# Patient Record
Sex: Male | Born: 1955
Health system: Southern US, Community
[De-identification: ages and names within clinical notes are randomized; demographics above are authoritative.]

## PROBLEM LIST (undated history)

## (undated) DIAGNOSIS — I10 Essential (primary) hypertension: Secondary | ICD-10-CM

## (undated) DIAGNOSIS — F32A Depression, unspecified: Secondary | ICD-10-CM

## (undated) DIAGNOSIS — F1020 Alcohol dependence, uncomplicated: Secondary | ICD-10-CM

## (undated) DIAGNOSIS — E785 Hyperlipidemia, unspecified: Secondary | ICD-10-CM

## (undated) DIAGNOSIS — F419 Anxiety disorder, unspecified: Secondary | ICD-10-CM

## (undated) DIAGNOSIS — M109 Gout, unspecified: Secondary | ICD-10-CM

## (undated) DIAGNOSIS — J449 Chronic obstructive pulmonary disease, unspecified: Secondary | ICD-10-CM

## (undated) HISTORY — DX: Depression, unspecified: F32.A

## (undated) HISTORY — PX: CERVICAL DISC SURGERY: SHX588

## (undated) HISTORY — PX: BACK SURGERY: SHX140

## (undated) HISTORY — DX: Alcohol dependence, uncomplicated: F10.20

## (undated) HISTORY — PX: CARDIAC SURGERY: SHX584

## (undated) HISTORY — DX: Hyperlipidemia, unspecified: E78.5

## (undated) HISTORY — DX: Anxiety disorder, unspecified: F41.9

---

## 2004-07-30 ENCOUNTER — Inpatient Hospital Stay: Payer: Self-pay | Admitting: Internal Medicine

## 2004-07-30 ENCOUNTER — Other Ambulatory Visit: Payer: Self-pay

## 2004-07-31 ENCOUNTER — Other Ambulatory Visit: Payer: Self-pay

## 2004-08-01 ENCOUNTER — Other Ambulatory Visit: Payer: Self-pay

## 2005-09-05 ENCOUNTER — Emergency Department: Payer: Self-pay | Admitting: Emergency Medicine

## 2005-11-30 ENCOUNTER — Other Ambulatory Visit: Payer: Self-pay

## 2005-11-30 ENCOUNTER — Inpatient Hospital Stay: Payer: Self-pay | Admitting: Internal Medicine

## 2005-12-01 ENCOUNTER — Other Ambulatory Visit: Payer: Self-pay

## 2006-07-04 ENCOUNTER — Emergency Department: Payer: Self-pay | Admitting: Emergency Medicine

## 2006-07-26 ENCOUNTER — Ambulatory Visit: Payer: Self-pay | Admitting: Family Medicine

## 2007-02-20 ENCOUNTER — Emergency Department: Payer: Self-pay | Admitting: Emergency Medicine

## 2008-02-23 ENCOUNTER — Emergency Department: Payer: Self-pay | Admitting: Emergency Medicine

## 2009-12-21 DIAGNOSIS — I10 Essential (primary) hypertension: Secondary | ICD-10-CM | POA: Diagnosis present

## 2012-12-07 ENCOUNTER — Emergency Department: Payer: Self-pay | Admitting: Emergency Medicine

## 2012-12-07 LAB — BASIC METABOLIC PANEL
Anion Gap: 8 (ref 7–16)
Calcium, Total: 8.1 mg/dL — ABNORMAL LOW (ref 8.5–10.1)
Chloride: 105 mmol/L (ref 98–107)
Creatinine: 0.99 mg/dL (ref 0.60–1.30)
EGFR (Non-African Amer.): >60
Osmolality: 274 (ref 275–301)
Sodium: 138 mmol/L (ref 136–145)

## 2012-12-07 LAB — CBC
HGB: 14 g/dL (ref 13.0–18.0)
MCH: 34.5 pg — ABNORMAL HIGH (ref 26.0–34.0)
MCV: 100 fL (ref 80–100)
Platelet: 251 10*3/uL (ref 150–440)
RBC: 4.06 10*6/uL — ABNORMAL LOW (ref 4.40–5.90)
WBC: 7.9 10*3/uL (ref 3.8–10.6)

## 2013-05-06 ENCOUNTER — Emergency Department: Payer: Self-pay | Admitting: Emergency Medicine

## 2013-07-09 ENCOUNTER — Emergency Department: Payer: Self-pay | Admitting: Emergency Medicine

## 2013-09-26 ENCOUNTER — Emergency Department: Payer: Self-pay | Admitting: Internal Medicine

## 2015-08-04 ENCOUNTER — Other Ambulatory Visit: Payer: Self-pay | Admitting: Family Medicine

## 2015-08-04 DIAGNOSIS — M549 Dorsalgia, unspecified: Principal | ICD-10-CM

## 2015-08-04 DIAGNOSIS — G8929 Other chronic pain: Secondary | ICD-10-CM

## 2015-08-18 ENCOUNTER — Ambulatory Visit
Admission: RE | Admit: 2015-08-18 | Discharge: 2015-08-18 | Disposition: A | Discharge status: Home / Self Care | Payer: Medicare Other | Source: Ambulatory Visit | Attending: Family Medicine | Admitting: Family Medicine

## 2015-08-18 DIAGNOSIS — M4806 Spinal stenosis, lumbar region: Secondary | ICD-10-CM | POA: Insufficient documentation

## 2015-08-18 DIAGNOSIS — M5126 Other intervertebral disc displacement, lumbar region: Secondary | ICD-10-CM | POA: Diagnosis not present

## 2015-08-18 DIAGNOSIS — M4186 Other forms of scoliosis, lumbar region: Secondary | ICD-10-CM | POA: Insufficient documentation

## 2015-08-18 DIAGNOSIS — G8929 Other chronic pain: Secondary | ICD-10-CM

## 2015-08-18 DIAGNOSIS — M549 Dorsalgia, unspecified: Secondary | ICD-10-CM

## 2015-08-18 DIAGNOSIS — M47816 Spondylosis without myelopathy or radiculopathy, lumbar region: Secondary | ICD-10-CM | POA: Insufficient documentation

## 2015-08-18 DIAGNOSIS — M4856XA Collapsed vertebra, not elsewhere classified, lumbar region, initial encounter for fracture: Secondary | ICD-10-CM | POA: Insufficient documentation

## 2015-08-18 DIAGNOSIS — M545 Low back pain: Secondary | ICD-10-CM | POA: Diagnosis present

## 2015-09-15 ENCOUNTER — Emergency Department
Admission: EM | Admit: 2015-09-15 | Discharge: 2015-09-15 | Disposition: A | Discharge status: Home / Self Care | Payer: Medicare Other | Attending: Emergency Medicine | Admitting: Emergency Medicine

## 2015-09-15 DIAGNOSIS — F17210 Nicotine dependence, cigarettes, uncomplicated: Secondary | ICD-10-CM | POA: Diagnosis not present

## 2015-09-15 DIAGNOSIS — I10 Essential (primary) hypertension: Secondary | ICD-10-CM | POA: Diagnosis not present

## 2015-09-15 DIAGNOSIS — J449 Chronic obstructive pulmonary disease, unspecified: Secondary | ICD-10-CM | POA: Insufficient documentation

## 2015-09-15 DIAGNOSIS — M5440 Lumbago with sciatica, unspecified side: Secondary | ICD-10-CM | POA: Insufficient documentation

## 2015-09-15 DIAGNOSIS — M545 Low back pain: Secondary | ICD-10-CM

## 2015-09-15 HISTORY — DX: Essential (primary) hypertension: I10

## 2015-09-15 HISTORY — DX: Chronic obstructive pulmonary disease, unspecified: J44.9

## 2015-09-15 LAB — BASIC METABOLIC PANEL
ANION GAP: 6 (ref 5–15)
BUN: 10 mg/dL (ref 6–20)
CHLORIDE: 104 mmol/L (ref 101–111)
CO2: 27 mmol/L (ref 22–32)
Calcium: 9.2 mg/dL (ref 8.9–10.3)
Creatinine, Ser: 0.76 mg/dL (ref 0.61–1.24)
GFR calc non Af Amer: >60 mL/min (ref >60)
Glucose, Bld: 133 mg/dL — ABNORMAL HIGH (ref 65–99)
POTASSIUM: 3.8 mmol/L (ref 3.5–5.1)
SODIUM: 137 mmol/L (ref 135–145)

## 2015-09-15 LAB — URINALYSIS COMPLETE WITH MICROSCOPIC (ARMC ONLY)
Bacteria, UA: NONE SEEN
Bilirubin Urine: NEGATIVE
GLUCOSE, UA: NEGATIVE mg/dL
HGB URINE DIPSTICK: NEGATIVE
Ketones, ur: NEGATIVE mg/dL
LEUKOCYTES UA: NEGATIVE
Nitrite: NEGATIVE
PH: 6 (ref 5.0–8.0)
Protein, ur: 30 mg/dL — AB
SPECIFIC GRAVITY, URINE: 1.021 (ref 1.005–1.030)

## 2015-09-15 LAB — CBC
HEMATOCRIT: 51.5 % (ref 40.0–52.0)
HEMOGLOBIN: 17.4 g/dL (ref 13.0–18.0)
MCH: 29.4 pg (ref 26.0–34.0)
MCHC: 33.8 g/dL (ref 32.0–36.0)
MCV: 87 fL (ref 80.0–100.0)
Platelets: 236 10*3/uL (ref 150–440)
RBC: 5.92 MIL/uL — AB (ref 4.40–5.90)
RDW: 16 % — ABNORMAL HIGH (ref 11.5–14.5)
WBC: 7.7 10*3/uL (ref 3.8–10.6)

## 2015-09-15 MED ORDER — KETOROLAC TROMETHAMINE 30 MG/ML IJ SOLN
30.0000 mg | Freq: Once | INTRAMUSCULAR | Status: AC
  Administered 2015-09-15: 30 mg via INTRAMUSCULAR
  Filled 2015-09-15: qty 1

## 2015-09-15 MED ORDER — TRAMADOL HCL 50 MG PO TABS: 50.0000 mg | ORAL_TABLET | Freq: Four times a day (QID) | ORAL | Status: DC | PRN

## 2015-09-15 MED ORDER — LIDOCAINE 5 % EX PTCH
1.0000 | MEDICATED_PATCH | CUTANEOUS | Status: DC
  Administered 2015-09-15: 1 via TRANSDERMAL
  Filled 2015-09-15: qty 1

## 2015-09-15 MED ORDER — LIDOCAINE 5 % EX PTCH: 1.0000 | MEDICATED_PATCH | CUTANEOUS | Status: DC

## 2015-09-15 NOTE — ED Provider Notes (Signed)
Time Seen: Approximately 1730  I have reviewed the triage notes  Chief Complaint: Weakness   History of Present Illness: Aaron Harvey is a 60 y.o. male who presents with acute exacerbation of chronic low back pain. The patient states he's had previous cervical and lumbar surgery. He states the surgery occurred at [facility]. He denies any fever or recent trauma. He states recently he is had an MRI ordered by his primary physician approximately 2 weeks ago and states he hasn't heard of the results at this time. Patient states that he's been out of his medications for the last 4 months though on further questioning states he did not review this with his primary physician a saw just 2 weeks ago. Patient states he is homeless and has "" no place to stay "". He does have family in the area of 2 daughters he states that "" or beverages and will have nothing to do with him "". She said that he's been staying with a friend and recently was in a motel. With his back pain he has been ambulatory and has had normal bowel movements and normal urination. Review of the results of his MRI that he had on 08/17/25 shows extensive lumbar disc disease and arthritic findings with no obvious impingement of significance on the central canal.   Past Medical History  Diagnosis Date  . Hypertension   . COPD (chronic obstructive pulmonary disease) (HCC)     There are no active problems to display for this patient.   Past Surgical History  Procedure Laterality Date  . Back surgery    . Cervical disc surgery    . Cardiac surgery      Past Surgical History  Procedure Laterality Date  . Back surgery    . Cervical disc surgery    . Cardiac surgery      Current Outpatient Rx  Name  Route  Sig  Dispense  Refill  . albuterol (PROAIR HFA) 108 (90 Base) MCG/ACT inhaler   Oral   Take 2 puffs by mouth every 4 (four) hours as needed. Reported on 09/15/2015         . escitalopram (LEXAPRO) 10 MG tablet   Oral   Take  10 mg by mouth daily. Reported on 09/15/2015         . Fluticasone-Salmeterol (ADVAIR DISKUS) 250-50 MCG/DOSE AEPB   Inhalation   Inhale 1 puff into the lungs 2 (two) times daily. Reported on 09/15/2015         . folic acid (FOLVITE) 1 MG tablet   Oral   Take 1 mg by mouth daily. Reported on 09/15/2015         . gabapentin (NEURONTIN) 300 MG capsule   Oral   Take 900 mg by mouth 3 (three) times daily. Reported on 09/15/2015         . lidocaine (LIDODERM) 5 %   Transdermal   Place 1 patch onto the skin daily.   15 patch   0   . lisinopril (PRINIVIL,ZESTRIL) 20 MG tablet   Oral   Take 20 mg by mouth daily. Reported on 09/15/2015         . metoprolol tartrate (LOPRESSOR) 25 MG tablet   Oral   Take 25 mg by mouth 2 (two) times daily. Reported on 09/15/2015         . polyethylene glycol (MIRALAX / GLYCOLAX) packet   Oral   Take 1 packet by mouth 2 (two) times daily. Reported  on 09/15/2015         . pravastatin (PRAVACHOL) 20 MG tablet   Oral   Take 80 mg by mouth daily. Reported on 09/15/2015         . tamsulosin (FLOMAX) 0.4 MG CAPS capsule   Oral   Take 0.4 mg by mouth at bedtime. Reported on 09/15/2015         . thiamine 100 MG tablet   Oral   Take 100 mg by mouth daily. Reported on 09/15/2015         . traMADol (ULTRAM) 50 MG tablet   Oral   Take 1 tablet (50 mg total) by mouth every 6 (six) hours as needed.   20 tablet   0     Allergies:  Review of patient's allergies indicates no known allergies.  Family History: No family history on file.  Social History: Social History  Substance Use Topics  . Smoking status: Current Every Day Smoker    Types: Cigarettes  . Smokeless tobacco: None  . Alcohol Use: No     Review of Systems:   10 point review of systems was performed and was otherwise negative:  Constitutional: No fever Eyes: No visual disturbances ENT: No sore throat, ear pain Cardiac: No chest pain Respiratory: No shortness of  breath, wheezing, or stridor Abdomen: No abdominal pain, no vomiting, No diarrhea Endocrine: No weight loss, No night sweats Extremities: No peripheral edema, cyanosis Skin: No rashes, easy bruising Neurologic: No focal weakness, trouble with speech or swollowing Urologic: No dysuria, Hematuria, or urinary frequency   Physical Exam:  ED Triage Vitals  Enc Vitals Group     BP 09/15/15 1438 156/88 mmHg     Pulse Rate 09/15/15 1438 86     Resp 09/15/15 1438 18     Temp 09/15/15 1438 98.2 F (36.8 C)     Temp Source 09/15/15 1438 Oral     SpO2 09/15/15 1438 97 %     Weight 09/15/15 1438 180 lb (81.647 kg)     Height 09/15/15 1438  (1.727 m)     Head Cir --      Peak Flow --      Pain Score 09/15/15 1439 9     Pain Loc --      Pain Edu? --      Excl. in GC? --     General: Awake , Alert , and Oriented times 3; GCS 15 Head: Normal cephalic , atraumatic Eyes: Pupils equal , round, reactive to light Nose/Throat: No nasal drainage, patent upper airway without erythema or exudate.  Neck: Supple, Full range of motion, No anterior adenopathy or palpable thyroid masses Lungs: Clear to ascultation without wheezes , rhonchi, or rales Heart: Regular rate, regular rhythm without murmurs , gallops , or rubs Abdomen: Soft, non tender without rebound, guarding , or rigidity; bowel sounds positive and symmetric in all 4 quadrants. No organomegaly .        Extremities: 2 plus symmetric pulses. No edema, clubbing or cyanosis Neurologic: normal ambulation, Motor symmetric without deficits, sensory intact Skin: warm, dry, no rashes   Labs:   All laboratory work was reviewed including any pertinent negatives or positives listed below:  Labs Reviewed  BASIC METABOLIC PANEL - Abnormal; Notable for the following:    Glucose, Bld 133 (*)    All other components within normal limits  CBC - Abnormal; Notable for the following:    RBC 5.92 (*)    RDW  16.0 (*)    All other components within  normal limits  URINALYSIS COMPLETEWITH MICROSCOPIC ([facility] ONLY) - Abnormal; Notable for the following:    Color, Urine YELLOW (*)    APPearance CLEAR (*)    Protein, ur 30 (*)    Squamous Epithelial / LPF 0-5 (*)    All other components within normal limits  Laboratory work was reviewed and showed no clinically significant abnormalities.       ED Course:  The patient was advised we cannot admit him to the hospital simply because he is homeless and requesting rehabilitation services. Patient seemed very upset that he did not meet the requirements to be admitted to the hospital. He does not appear to have any acute cauda equina syndrome. And the patient has not attempted any significant outpatient pain relief. The patient seems very frustrated and advised him that we would have social work see him and give him options in the area. We also supplied him with a prescription for lidocaine patches and Ultram to take on an outpatient basis. Have a primary physician and his surgery was a [facility] and he was advised that he can always seek those locations for further outpatient treatment.    Assessment:  Acute exacerbation of chronic low back pain Lumbar disc disease Poor social situation   Final Clinical Impression:   Final diagnoses:  Midline low back pain, with sciatica presence unspecified     Plan: * Outpatient Patient was advised to return immediately if condition worsens. Patient was advised to follow up with their primary care physician or other specialized physicians involved in their outpatient care. The patient and/or family member/power of attorney had laboratory results reviewed at the bedside. All questions and concerns were addressed and appropriate discharge instructions were distributed by the nursing staff.            Jennye MoccasinBrian S Quigley, MD 09/15/15 [phone]

## 2015-09-15 NOTE — Progress Notes (Signed)
CSW contacted pt's daughter [phone] and informed her that pt would likely be d/c and does not have anywhere to go. Pt's daughter requested that she speak with pt. CSW gave pt's daughter ED phone number. CSW signing off.  Jonathon JordanLynn B Jariah Jarmon, MSW, Theresia MajorsLCSWA [phone]

## 2015-09-15 NOTE — Discharge Instructions (Signed)

## 2015-09-15 NOTE — ED Notes (Signed)
Pt states he needs help, states he is having increased weakness, has to use a walker to ambulate, states he is homeless.. Pt daughter dropped him off.. Pt states he has not had he daily meds in 4 months..Marland Kitchen

## 2015-09-15 NOTE — Clinical Social Work Note (Signed)
Clinical Social Work Assessment  Patient Details  Name: Aaron Harvey MRN: [id number] Date of Birth: 02/24/1956  Date of referral:  09/15/15               Reason for consult:  Housing Concerns/Homelessness, Facility Placement                Permission sought to share information with:    Permission granted to share information::  Yes, Verbal Permission Granted  Name::        Agency::     Relationship::     Contact Information:     Housing/Transportation Living arrangements for the past 2 months:  Homeless Source of Information:  Patient Patient Interpreter Needed:  None Criminal Activity/Legal Involvement Pertinent to Current Situation/Hospitalization:  No - Comment as needed Significant Relationships:  Adult Children Lives with:    Do you feel safe going back to the place where you live?  No (Homeless) Need for family participation in patient care:  No (Coment)  Care giving concerns:  No care giving concerns present at this time.   Social Worker assessment / plan: CSW received consult for homelessness and possible inpt rehab placement. CSW engaged with pt at his bedside and introduced herself and her role as a Child psychotherapistsocial worker. Pt was previously living with family members but states that he became a "burden" so it did not work out. Pt has been homeless for about 2 months. Pt is interested in inpt rehab however, resources are limited as it is after-hours for most facilities and pt is not being admitted or staying overnight. Pt was very tearful and hopeless during the interaction and mentioned several times that he had nothing and nowhere to go. CSW provided pt with resources for local shelters, boarding houses, free meals, and provided pt with 2 bus passes. Pt states that he cannot read or write so CSW highlighted the numbers of the shelters. Pt states that he knows where the shelters are. MD mentioned that pt may be able to receive more help if he returns to the ED during the day. CSW  mentioned this to the pt and pt was agreeable. CSW provided pt with emotional support and supportive counseling. Pt stated that he hasn't ate in days so CSW provided pt with snacks. No further social work needs at this time. CSW signing off.  Employment status:  Disabled (Comment on whether or not currently receiving Disability) (Receives disability check) Insurance information:  Medicare, [place] PT Recommendations:  Inpatient Rehab Consult Information / Referral to community resources:  Shelter  Patient/Family's Response to care:  Pt is appreciative of the care that he has received at this time.  Patient/Family's Understanding of and Emotional Response to Diagnosis, Current Treatment, and Prognosis: Pt demonstrates a clear understanding of his current diagnosis.   Emotional Assessment Appearance:  Appears stated age Attitude/Demeanor/Rapport:  Other, Crying (Cooperative) Affect (typically observed):  Anxious, Sad, Overwhelmed, Tearful/Crying Orientation:  Oriented to Self, Oriented to Place, Oriented to  Time, Oriented to Situation Alcohol / Substance use:  Not Applicable Psych involvement (Current and /or in the community):  No (Comment)  Discharge Needs  Concerns to be addressed:  Homelessness Readmission within the last 30 days:  No Current discharge risk:  Homeless Barriers to Discharge:  Unsafe home situation   Aaron Harvey, LCSWA 09/15/2015, 6:27 PM

## 2015-11-26 ENCOUNTER — Other Ambulatory Visit: Payer: Self-pay | Admitting: Neurosurgery

## 2015-11-26 DIAGNOSIS — M542 Cervicalgia: Secondary | ICD-10-CM

## 2015-12-10 ENCOUNTER — Other Ambulatory Visit: Payer: Medicare Other

## 2015-12-10 ENCOUNTER — Inpatient Hospital Stay
Admission: RE | Admit: 2015-12-10 | Discharge: 2015-12-10 | Disposition: A | Discharge status: Home / Self Care | Payer: Medicare Other | Source: Ambulatory Visit | Attending: Neurosurgery | Admitting: Neurosurgery

## 2015-12-10 NOTE — Discharge Instructions (Signed)

## 2015-12-24 ENCOUNTER — Ambulatory Visit
Admission: RE | Admit: 2015-12-24 | Discharge: 2015-12-24 | Disposition: A | Discharge status: Home / Self Care | Payer: Medicare Other | Source: Ambulatory Visit | Attending: Neurosurgery | Admitting: Neurosurgery

## 2015-12-24 DIAGNOSIS — M542 Cervicalgia: Secondary | ICD-10-CM

## 2015-12-24 MED ORDER — DIAZEPAM 5 MG PO TABS
10.0000 mg | ORAL_TABLET | Freq: Once | ORAL | Status: AC
  Administered 2015-12-24: 10 mg via ORAL

## 2015-12-24 MED ORDER — IOPAMIDOL (ISOVUE-M 300) INJECTION 61%
10.0000 mL | Freq: Once | INTRAMUSCULAR | Status: AC | PRN
  Administered 2015-12-24: 10 mL via INTRATHECAL

## 2015-12-24 MED ORDER — ONDANSETRON HCL 4 MG/2ML IJ SOLN: 4.0000 mg | Freq: Four times a day (QID) | INTRAMUSCULAR | Status: DC | PRN

## 2015-12-24 MED ORDER — HYDROXYZINE HCL 50 MG/ML IM SOLN
25.0000 mg | Freq: Once | INTRAMUSCULAR | Status: AC
  Administered 2015-12-24: 25 mg via INTRAMUSCULAR

## 2015-12-24 MED ORDER — MEPERIDINE HCL 100 MG/ML IJ SOLN
100.0000 mg | Freq: Once | INTRAMUSCULAR | Status: AC
  Administered 2015-12-24: 100 mg via INTRAMUSCULAR

## 2015-12-24 NOTE — Discharge Instructions (Signed)

## 2015-12-27 ENCOUNTER — Telehealth: Payer: Self-pay

## 2015-12-27 NOTE — Telephone Encounter (Signed)
Spoke with Aaron Harvey at the household to see how Aaron Harvey is doing after his myelogram here 12/24/15.  He states Aaron Harvey is doing well; no headache.  jkl

## 2018-09-19 ENCOUNTER — Emergency Department: Payer: Medicare Other

## 2018-09-19 ENCOUNTER — Other Ambulatory Visit: Payer: Self-pay

## 2018-09-19 ENCOUNTER — Emergency Department
Admission: EM | Admit: 2018-09-19 | Discharge: 2018-09-19 | Disposition: A | Discharge status: Home / Self Care | Payer: Medicare Other | Attending: Emergency Medicine | Admitting: Emergency Medicine

## 2018-09-19 DIAGNOSIS — R569 Unspecified convulsions: Secondary | ICD-10-CM | POA: Insufficient documentation

## 2018-09-19 DIAGNOSIS — J449 Chronic obstructive pulmonary disease, unspecified: Secondary | ICD-10-CM | POA: Insufficient documentation

## 2018-09-19 DIAGNOSIS — F17210 Nicotine dependence, cigarettes, uncomplicated: Secondary | ICD-10-CM | POA: Insufficient documentation

## 2018-09-19 DIAGNOSIS — Z79899 Other long term (current) drug therapy: Secondary | ICD-10-CM | POA: Diagnosis not present

## 2018-09-19 DIAGNOSIS — I10 Essential (primary) hypertension: Secondary | ICD-10-CM | POA: Insufficient documentation

## 2018-09-19 DIAGNOSIS — Z7982 Long term (current) use of aspirin: Secondary | ICD-10-CM | POA: Diagnosis not present

## 2018-09-19 LAB — BASIC METABOLIC PANEL
Anion gap: 12 (ref 5–15)
BUN: 10 mg/dL (ref 8–23)
CO2: 24 mmol/L (ref 22–32)
Calcium: 8.8 mg/dL — ABNORMAL LOW (ref 8.9–10.3)
Chloride: 95 mmol/L — ABNORMAL LOW (ref 98–111)
Creatinine, Ser: 0.99 mg/dL (ref 0.61–1.24)
GFR calc Af Amer: >60 mL/min (ref >60)
GFR calc non Af Amer: >60 mL/min (ref >60)
Glucose, Bld: 146 mg/dL — ABNORMAL HIGH (ref 70–99)
Potassium: 3 mmol/L — ABNORMAL LOW (ref 3.5–5.1)
Sodium: 131 mmol/L — ABNORMAL LOW (ref 135–145)

## 2018-09-19 LAB — CBC WITH DIFFERENTIAL/PLATELET
Abs Immature Granulocytes: 0.03 10*3/uL (ref 0.00–0.07)
Basophils Absolute: 0 10*3/uL (ref 0.0–0.1)
Basophils Relative: 0 %
Eosinophils Absolute: 0 10*3/uL (ref 0.0–0.5)
Eosinophils Relative: 0 %
HCT: 44.7 % (ref 39.0–52.0)
Hemoglobin: 14.4 g/dL (ref 13.0–17.0)
Immature Granulocytes: 1 %
Lymphocytes Relative: 15 %
Lymphs Abs: 0.9 10*3/uL (ref 0.7–4.0)
MCH: 27.1 pg (ref 26.0–34.0)
MCHC: 32.2 g/dL (ref 30.0–36.0)
MCV: 84.2 fL (ref 80.0–100.0)
Monocytes Absolute: 0.4 10*3/uL (ref 0.1–1.0)
Monocytes Relative: 7 %
Neutro Abs: 4.9 10*3/uL (ref 1.7–7.7)
Neutrophils Relative %: 77 %
Platelets: 126 10*3/uL — ABNORMAL LOW (ref 150–400)
RBC: 5.31 MIL/uL (ref 4.22–5.81)
RDW: 18.6 % — ABNORMAL HIGH (ref 11.5–15.5)
WBC: 6.3 10*3/uL (ref 4.0–10.5)
nRBC: 0 % (ref 0.0–0.2)

## 2018-09-19 LAB — URINE DRUG SCREEN, QUALITATIVE (ARMC ONLY)
Amphetamines, Ur Screen: POSITIVE — AB
Barbiturates, Ur Screen: NOT DETECTED
Benzodiazepine, Ur Scrn: NOT DETECTED
Cannabinoid 50 Ng, Ur ~~LOC~~: POSITIVE — AB
Cocaine Metabolite,Ur ~~LOC~~: NOT DETECTED
MDMA (Ecstasy)Ur Screen: NOT DETECTED
Methadone Scn, Ur: NOT DETECTED
Opiate, Ur Screen: NOT DETECTED
Phencyclidine (PCP) Ur S: NOT DETECTED
Tricyclic, Ur Screen: NOT DETECTED

## 2018-09-19 LAB — ETHANOL: Alcohol, Ethyl (B): <10 mg/dL (ref <10)

## 2018-09-19 LAB — LACTIC ACID, PLASMA: Lactic Acid, Venous: 2.2 mmol/L (ref 0.5–1.9)

## 2018-09-19 MED ORDER — SODIUM CHLORIDE 0.9 % IV BOLUS
1000.0000 mL | Freq: Once | INTRAVENOUS | Status: AC
  Administered 2018-09-19: 1000 mL via INTRAVENOUS

## 2018-09-19 MED ORDER — POTASSIUM CHLORIDE CRYS ER 20 MEQ PO TBCR
40.0000 meq | EXTENDED_RELEASE_TABLET | Freq: Once | ORAL | Status: AC
  Administered 2018-09-19: 23:00:00 40 meq via ORAL
  Filled 2018-09-19: qty 2

## 2018-09-19 NOTE — ED Triage Notes (Addendum)
Pt to ED via EMS from home. Per ems pts brother reports possible seizure like activity. Pt was oriented to person only on ems arrival to home and pt was lethargic. No hx of seizures. Pt is now a&o x4. Pt non ambulatory at baseline. Pt denies alcohol and drug use. Pt states hes been having sinus issues.

## 2018-09-19 NOTE — ED Notes (Signed)
Family called to come pick up pt. Aaron Harvey stated family will be here soon.

## 2018-09-19 NOTE — Discharge Instructions (Addendum)
As we discussed please do not drive, get up on a ladder, go in a pool or do anything that could be dangerous for you or others if you were to have another seizure. Please seek medical attention for any high fevers, chest pain, shortness of breath, change in behavior, persistent vomiting, bloody stool or any other new or concerning symptoms.

## 2018-09-19 NOTE — ED Notes (Signed)
Date and time results received: 09/19/18 9:04 PM  Test: lactic acid Critical Value: 2.2  Name of Provider Notified: Dr. Archie Balboa  Orders Received? Or Actions Taken?: no new orders at this time

## 2018-09-19 NOTE — ED Provider Notes (Signed)
[facility] Emergency Department Provider Note   ____________________________________________   I have reviewed the triage vital signs and the nursing notes.   HISTORY  Chief Complaint Seizures   History limited by: Confusion   HPI Aaron Harvey is a 63 y.o. male who presents to the emergency department today after an apparent seizure.  The patient himself is unsure exactly what happened.  He states he started feeling as if he was going to go out but then is unsure what happened.  Patient states that he does have a headache.  He denies any extremity pain.  Denies any history of seizures.  States he last drank alcohol 2 weeks ago.  Denies any history of trauma.    Records reviewed. Per medical record review patient has a history of COPD, HTN.   Past Medical History:  Diagnosis Date  . COPD (chronic obstructive pulmonary disease) ([place])   . Hypertension     There are no active problems to display for this patient.   Past Surgical History:  Procedure Laterality Date  . BACK SURGERY    . CARDIAC SURGERY    . CERVICAL DISC SURGERY      Prior to Admission medications   Medication Sig Start Date End Date Taking? Authorizing Provider  albuterol (PROAIR HFA) 108 (90 Base) MCG/ACT inhaler Take 2 puffs by mouth every 4 (four) hours as needed. Reported on 09/15/2015 10/24/10   [provider]  aspirin 81 MG chewable tablet Chew by mouth daily.    [provider]  gabapentin (NEURONTIN) 300 MG capsule Take 900 mg by mouth 3 (three) times daily. Reported on 09/15/2015 12/10/14   [provider]  metoprolol tartrate (LOPRESSOR) 25 MG tablet Take 25 mg by mouth 2 (two) times daily. Reported on 09/15/2015 10/24/10   [provider]  pravastatin (PRAVACHOL) 20 MG tablet Take 80 mg by mouth daily. Reported on 09/15/2015 10/24/10   [provider]    Allergies Patient has no known allergies.  No family history on  file.  Social History Social History   Tobacco Use  . Smoking status: Current Every Day Smoker    Types: Cigarettes  . Smokeless tobacco: Never Used  Substance Use Topics  . Alcohol use: No  . Drug use: Yes    Types: Marijuana    Review of Systems Constitutional: No fever/chills Eyes: No visual changes. ENT: No sore throat. Cardiovascular: Denies chest pain. Respiratory: Denies shortness of breath. Gastrointestinal: No abdominal pain.  No nausea, no vomiting.  No diarrhea.   Genitourinary: Negative for dysuria. Musculoskeletal: Negative for back pain. Skin: Negative for rash. Neurological: Positive for headache.  ____________________________________________   PHYSICAL EXAM:  VITAL SIGNS: ED Triage Vitals  Enc Vitals Group     BP 09/19/18 2021 128/86     Pulse Rate 09/19/18 2021 (!) 120     Resp 09/19/18 2021 20     Temp 09/19/18 2021 99.6 F (37.6 C)     Temp Source 09/19/18 2021 Oral     SpO2 09/19/18 2021 91 %     Weight 09/19/18 2019 180 lb (81.6 kg)     Height 09/19/18 2019 5\' 8"  (1.727 m)     Head Circumference --      Peak Flow --      Pain Score 09/19/18 2019 0   Constitutional: Awake and alert. Not completely oriented to events.  Eyes: Conjunctivae are normal.  ENT      Head: Normocephalic and atraumatic.  Nose: No congestion/rhinnorhea.      Mouth/Throat: Mucous membranes are moist.      Neck: No stridor. Hematological/Lymphatic/Immunilogical: No cervical lymphadenopathy. Cardiovascular: Normal rate, regular rhythm.  No murmurs, rubs, or gallops.  Respiratory: Normal respiratory effort without tachypnea nor retractions. Breath sounds are clear and equal bilaterally. No wheezes/rales/rhonchi. Gastrointestinal: Soft and non tender. No rebound. No guarding.  Genitourinary: Deferred Musculoskeletal: Normal range of motion in all extremities. No lower extremity edema. Neurologic:  Not completely oriented. Moving all extremities. Sensation intact.   Skin:  Skin is warm, dry and intact. No rash noted. Psychiatric: Mood and affect are normal. Speech and behavior are normal. Patient exhibits appropriate insight and judgment.  ____________________________________________    LABS (pertinent positives/negatives)  Lactic acid 2.2 UDS positive amphetamines, cannabinoid BMP na 131, k 3.0, glu 146, cr 0.99 CBC wbc 6.3, hgb 14.4, plt 126  ____________________________________________   EKG  I, Phineas SemenGraydon Khloee Garza, attending physician, personally viewed and interpreted this EKG  EKG Time: 2021 Rate: 122 Rhythm: sinus tachycardia Axis: normal Intervals: qtc 469 QRS: narrow ST changes: no st elevation Impression: abnormal ekg  ____________________________________________    RADIOLOGY   CT head No acute abnormality  ____________________________________________   PROCEDURES  Procedures  ____________________________________________   INITIAL IMPRESSION / ASSESSMENT AND PLAN / ED COURSE  Pertinent labs & imaging results that were available during my care of the patient were reviewed by me and considered in my medical decision making (see chart for details).   Patient presented to the emergency department today because of concern for seizure type episode.  Patient does not recall what happened.  Initial exam patient appears somewhat postictal.  Did appear to improve during his stay in the emergency department.  Blood work was some slight hypokalemia and hyponatremia although I doubt these would be significant enough to cause a seizure type episode.  UDS positive for cannabinoid and amphetamines.  CT head negative.  I did discussion with patient at seizure precautions.  Discussed importance of following up with neurology. ____________________________________________   FINAL CLINICAL IMPRESSION(S) / ED DIAGNOSES  Final diagnoses:  Seizure-like activity (HCC)     Note: This dictation was prepared with Dragon dictation. Any  transcriptional errors that result from this process are unintentional     Phineas SemenGoodman, Beckett Maden, MD 09/19/18 [phone]

## 2018-09-19 NOTE — ED Notes (Signed)
Topaz frozen so pt signed printed d/c paperwork.

## 2018-09-20 LAB — BLOOD CULTURE ID PANEL (REFLEXED)

## 2018-09-22 LAB — CULTURE, BLOOD (ROUTINE X 2)

## 2018-09-23 NOTE — Progress Notes (Addendum)
1/4 bottles viridans streptococcus, likely contaminant, but discussed with Dr. Marjean Donna. MD would patient to come back to the ED for repeat blood draw.   Called the patient twice and was told that he was sleeping.   Called 09/24/18 @ 1210 and LVM    Update on 7/21 @ 1406: Called patient and discussed lab results. He denies any fever or chills and reports that he feels "fine and about the same as he previously did". He reports that he will come to the ED some time this week- pending access to transportation.   Thank you for allowing pharmacy to be a part of this patient's care.   Kristeen Miss, PharmD Clinical Pharmacist

## 2018-09-23 NOTE — Progress Notes (Signed)
PHARMACY - PHYSICIAN COMMUNICATION CRITICAL VALUE ALERT - BLOOD CULTURE IDENTIFICATION (BCID)  Aaron Harvey is an 63 y.o. male who presented to [facility] on 09/19/2018 with seizures  Assessment: 1/4 bottles viridans streptococcus, likely contaminant  Name of physician (or Provider) Contacted: Dr. Archie Balboa  Current antibiotics: none  Changes to prescribed antibiotics recommended: none  Results for orders placed or performed during the hospital encounter of 09/19/18  Blood Culture ID Panel (Reflexed) (Collected: 09/19/2018  8:36 PM)  Result Value Ref Range   Enterococcus species NOT DETECTED NOT DETECTED   Listeria monocytogenes NOT DETECTED NOT DETECTED   Staphylococcus species NOT DETECTED NOT DETECTED   Staphylococcus aureus (BCID) NOT DETECTED NOT DETECTED   Streptococcus species DETECTED (A) NOT DETECTED   Streptococcus agalactiae NOT DETECTED NOT DETECTED   Streptococcus pneumoniae NOT DETECTED NOT DETECTED   Streptococcus pyogenes NOT DETECTED NOT DETECTED   Acinetobacter baumannii NOT DETECTED NOT DETECTED   Enterobacteriaceae species NOT DETECTED NOT DETECTED   Enterobacter cloacae complex NOT DETECTED NOT DETECTED   Escherichia coli NOT DETECTED NOT DETECTED   Klebsiella oxytoca NOT DETECTED NOT DETECTED   Klebsiella pneumoniae NOT DETECTED NOT DETECTED   Proteus species NOT DETECTED NOT DETECTED   Serratia marcescens NOT DETECTED NOT DETECTED   Haemophilus influenzae NOT DETECTED NOT DETECTED   Neisseria meningitidis NOT DETECTED NOT DETECTED   Pseudomonas aeruginosa NOT DETECTED NOT DETECTED   Candida albicans NOT DETECTED NOT DETECTED   Candida glabrata NOT DETECTED NOT DETECTED   Candida krusei NOT DETECTED NOT DETECTED   Candida parapsilosis NOT DETECTED NOT DETECTED   Candida tropicalis NOT DETECTED NOT DETECTED    Tawnya Crook, PharmD 09/23/2018  3:21 PM

## 2018-09-24 LAB — CULTURE, BLOOD (ROUTINE X 2): Culture: NO GROWTH

## 2018-09-30 ENCOUNTER — Other Ambulatory Visit: Payer: Self-pay

## 2018-09-30 ENCOUNTER — Encounter: Payer: Self-pay | Admitting: Emergency Medicine

## 2018-09-30 ENCOUNTER — Emergency Department
Admission: EM | Admit: 2018-09-30 | Discharge: 2018-10-01 | Disposition: A | Discharge status: Home / Self Care | Payer: Medicare Other | Attending: Emergency Medicine | Admitting: Emergency Medicine

## 2018-09-30 DIAGNOSIS — F17210 Nicotine dependence, cigarettes, uncomplicated: Secondary | ICD-10-CM | POA: Diagnosis not present

## 2018-09-30 DIAGNOSIS — I10 Essential (primary) hypertension: Secondary | ICD-10-CM | POA: Diagnosis not present

## 2018-09-30 DIAGNOSIS — Z7982 Long term (current) use of aspirin: Secondary | ICD-10-CM | POA: Diagnosis not present

## 2018-09-30 DIAGNOSIS — R7881 Bacteremia: Secondary | ICD-10-CM | POA: Diagnosis not present

## 2018-09-30 DIAGNOSIS — J449 Chronic obstructive pulmonary disease, unspecified: Secondary | ICD-10-CM | POA: Diagnosis not present

## 2018-09-30 DIAGNOSIS — I2510 Atherosclerotic heart disease of native coronary artery without angina pectoris: Secondary | ICD-10-CM | POA: Insufficient documentation

## 2018-09-30 DIAGNOSIS — Z79899 Other long term (current) drug therapy: Secondary | ICD-10-CM | POA: Insufficient documentation

## 2018-09-30 NOTE — ED Triage Notes (Signed)
Pt reports he was called by hospital last on 7-17 and told to come in due to positive blood culture containing VIRIDANS STREPTOCOCCUS. Pt reports he came today to have that treatment. Archie Balboa, MD seen pt and was consulted on today's visit as well as recommended protocols.

## 2018-09-30 NOTE — ED Provider Notes (Signed)
[facility] Emergency Department Provider Note  ____________________________________________  Time seen: Approximately 11:52 PM  I have reviewed the triage vital signs and the nursing notes.   HISTORY  Chief Complaint Blood Infection   HPI Aaron Harvey is a 63 y.o. male with history of COPD, CAD, hypertension who presents for positive blood cultures.  Patient was seen here 11 days ago for a loss of consciousness and questionable seizure episode.  He had labs done including blood cultures and 1 out of 4 bottles grew viridans Streptococcus.  Our pharmacist has been trying for the last 11 days to get a hold of the patient asking for him to come back to the emergency room.  He was finally able to reach the patient reports feeling well with no fevers but agreed to present back to the emergency room.  Patient reports that he has been feeling very fatigued since his last visit 11 days ago but has had no fevers, no chills, no unintentional weight loss, no neck stiffness, no changes in his chronic cough, no shortness of breath, no chest pain, no abdominal pain, no vomiting, no diarrhea, no dysuria or frequency.   Past Medical History:  Diagnosis Date  . COPD (chronic obstructive pulmonary disease) ([place])   . Hypertension     Past Surgical History:  Procedure Laterality Date  . BACK SURGERY    . CARDIAC SURGERY    . CERVICAL DISC SURGERY      Prior to Admission medications   Medication Sig Start Date End Date Taking? Authorizing Provider  albuterol (PROAIR HFA) 108 (90 Base) MCG/ACT inhaler Take 2 puffs by mouth every 4 (four) hours as needed. Reported on 09/15/2015 10/24/10   [provider]  aspirin 81 MG chewable tablet Chew by mouth daily.    [provider]  COLCRYS 0.6 MG tablet Take 0.6 mg by mouth daily.  08/29/18   [provider]  gabapentin (NEURONTIN) 300 MG capsule Take 900 mg by mouth 3 (three) times daily. Reported on  09/15/2015 12/10/14   [provider]  metoprolol tartrate (LOPRESSOR) 25 MG tablet Take 25 mg by mouth 2 (two) times daily. Reported on 09/15/2015 10/24/10   [provider]  omeprazole (PRILOSEC) 40 MG capsule Take 40 mg by mouth daily. 07/30/18   [provider]  PARoxetine (PAXIL) 10 MG tablet Take 10 mg by mouth daily. 08/29/18   [provider]  pravastatin (PRAVACHOL) 20 MG tablet Take 80 mg by mouth daily. Reported on 09/15/2015 10/24/10   [provider]  TRELEGY ELLIPTA 100-62.5-25 MCG/INH AEPB Inhale 2 puffs into the lungs daily.  08/29/18   [provider]    Allergies Patient has no known allergies.  History reviewed. No pertinent family history.  Social History Social History   Tobacco Use  . Smoking status: Current Every Day Smoker    Types: Cigarettes  . Smokeless tobacco: Never Used  Substance Use Topics  . Alcohol use: No  . Drug use: Yes    Types: Marijuana    Review of Systems  Constitutional: Negative for fever. + fatigue Eyes: Negative for visual changes. ENT: Negative for sore throat. Neck: No neck pain  Cardiovascular: Negative for chest pain. Respiratory: Negative for shortness of breath. Gastrointestinal: Negative for abdominal pain, vomiting or diarrhea. Genitourinary: Negative for dysuria. Musculoskeletal: Negative for back pain. Skin: Negative for rash. Neurological: Negative for headaches, weakness or numbness. Psych: No SI or HI  ____________________________________________   PHYSICAL EXAM:  VITAL SIGNS: ED Triage Vitals [09/30/18 2104]  Enc Vitals Group     BP 124/77     Pulse Rate 74     Resp 17     Temp 98.3 F (36.8 C)     Temp Source Oral     SpO2 99 %     Weight      Height      Head Circumference      Peak Flow      Pain Score      Pain Loc      Pain Edu?      Excl. in GC?     Constitutional: Alert and oriented. Well appearing and in no apparent distress. HEENT:       Head: Normocephalic and atraumatic.         Eyes: Conjunctivae are normal. Sclera is non-icteric.       Mouth/Throat: Mucous membranes are moist.       Neck: Supple with no signs of meningismus. Cardiovascular: Regular rate and rhythm. No murmurs, gallops, or rubs. 2+ symmetrical distal pulses are present in all extremities. No JVD. Respiratory: Normal respiratory effort. Lungs are clear to auscultation bilaterally. No wheezes, crackles, or rhonchi.  Gastrointestinal: Soft, non tender, and non distended with positive bowel sounds. No rebound or guarding. Musculoskeletal: Nontender with normal range of motion in all extremities. No edema, cyanosis, or erythema of extremities. Neurologic: Normal speech and language. Face is symmetric. Moving all extremities. No gross focal neurologic deficits are appreciated. Skin: Skin is warm, dry and intact. No rash noted. Psychiatric: Mood and affect are normal. Speech and behavior are normal.  ____________________________________________   LABS (all labs ordered are listed, but only abnormal results are displayed)  Labs Reviewed  CBC WITH DIFFERENTIAL/PLATELET - Abnormal; Notable for the following components:      Result Value   RDW 18.4 (*)    All other components within normal limits  COMPREHENSIVE METABOLIC PANEL - Abnormal; Notable for the following components:   Chloride 96 (*)    Glucose, Bld 109 (*)    Total Protein 8.3 (*)    All other components within normal limits  URINALYSIS, COMPLETE (UACMP) WITH MICROSCOPIC - Abnormal; Notable for the following components:   Color, Urine AMBER (*)    APPearance CLEAR (*)    All other components within normal limits  CULTURE, BLOOD (ROUTINE X 2)  CULTURE, BLOOD (ROUTINE X 2)  URINE CULTURE  LACTIC ACID, PLASMA   ____________________________________________  EKG  none  ____________________________________________  RADIOLOGY  none  ____________________________________________    PROCEDURES  Procedure(s) performed: None Procedures Critical Care performed:  None ____________________________________________   INITIAL IMPRESSION / ASSESSMENT AND PLAN / ED COURSE  63 y.o. male with history of COPD, CAD, hypertension who presents for positive blood culture 1/4 tubes 11 days ago. Patient with no infectious symptoms per ROS. Normal exam. No meningeal signs. Normal vitals with no fever or tachycardia. Most likely a contaminant. Will recheck labs and resend blood culture.   Clinical Course as of Sep 30 201  Tue Oct 01, 2018  0202 Patient remains afebrile, no tachycardia, UA negative for UTI, normal lactic acid, normal white count.  Repeat blood cultures were sent.  At this time low suspicion for bacteremia given the positive blood culture was more than 10 days ago and patient is completely asymptomatic with normal vitals and labs.  Patient be discharged home on supportive care follow-up with primary care doctor.  Discussed my standard return precautions   [  CV]    Clinical Course User Index [CV] Don PerkingVeronese, [place], MD      As part of my medical decision making, I reviewed the following data within the electronic MEDICAL RECORD NUMBER Nursing notes reviewed and incorporated, Labs reviewed , Old chart reviewed, Notes from prior ED visits and [place] Controlled Substance Database   Patient was evaluated in Emergency Department today for the symptoms described in the history of present illness. Patient was evaluated in the context of the global COVID-19 pandemic, which necessitated consideration that the patient might be at risk for infection with the SARS-CoV-2 virus that causes COVID-19. Institutional protocols and algorithms that pertain to the evaluation of patients at risk for COVID-19 are in a state of rapid change based on information released by regulatory bodies including the CDC and federal and state organizations. These policies and algorithms were followed during the  patient's care in the ED.   ____________________________________________   FINAL CLINICAL IMPRESSION(S) / ED DIAGNOSES   Final diagnoses:  Positive blood culture      NEW MEDICATIONS STARTED DURING THIS VISIT:  ED Discharge Orders    None       Note:  This document was prepared using Dragon voice recognition software and may include unintentional dictation errors.    Don PerkingVeronese, [place], MD 10/01/18 [phone]

## 2018-10-01 DIAGNOSIS — R7881 Bacteremia: Secondary | ICD-10-CM | POA: Diagnosis not present

## 2018-10-01 LAB — URINALYSIS, COMPLETE (UACMP) WITH MICROSCOPIC
Bacteria, UA: NONE SEEN
Bilirubin Urine: NEGATIVE
Glucose, UA: NEGATIVE mg/dL
Hgb urine dipstick: NEGATIVE
Ketones, ur: NEGATIVE mg/dL
Leukocytes,Ua: NEGATIVE
Nitrite: NEGATIVE
Protein, ur: NEGATIVE mg/dL
Specific Gravity, Urine: 1.014 (ref 1.005–1.030)
Squamous Epithelial / HPF: NONE SEEN (ref 0–5)
pH: 6 (ref 5.0–8.0)

## 2018-10-01 LAB — CBC WITH DIFFERENTIAL/PLATELET
Abs Immature Granulocytes: 0.02 10*3/uL (ref 0.00–0.07)
Basophils Absolute: 0 10*3/uL (ref 0.0–0.1)
Basophils Relative: 1 %
Eosinophils Absolute: 0.1 10*3/uL (ref 0.0–0.5)
Eosinophils Relative: 2 %
HCT: 46.7 % (ref 39.0–52.0)
Hemoglobin: 14.3 g/dL (ref 13.0–17.0)
Immature Granulocytes: 0 %
Lymphocytes Relative: 31 %
Lymphs Abs: 1.9 10*3/uL (ref 0.7–4.0)
MCH: 27.3 pg (ref 26.0–34.0)
MCHC: 30.6 g/dL (ref 30.0–36.0)
MCV: 89.1 fL (ref 80.0–100.0)
Monocytes Absolute: 0.6 10*3/uL (ref 0.1–1.0)
Monocytes Relative: 10 %
Neutro Abs: 3.4 10*3/uL (ref 1.7–7.7)
Neutrophils Relative %: 56 %
Platelets: 272 10*3/uL (ref 150–400)
RBC: 5.24 MIL/uL (ref 4.22–5.81)
RDW: 18.4 % — ABNORMAL HIGH (ref 11.5–15.5)
WBC: 6.1 10*3/uL (ref 4.0–10.5)
nRBC: 0 % (ref 0.0–0.2)

## 2018-10-01 LAB — COMPREHENSIVE METABOLIC PANEL
ALT: 28 U/L (ref 0–44)
AST: 34 U/L (ref 15–41)
Albumin: 4 g/dL (ref 3.5–5.0)
Alkaline Phosphatase: 58 U/L (ref 38–126)
Anion gap: 12 (ref 5–15)
BUN: 11 mg/dL (ref 8–23)
CO2: 27 mmol/L (ref 22–32)
Calcium: 9.4 mg/dL (ref 8.9–10.3)
Chloride: 96 mmol/L — ABNORMAL LOW (ref 98–111)
Creatinine, Ser: 0.86 mg/dL (ref 0.61–1.24)
GFR calc Af Amer: >60 mL/min (ref >60)
GFR calc non Af Amer: >60 mL/min (ref >60)
Glucose, Bld: 109 mg/dL — ABNORMAL HIGH (ref 70–99)
Potassium: 3.7 mmol/L (ref 3.5–5.1)
Sodium: 135 mmol/L (ref 135–145)
Total Bilirubin: 0.7 mg/dL (ref 0.3–1.2)
Total Protein: 8.3 g/dL — ABNORMAL HIGH (ref 6.5–8.1)

## 2018-10-01 LAB — LACTIC ACID, PLASMA: Lactic Acid, Venous: 1.8 mmol/L (ref 0.5–1.9)

## 2018-10-01 NOTE — Discharge Instructions (Signed)
We will call you back if your blood culture is again positive.  Otherwise follow-up with your doctor in 2 days.  Return to the emergency room for fever, vomiting, diarrhea, pain or burning with urination, abdominal pain, chest pain or shortness of breath.

## 2018-10-02 LAB — URINE CULTURE: Culture: <10000 — AB

## 2018-10-06 LAB — CULTURE, BLOOD (ROUTINE X 2)
Culture: NO GROWTH
Culture: NO GROWTH

## 2019-05-02 DIAGNOSIS — J449 Chronic obstructive pulmonary disease, unspecified: Secondary | ICD-10-CM | POA: Diagnosis not present

## 2019-05-27 DIAGNOSIS — I517 Cardiomegaly: Secondary | ICD-10-CM | POA: Diagnosis not present

## 2019-05-27 DIAGNOSIS — E118 Type 2 diabetes mellitus with unspecified complications: Secondary | ICD-10-CM | POA: Diagnosis not present

## 2019-05-27 DIAGNOSIS — Z1211 Encounter for screening for malignant neoplasm of colon: Secondary | ICD-10-CM | POA: Diagnosis not present

## 2019-05-27 DIAGNOSIS — R011 Cardiac murmur, unspecified: Secondary | ICD-10-CM | POA: Diagnosis not present

## 2019-05-27 DIAGNOSIS — I2510 Atherosclerotic heart disease of native coronary artery without angina pectoris: Secondary | ICD-10-CM | POA: Diagnosis not present

## 2019-05-27 DIAGNOSIS — E7800 Pure hypercholesterolemia, unspecified: Secondary | ICD-10-CM | POA: Diagnosis not present

## 2019-05-27 DIAGNOSIS — M109 Gout, unspecified: Secondary | ICD-10-CM | POA: Diagnosis not present

## 2019-05-27 DIAGNOSIS — I519 Heart disease, unspecified: Secondary | ICD-10-CM | POA: Diagnosis not present

## 2019-05-27 DIAGNOSIS — J449 Chronic obstructive pulmonary disease, unspecified: Secondary | ICD-10-CM | POA: Diagnosis not present

## 2019-05-27 DIAGNOSIS — I10 Essential (primary) hypertension: Secondary | ICD-10-CM | POA: Diagnosis not present

## 2019-05-27 DIAGNOSIS — E559 Vitamin D deficiency, unspecified: Secondary | ICD-10-CM | POA: Diagnosis not present

## 2019-05-27 DIAGNOSIS — E119 Type 2 diabetes mellitus without complications: Secondary | ICD-10-CM | POA: Diagnosis not present

## 2019-05-30 DIAGNOSIS — J449 Chronic obstructive pulmonary disease, unspecified: Secondary | ICD-10-CM | POA: Diagnosis not present

## 2019-06-30 DIAGNOSIS — J449 Chronic obstructive pulmonary disease, unspecified: Secondary | ICD-10-CM | POA: Diagnosis not present

## 2019-07-30 DIAGNOSIS — J449 Chronic obstructive pulmonary disease, unspecified: Secondary | ICD-10-CM | POA: Diagnosis not present

## 2019-08-30 DIAGNOSIS — J449 Chronic obstructive pulmonary disease, unspecified: Secondary | ICD-10-CM | POA: Diagnosis not present

## 2019-09-29 DIAGNOSIS — J449 Chronic obstructive pulmonary disease, unspecified: Secondary | ICD-10-CM | POA: Diagnosis not present

## 2019-10-30 DIAGNOSIS — J449 Chronic obstructive pulmonary disease, unspecified: Secondary | ICD-10-CM | POA: Diagnosis not present

## 2019-11-04 DIAGNOSIS — Z03818 Encounter for observation for suspected exposure to other biological agents ruled out: Secondary | ICD-10-CM | POA: Diagnosis not present

## 2019-11-30 DIAGNOSIS — J449 Chronic obstructive pulmonary disease, unspecified: Secondary | ICD-10-CM | POA: Diagnosis not present

## 2019-12-30 DIAGNOSIS — J449 Chronic obstructive pulmonary disease, unspecified: Secondary | ICD-10-CM | POA: Diagnosis not present

## 2020-01-30 DIAGNOSIS — J449 Chronic obstructive pulmonary disease, unspecified: Secondary | ICD-10-CM | POA: Diagnosis not present

## 2020-02-29 DIAGNOSIS — J449 Chronic obstructive pulmonary disease, unspecified: Secondary | ICD-10-CM | POA: Diagnosis not present

## 2020-03-01 DIAGNOSIS — M109 Gout, unspecified: Secondary | ICD-10-CM | POA: Diagnosis not present

## 2020-03-01 DIAGNOSIS — E119 Type 2 diabetes mellitus without complications: Secondary | ICD-10-CM | POA: Diagnosis not present

## 2020-03-01 DIAGNOSIS — J449 Chronic obstructive pulmonary disease, unspecified: Secondary | ICD-10-CM | POA: Diagnosis not present

## 2020-03-01 DIAGNOSIS — Z23 Encounter for immunization: Secondary | ICD-10-CM | POA: Diagnosis not present

## 2020-03-01 DIAGNOSIS — E7800 Pure hypercholesterolemia, unspecified: Secondary | ICD-10-CM | POA: Diagnosis not present

## 2020-03-01 DIAGNOSIS — K760 Fatty (change of) liver, not elsewhere classified: Secondary | ICD-10-CM | POA: Diagnosis not present

## 2020-03-01 DIAGNOSIS — E559 Vitamin D deficiency, unspecified: Secondary | ICD-10-CM | POA: Diagnosis not present

## 2020-03-01 DIAGNOSIS — I10 Essential (primary) hypertension: Secondary | ICD-10-CM | POA: Diagnosis not present

## 2020-03-02 DIAGNOSIS — E7800 Pure hypercholesterolemia, unspecified: Secondary | ICD-10-CM | POA: Diagnosis not present

## 2020-03-02 DIAGNOSIS — R5383 Other fatigue: Secondary | ICD-10-CM | POA: Diagnosis not present

## 2020-03-02 DIAGNOSIS — I10 Essential (primary) hypertension: Secondary | ICD-10-CM | POA: Diagnosis not present

## 2020-03-02 DIAGNOSIS — R7989 Other specified abnormal findings of blood chemistry: Secondary | ICD-10-CM | POA: Diagnosis not present

## 2020-03-02 DIAGNOSIS — I517 Cardiomegaly: Secondary | ICD-10-CM | POA: Diagnosis not present

## 2020-03-02 DIAGNOSIS — I519 Heart disease, unspecified: Secondary | ICD-10-CM | POA: Diagnosis not present

## 2020-03-02 DIAGNOSIS — R5381 Other malaise: Secondary | ICD-10-CM | POA: Diagnosis not present

## 2020-03-31 DIAGNOSIS — J449 Chronic obstructive pulmonary disease, unspecified: Secondary | ICD-10-CM | POA: Diagnosis not present

## 2020-05-01 DIAGNOSIS — J449 Chronic obstructive pulmonary disease, unspecified: Secondary | ICD-10-CM | POA: Diagnosis not present

## 2020-05-29 DIAGNOSIS — J449 Chronic obstructive pulmonary disease, unspecified: Secondary | ICD-10-CM | POA: Diagnosis not present

## 2020-06-05 ENCOUNTER — Emergency Department: Payer: Medicare Other

## 2020-06-05 ENCOUNTER — Inpatient Hospital Stay
Admission: EM | Admit: 2020-06-05 | Discharge: 2020-06-11 | DRG: 492 | Disposition: A | Discharge status: Skilled Nursing Facility | Payer: Medicare Other | Attending: Internal Medicine | Admitting: Internal Medicine

## 2020-06-05 ENCOUNTER — Other Ambulatory Visit: Payer: Self-pay

## 2020-06-05 ENCOUNTER — Encounter: Payer: Self-pay | Admitting: Emergency Medicine

## 2020-06-05 DIAGNOSIS — R059 Cough, unspecified: Secondary | ICD-10-CM | POA: Diagnosis not present

## 2020-06-05 DIAGNOSIS — Z79899 Other long term (current) drug therapy: Secondary | ICD-10-CM

## 2020-06-05 DIAGNOSIS — J690 Pneumonitis due to inhalation of food and vomit: Secondary | ICD-10-CM | POA: Diagnosis present

## 2020-06-05 DIAGNOSIS — R000 Tachycardia, unspecified: Secondary | ICD-10-CM | POA: Diagnosis not present

## 2020-06-05 DIAGNOSIS — S82201A Unspecified fracture of shaft of right tibia, initial encounter for closed fracture: Secondary | ICD-10-CM | POA: Diagnosis not present

## 2020-06-05 DIAGNOSIS — R0902 Hypoxemia: Secondary | ICD-10-CM

## 2020-06-05 DIAGNOSIS — R2681 Unsteadiness on feet: Secondary | ICD-10-CM | POA: Diagnosis not present

## 2020-06-05 DIAGNOSIS — F1010 Alcohol abuse, uncomplicated: Secondary | ICD-10-CM | POA: Diagnosis present

## 2020-06-05 DIAGNOSIS — J309 Allergic rhinitis, unspecified: Secondary | ICD-10-CM | POA: Diagnosis not present

## 2020-06-05 DIAGNOSIS — M2550 Pain in unspecified joint: Secondary | ICD-10-CM | POA: Diagnosis not present

## 2020-06-05 DIAGNOSIS — I10 Essential (primary) hypertension: Secondary | ICD-10-CM | POA: Diagnosis not present

## 2020-06-05 DIAGNOSIS — E871 Hypo-osmolality and hyponatremia: Secondary | ICD-10-CM | POA: Diagnosis not present

## 2020-06-05 DIAGNOSIS — M25562 Pain in left knee: Secondary | ICD-10-CM | POA: Diagnosis not present

## 2020-06-05 DIAGNOSIS — Z955 Presence of coronary angioplasty implant and graft: Secondary | ICD-10-CM

## 2020-06-05 DIAGNOSIS — R338 Other retention of urine: Secondary | ICD-10-CM

## 2020-06-05 DIAGNOSIS — Z7401 Bed confinement status: Secondary | ICD-10-CM | POA: Diagnosis not present

## 2020-06-05 DIAGNOSIS — M109 Gout, unspecified: Secondary | ICD-10-CM

## 2020-06-05 DIAGNOSIS — I509 Heart failure, unspecified: Secondary | ICD-10-CM | POA: Diagnosis not present

## 2020-06-05 DIAGNOSIS — R5381 Other malaise: Secondary | ICD-10-CM | POA: Diagnosis not present

## 2020-06-05 DIAGNOSIS — Z8781 Personal history of (healed) traumatic fracture: Secondary | ICD-10-CM

## 2020-06-05 DIAGNOSIS — I110 Hypertensive heart disease with heart failure: Secondary | ICD-10-CM | POA: Diagnosis present

## 2020-06-05 DIAGNOSIS — Z9981 Dependence on supplemental oxygen: Secondary | ICD-10-CM | POA: Diagnosis not present

## 2020-06-05 DIAGNOSIS — Z743 Need for continuous supervision: Secondary | ICD-10-CM | POA: Diagnosis not present

## 2020-06-05 DIAGNOSIS — Y92002 Bathroom of unspecified non-institutional (private) residence single-family (private) house as the place of occurrence of the external cause: Secondary | ICD-10-CM | POA: Diagnosis not present

## 2020-06-05 DIAGNOSIS — W19XXXA Unspecified fall, initial encounter: Secondary | ICD-10-CM | POA: Diagnosis not present

## 2020-06-05 DIAGNOSIS — G8929 Other chronic pain: Secondary | ICD-10-CM | POA: Diagnosis not present

## 2020-06-05 DIAGNOSIS — J9601 Acute respiratory failure with hypoxia: Secondary | ICD-10-CM

## 2020-06-05 DIAGNOSIS — J449 Chronic obstructive pulmonary disease, unspecified: Secondary | ICD-10-CM

## 2020-06-05 DIAGNOSIS — K219 Gastro-esophageal reflux disease without esophagitis: Secondary | ICD-10-CM | POA: Diagnosis not present

## 2020-06-05 DIAGNOSIS — F17210 Nicotine dependence, cigarettes, uncomplicated: Secondary | ICD-10-CM | POA: Diagnosis not present

## 2020-06-05 DIAGNOSIS — Z981 Arthrodesis status: Secondary | ICD-10-CM | POA: Diagnosis not present

## 2020-06-05 DIAGNOSIS — Z419 Encounter for procedure for purposes other than remedying health state, unspecified: Secondary | ICD-10-CM

## 2020-06-05 DIAGNOSIS — R339 Retention of urine, unspecified: Secondary | ICD-10-CM | POA: Diagnosis not present

## 2020-06-05 DIAGNOSIS — J189 Pneumonia, unspecified organism: Secondary | ICD-10-CM | POA: Diagnosis not present

## 2020-06-05 DIAGNOSIS — Z7982 Long term (current) use of aspirin: Secondary | ICD-10-CM

## 2020-06-05 DIAGNOSIS — S82201D Unspecified fracture of shaft of right tibia, subsequent encounter for closed fracture with routine healing: Secondary | ICD-10-CM | POA: Diagnosis not present

## 2020-06-05 DIAGNOSIS — W010XXA Fall on same level from slipping, tripping and stumbling without subsequent striking against object, initial encounter: Secondary | ICD-10-CM | POA: Diagnosis present

## 2020-06-05 DIAGNOSIS — S8992XA Unspecified injury of left lower leg, initial encounter: Secondary | ICD-10-CM | POA: Diagnosis not present

## 2020-06-05 DIAGNOSIS — S0990XA Unspecified injury of head, initial encounter: Secondary | ICD-10-CM | POA: Diagnosis not present

## 2020-06-05 DIAGNOSIS — M25561 Pain in right knee: Secondary | ICD-10-CM | POA: Diagnosis present

## 2020-06-05 DIAGNOSIS — M6281 Muscle weakness (generalized): Secondary | ICD-10-CM | POA: Diagnosis not present

## 2020-06-05 DIAGNOSIS — Z20822 Contact with and (suspected) exposure to covid-19: Secondary | ICD-10-CM | POA: Diagnosis not present

## 2020-06-05 DIAGNOSIS — R0689 Other abnormalities of breathing: Secondary | ICD-10-CM | POA: Diagnosis not present

## 2020-06-05 DIAGNOSIS — I2510 Atherosclerotic heart disease of native coronary artery without angina pectoris: Secondary | ICD-10-CM | POA: Diagnosis not present

## 2020-06-05 DIAGNOSIS — I4510 Unspecified right bundle-branch block: Secondary | ICD-10-CM | POA: Diagnosis present

## 2020-06-05 DIAGNOSIS — M79652 Pain in left thigh: Secondary | ICD-10-CM | POA: Diagnosis not present

## 2020-06-05 DIAGNOSIS — S82241K Displaced spiral fracture of shaft of right tibia, subsequent encounter for closed fracture with nonunion: Secondary | ICD-10-CM | POA: Diagnosis not present

## 2020-06-05 DIAGNOSIS — S82251A Displaced comminuted fracture of shaft of right tibia, initial encounter for closed fracture: Secondary | ICD-10-CM

## 2020-06-05 DIAGNOSIS — J441 Chronic obstructive pulmonary disease with (acute) exacerbation: Secondary | ICD-10-CM | POA: Diagnosis present

## 2020-06-05 DIAGNOSIS — S82241A Displaced spiral fracture of shaft of right tibia, initial encounter for closed fracture: Secondary | ICD-10-CM | POA: Diagnosis not present

## 2020-06-05 DIAGNOSIS — Z043 Encounter for examination and observation following other accident: Secondary | ICD-10-CM | POA: Diagnosis not present

## 2020-06-05 DIAGNOSIS — R52 Pain, unspecified: Secondary | ICD-10-CM

## 2020-06-05 DIAGNOSIS — W19XXXD Unspecified fall, subsequent encounter: Secondary | ICD-10-CM | POA: Diagnosis not present

## 2020-06-05 DIAGNOSIS — Z4789 Encounter for other orthopedic aftercare: Secondary | ICD-10-CM | POA: Diagnosis not present

## 2020-06-05 DIAGNOSIS — E785 Hyperlipidemia, unspecified: Secondary | ICD-10-CM | POA: Diagnosis not present

## 2020-06-05 HISTORY — DX: Gout, unspecified: M10.9

## 2020-06-05 LAB — CBC WITH DIFFERENTIAL/PLATELET
Abs Immature Granulocytes: 0.13 10*3/uL — ABNORMAL HIGH (ref 0.00–0.07)
Basophils Absolute: 0 10*3/uL (ref 0.0–0.1)
Basophils Relative: 0 %
Eosinophils Absolute: 0 10*3/uL (ref 0.0–0.5)
Eosinophils Relative: 0 %
HCT: 40 % (ref 39.0–52.0)
Hemoglobin: 13.4 g/dL (ref 13.0–17.0)
Immature Granulocytes: 1 %
Lymphocytes Relative: 11 %
Lymphs Abs: 1.1 10*3/uL (ref 0.7–4.0)
MCH: 29.3 pg (ref 26.0–34.0)
MCHC: 33.5 g/dL (ref 30.0–36.0)
MCV: 87.5 fL (ref 80.0–100.0)
Monocytes Absolute: 0.8 10*3/uL (ref 0.1–1.0)
Monocytes Relative: 7 %
Neutro Abs: 8.5 10*3/uL — ABNORMAL HIGH (ref 1.7–7.7)
Neutrophils Relative %: 81 %
Platelets: 417 10*3/uL — ABNORMAL HIGH (ref 150–400)
RBC: 4.57 MIL/uL (ref 4.22–5.81)
RDW: 16.9 % — ABNORMAL HIGH (ref 11.5–15.5)
WBC: 10.5 10*3/uL (ref 4.0–10.5)
nRBC: 0 % (ref 0.0–0.2)

## 2020-06-05 LAB — CBC
HCT: 38.8 % — ABNORMAL LOW (ref 39.0–52.0)
Hemoglobin: 12.7 g/dL — ABNORMAL LOW (ref 13.0–17.0)
MCH: 29 pg (ref 26.0–34.0)
MCHC: 32.7 g/dL (ref 30.0–36.0)
MCV: 88.6 fL (ref 80.0–100.0)
Platelets: 389 10*3/uL (ref 150–400)
RBC: 4.38 MIL/uL (ref 4.22–5.81)
RDW: 16.9 % — ABNORMAL HIGH (ref 11.5–15.5)
WBC: 11.5 10*3/uL — ABNORMAL HIGH (ref 4.0–10.5)
nRBC: 0 % (ref 0.0–0.2)

## 2020-06-05 LAB — COMPREHENSIVE METABOLIC PANEL
ALT: 15 U/L (ref 0–44)
AST: 26 U/L (ref 15–41)
Albumin: 3 g/dL — ABNORMAL LOW (ref 3.5–5.0)
Alkaline Phosphatase: 78 U/L (ref 38–126)
Anion gap: 12 (ref 5–15)
BUN: 12 mg/dL (ref 8–23)
CO2: 25 mmol/L (ref 22–32)
Calcium: 8.4 mg/dL — ABNORMAL LOW (ref 8.9–10.3)
Chloride: 94 mmol/L — ABNORMAL LOW (ref 98–111)
Creatinine, Ser: 0.87 mg/dL (ref 0.61–1.24)
GFR, Estimated: >60 mL/min (ref >60)
Glucose, Bld: 120 mg/dL — ABNORMAL HIGH (ref 70–99)
Potassium: 4.7 mmol/L (ref 3.5–5.1)
Sodium: 131 mmol/L — ABNORMAL LOW (ref 135–145)
Total Bilirubin: 1.2 mg/dL (ref 0.3–1.2)
Total Protein: 7.1 g/dL (ref 6.5–8.1)

## 2020-06-05 LAB — BLOOD GAS, VENOUS
Acid-Base Excess: 6.4 mmol/L — ABNORMAL HIGH (ref 0.0–2.0)
Bicarbonate: 31.9 mmol/L — ABNORMAL HIGH (ref 20.0–28.0)
O2 Saturation: 93.5 %
Patient temperature: 37
pCO2, Ven: 48 mmHg (ref 44.0–60.0)
pH, Ven: 7.43 (ref 7.250–7.430)
pO2, Ven: 67 mmHg — ABNORMAL HIGH (ref 32.0–45.0)

## 2020-06-05 LAB — CREATININE, SERUM
Creatinine, Ser: 0.89 mg/dL (ref 0.61–1.24)
GFR, Estimated: >60 mL/min (ref >60)

## 2020-06-05 LAB — ETHANOL: Alcohol, Ethyl (B): <10 mg/dL (ref <10)

## 2020-06-05 LAB — LACTIC ACID, PLASMA
Lactic Acid, Venous: 1.3 mmol/L (ref 0.5–1.9)
Lactic Acid, Venous: 1.7 mmol/L (ref 0.5–1.9)

## 2020-06-05 LAB — RESP PANEL BY RT-PCR (FLU A&B, COVID) ARPGX2
Influenza A by PCR: NEGATIVE
Influenza B by PCR: NEGATIVE
SARS Coronavirus 2 by RT PCR: NEGATIVE

## 2020-06-05 MED ORDER — SODIUM CHLORIDE 0.9 % IV BOLUS
500.0000 mL | Freq: Once | INTRAVENOUS | Status: AC
  Administered 2020-06-05: 500 mL via INTRAVENOUS

## 2020-06-05 MED ORDER — PRAVASTATIN SODIUM 20 MG PO TABS
80.0000 mg | ORAL_TABLET | Freq: Every day | ORAL | Status: DC
  Administered 2020-06-06 – 2020-06-10 (×4): 80 mg via ORAL
  Filled 2020-06-05 (×4): qty 4

## 2020-06-05 MED ORDER — METHYLPREDNISOLONE SODIUM SUCC 125 MG IJ SOLR
125.0000 mg | Freq: Once | INTRAMUSCULAR | Status: AC
  Administered 2020-06-05: 125 mg via INTRAVENOUS
  Filled 2020-06-05: qty 2

## 2020-06-05 MED ORDER — PAROXETINE HCL 10 MG PO TABS
10.0000 mg | ORAL_TABLET | Freq: Every day | ORAL | Status: DC
  Administered 2020-06-05 – 2020-06-11 (×7): 10 mg via ORAL
  Filled 2020-06-05 (×7): qty 1

## 2020-06-05 MED ORDER — ONDANSETRON HCL 4 MG/2ML IJ SOLN
4.0000 mg | Freq: Once | INTRAMUSCULAR | Status: AC
  Administered 2020-06-05: 4 mg via INTRAVENOUS
  Filled 2020-06-05: qty 2

## 2020-06-05 MED ORDER — SODIUM CHLORIDE 0.9 % IV SOLN
2.0000 g | INTRAVENOUS | Status: DC
  Administered 2020-06-06: 2 g via INTRAVENOUS
  Filled 2020-06-05: qty 2
  Filled 2020-06-05: qty 20

## 2020-06-05 MED ORDER — MORPHINE SULFATE (PF) 4 MG/ML IV SOLN
4.0000 mg | Freq: Once | INTRAVENOUS | Status: AC
  Administered 2020-06-05: 4 mg via INTRAVENOUS
  Filled 2020-06-05: qty 1

## 2020-06-05 MED ORDER — HYDROCODONE-ACETAMINOPHEN 5-325 MG PO TABS
1.0000 | ORAL_TABLET | ORAL | Status: DC | PRN
  Administered 2020-06-05 – 2020-06-11 (×20): 1 via ORAL
  Filled 2020-06-05 (×21): qty 1

## 2020-06-05 MED ORDER — FUROSEMIDE 40 MG PO TABS
40.0000 mg | ORAL_TABLET | Freq: Every day | ORAL | Status: DC
  Administered 2020-06-06 – 2020-06-08 (×3): 40 mg via ORAL
  Filled 2020-06-05 (×3): qty 1

## 2020-06-05 MED ORDER — SODIUM CHLORIDE 0.9 % IV SOLN
500.0000 mg | Freq: Once | INTRAVENOUS | Status: AC
  Administered 2020-06-05: 500 mg via INTRAVENOUS
  Filled 2020-06-05: qty 500

## 2020-06-05 MED ORDER — PANTOPRAZOLE SODIUM 40 MG PO TBEC
40.0000 mg | DELAYED_RELEASE_TABLET | Freq: Every day | ORAL | Status: DC
  Administered 2020-06-06 – 2020-06-11 (×6): 40 mg via ORAL
  Filled 2020-06-05 (×6): qty 1

## 2020-06-05 MED ORDER — IPRATROPIUM-ALBUTEROL 0.5-2.5 (3) MG/3ML IN SOLN: 3.0000 mL | RESPIRATORY_TRACT | Status: DC | PRN

## 2020-06-05 MED ORDER — ENOXAPARIN SODIUM 60 MG/0.6ML ~~LOC~~ SOLN
50.0000 mg | SUBCUTANEOUS | Status: DC
  Administered 2020-06-05 – 2020-06-10 (×5): 50 mg via SUBCUTANEOUS
  Filled 2020-06-05 (×5): qty 0.6

## 2020-06-05 MED ORDER — METOPROLOL TARTRATE 25 MG PO TABS
25.0000 mg | ORAL_TABLET | Freq: Two times a day (BID) | ORAL | Status: DC
  Administered 2020-06-05 – 2020-06-11 (×11): 25 mg via ORAL
  Filled 2020-06-05 (×11): qty 1

## 2020-06-05 MED ORDER — IPRATROPIUM-ALBUTEROL 0.5-2.5 (3) MG/3ML IN SOLN
3.0000 mL | Freq: Once | RESPIRATORY_TRACT | Status: AC
  Administered 2020-06-05: 3 mL via RESPIRATORY_TRACT
  Filled 2020-06-05: qty 3

## 2020-06-05 MED ORDER — AZITHROMYCIN 500 MG IV SOLR
500.0000 mg | INTRAVENOUS | Status: DC
  Administered 2020-06-06: 500 mg via INTRAVENOUS
  Filled 2020-06-05 (×2): qty 500

## 2020-06-05 MED ORDER — POTASSIUM CHLORIDE CRYS ER 20 MEQ PO TBCR
20.0000 meq | EXTENDED_RELEASE_TABLET | Freq: Every day | ORAL | Status: DC
  Administered 2020-06-06 – 2020-06-11 (×6): 20 meq via ORAL
  Filled 2020-06-05 (×6): qty 1

## 2020-06-05 MED ORDER — SODIUM CHLORIDE 0.9 % IV SOLN
2.0000 g | Freq: Once | INTRAVENOUS | Status: AC
  Administered 2020-06-05: 2 g via INTRAVENOUS
  Filled 2020-06-05: qty 20

## 2020-06-05 MED ORDER — TAMSULOSIN HCL 0.4 MG PO CAPS
0.4000 mg | ORAL_CAPSULE | Freq: Every day | ORAL | Status: DC
  Administered 2020-06-05 – 2020-06-10 (×5): 0.4 mg via ORAL
  Filled 2020-06-05 (×6): qty 1

## 2020-06-05 NOTE — ED Notes (Signed)
Called lab to obtain the rest of the pt's bloodwork at this time. Pt is a difficult stick

## 2020-06-05 NOTE — ED Notes (Signed)
Pt transported to X-Ray at this time.  

## 2020-06-05 NOTE — ED Notes (Signed)
Called lab to attempt and get bloodwork

## 2020-06-05 NOTE — Consult Note (Signed)
Reason for Consult: Right tibial shaft fracture Referring Physician: Dr. Vivianne Harvey is an 65 y.o. male.  HPI: Patient is a 65 year old minimal ambulator secondary to severe gout who suffered a fall when going to the bathroom today he was brought to the emergency room where he was found to have a spiral mid to proximal shaft fracture of the right tibia minimally displaced with proximal fibula fracture.  Additionally he is found to be quite hypoxic and has pneumonia is being admitted for that.  Past Medical History:  Diagnosis Date  . COPD (chronic obstructive pulmonary disease) ([facility])   . Gout   . Hypertension     Past Surgical History:  Procedure Laterality Date  . BACK SURGERY    . CARDIAC SURGERY    . CERVICAL DISC SURGERY      No family history on file.  Social History:  reports that he has been smoking cigarettes. He has never used smokeless tobacco. He reports current drug use. Drug: Marijuana. He reports that he does not drink alcohol.  Allergies: No Known Allergies  Medications: I have reviewed the patient's current medications.  Results for orders placed or performed during the hospital encounter of 06/05/20 (from the past 48 hour(s))  CBC with Differential     Status: Abnormal   Collection Time: 06/05/20  6:05 PM  Result Value Ref Range   WBC 10.5 4.0 - 10.5 K/uL   RBC 4.57 4.22 - 5.81 MIL/uL   Hemoglobin 13.4 13.0 - 17.0 g/dL   HCT 06.3 01.6 - 01.0 %   MCV 87.5 80.0 - 100.0 fL   MCH 29.3 26.0 - 34.0 pg   MCHC 33.5 30.0 - 36.0 g/dL   RDW 93.2 (H) 35.5 - 73.2 %   Platelets 417 (H) 150 - 400 K/uL   nRBC 0.0 0.0 - 0.2 %   Neutrophils Relative % 81 %   Neutro Abs 8.5 (H) 1.7 - 7.7 K/uL   Lymphocytes Relative 11 %   Lymphs Abs 1.1 0.7 - 4.0 K/uL   Monocytes Relative 7 %   Monocytes Absolute 0.8 0.1 - 1.0 K/uL   Eosinophils Relative 0 %   Eosinophils Absolute 0.0 0.0 - 0.5 K/uL   Basophils Relative 0 %   Basophils Absolute 0.0 0.0 - 0.1 K/uL   Immature  Granulocytes 1 %   Abs Immature Granulocytes 0.13 (H) 0.00 - 0.07 K/uL    Comment: Performed at [facility], [address] [place], [place] [address]  Comprehensive metabolic panel     Status: Abnormal   Collection Time: 06/05/20  6:05 PM  Result Value Ref Range   Sodium 131 (L) 135 - 145 mmol/L   Potassium 4.7 3.5 - 5.1 mmol/L    Comment: HEMOLYSIS AT THIS LEVEL MAY AFFECT RESULT   Chloride 94 (L) 98 - 111 mmol/L   CO2 25 22 - 32 mmol/L   Glucose, Bld 120 (H) 70 - 99 mg/dL    Comment: Glucose reference range applies only to samples taken after fasting for at least 8 hours.   BUN 12 8 - 23 mg/dL   Creatinine, Ser 2.70 0.61 - 1.24 mg/dL   Calcium 8.4 (L) 8.9 - 10.3 mg/dL   Total Protein 7.1 6.5 - 8.1 g/dL   Albumin 3.0 (L) 3.5 - 5.0 g/dL   AST 26 15 - 41 U/L    Comment: HEMOLYSIS AT THIS LEVEL MAY AFFECT RESULT   ALT 15 0 - 44 U/L  Alkaline Phosphatase 78 38 - 126 U/L   Total Bilirubin 1.2 0.3 - 1.2 mg/dL    Comment: HEMOLYSIS AT THIS LEVEL MAY AFFECT RESULT   GFR, Estimated >60 >60 mL/min    Comment: (NOTE) Calculated using the CKD-EPI Creatinine Equation (2021)    Anion gap 12 5 - 15    Comment: Performed at [facility], [address], [place], [place] [address]  Resp Panel by RT-PCR (Flu A&B, Covid) Nasopharyngeal Swab     Status: None   Collection Time: 06/05/20  6:37 PM   Specimen: Nasopharyngeal Swab; Nasopharyngeal(NP) swabs in vial transport medium  Result Value Ref Range   SARS Coronavirus 2 by RT PCR NEGATIVE NEGATIVE    Comment: (NOTE) SARS-CoV-2 target nucleic acids are NOT DETECTED.  The SARS-CoV-2 RNA is generally detectable in upper respiratory specimens during the acute phase of infection. The lowest concentration of SARS-CoV-2 viral copies this assay can detect is 138 copies/mL. A negative result does not preclude SARS-Cov-2 infection and should not be used as the sole basis for treatment or other patient management decisions. A  negative result may occur with  improper specimen collection/handling, submission of specimen other than nasopharyngeal swab, presence of viral mutation(s) within the areas targeted by this assay, and inadequate number of viral copies(<138 copies/mL). A negative result must be combined with clinical observations, patient history, and epidemiological information. The expected result is Negative.  Fact Sheet for Patients:  BloggerCourse.com  Fact Sheet for Healthcare Providers:  SeriousBroker.it  This test is no t yet approved or cleared by the [place] FDA and  has been authorized for detection and/or diagnosis of SARS-CoV-2 by FDA under an Emergency Use Authorization (EUA). This EUA will remain  in effect (meaning this test can be used) for the duration of the COVID-19 declaration under Section 564(b)(1) of the Act, 21 U.S.C.section 360bbb-3(b)(1), unless the authorization is terminated  or revoked sooner.       Influenza A by PCR NEGATIVE NEGATIVE   Influenza B by PCR NEGATIVE NEGATIVE    Comment: (NOTE) The Xpert Xpress SARS-CoV-2/FLU/RSV plus assay is intended as an aid in the diagnosis of influenza from Nasopharyngeal swab specimens and should not be used as a sole basis for treatment. Nasal washings and aspirates are unacceptable for Xpert Xpress SARS-CoV-2/FLU/RSV testing.  Fact Sheet for Patients: BloggerCourse.com  Fact Sheet for Healthcare Providers: SeriousBroker.it  This test is not yet approved or cleared by the [place] FDA and has been authorized for detection and/or diagnosis of SARS-CoV-2 by FDA under an Emergency Use Authorization (EUA). This EUA will remain in effect (meaning this test can be used) for the duration of the COVID-19 declaration under Section 564(b)(1) of the Act, 21 U.S.C. section 360bbb-3(b)(1), unless the authorization is terminated  or revoked.  Performed at [facility], [address] [place], [place] [address]   Blood gas, venous     Status: Abnormal   Collection Time: 06/05/20  7:08 PM  Result Value Ref Range   pH, Ven 7.43 7.250 - 7.430   pCO2, Ven 48 44.0 - 60.0 mmHg   pO2, Ven 67.0 (H) 32.0 - 45.0 mmHg   Bicarbonate 31.9 (H) 20.0 - 28.0 mmol/L   Acid-Base Excess 6.4 (H) 0.0 - 2.0 mmol/L   O2 Saturation 93.5 %   Patient temperature 37.0    Collection site LINE    Sample type VENOUS     Comment: Performed at [facility], [address] [place],  [place] [id number]    DG Chest 1 View  Result Date: 06/05/2020 CLINICAL DATA:  Leg pain after fall. EXAM: CHEST  1 VIEW COMPARISON:  Jul 09, 2013 FINDINGS: There is new infiltrate in the right lung base. Rounded opacity projected over the right upper lung in the perihilar region. The heart, hila, and mediastinum are otherwise unremarkable. Skin fold seen over the lateral left lung base. No pneumothorax. No other abnormalities. IMPRESSION: 1. New infiltrate in the right base worrisome for pneumonia or aspiration. Recommend attention on short-term follow-up imaging after treatment. 2. Rounded opacity over the right upper lung is favored to represent confluence of shadows. Recommend attention on follow-up. Electronically Signed   By: Gerome Samavid  Williams III M.D   On: 06/05/2020 18:12   DG Knee 1-2 Views Left  Result Date: 06/05/2020 CLINICAL DATA:  Pain after fall EXAM: LEFT KNEE - 1-2 VIEW COMPARISON:  None. FINDINGS: The study is limited due to positioning. No fractures, dislocation, or joint effusion identified. Vascular calcifications. IMPRESSION: Limited study due to positioning.  No abnormalities noted. Electronically Signed   By: Gerome Samavid  Williams III M.D   On: 06/05/2020 18:10   DG Tibia/Fibula Right  Result Date: 06/05/2020 CLINICAL DATA:  Pain after fall EXAM: RIGHT TIBIA AND FIBULA - 2 VIEW COMPARISON:  None. FINDINGS: There is a lucency associated  with the medial aspect of the proximal fibula, not definitely extending through the entire shaft. No other fibular abnormalities. There is a comminuted mildly displaced fracture through the mid tibial diaphysis. IMPRESSION: 1. Comminuted mildly displaced fracture through the mid tibial diaphysis. 2. There is a lucency associated with the medial aspect of the proximal fibula, not definitely extending through the entire shaft. A subtle fracture is not excluded. Electronically Signed   By: Gerome Samavid  Williams III M.D   On: 06/05/2020 18:09   CT Head Wo Contrast  Result Date: 06/05/2020 CLINICAL DATA:  Fall EXAM: CT HEAD WITHOUT CONTRAST TECHNIQUE: Contiguous axial images were obtained from the base of the skull through the vertex without intravenous contrast. COMPARISON:  09/19/2018 FINDINGS: Brain: No acute intracranial abnormality. Specifically, no hemorrhage, hydrocephalus, mass lesion, acute infarction, or significant intracranial injury. Vascular: No hyperdense vessel or unexpected calcification. Skull: No acute calvarial abnormality. Sinuses/Orbits: No acute findings Other: None IMPRESSION: No acute intracranial abnormality. Electronically Signed   By: Charlett NoseKevin  Dover M.D.   On: 06/05/2020 20:06   DG Femur Min 2 Views Left  Result Date: 06/05/2020 CLINICAL DATA:  Pain after fall. EXAM: LEFT FEMUR 2 VIEWS COMPARISON:  None. FINDINGS: There is no evidence of fracture or other focal bone lesions. Soft tissues are unremarkable. IMPRESSION: Negative. Electronically Signed   By: Gerome Samavid  Williams III M.D   On: 06/05/2020 18:07    Review of Systems Blood pressure 100/70, pulse (!) 103, temperature (!) 97.1 F (36.2 C), temperature source Oral, resp. rate 15, height 5\' 8"  (1.727 m), weight 98.9 kg, SpO2 97 %. Physical Exam His right leg is in the splint alignment is normal he has a great deal of edema to both lower extremities with poor circulation poor capillary refill and intact sensation. Assessment/Plan: Spiral  tibia fracture and patient with multiple medical comorbidities.  Recommendation is for IM rod when medically stable from a pulmonary standpoint to allow for mobilization.  With his skin condition and poor circulation I think prolonged immobilization in a cast is likely to lead to significant skin ulceration.  We will follow and operate when medically advisable with pulmonary status.  Aaron NeedleMichael  Aaron Harvey 06/05/2020, 8:08 PM

## 2020-06-05 NOTE — ED Notes (Signed)
Pt still in XRay 

## 2020-06-05 NOTE — ED Notes (Signed)
Called Lab to collect blood work.

## 2020-06-05 NOTE — ED Provider Notes (Addendum)
[facility] Emergency Department Provider Note  ____________________________________________   Event Date/Time   First MD Initiated Contact with Patient 06/05/20 1547     (approximate)  I have reviewed the triage vital signs and the nursing notes.   HISTORY  Chief Complaint Fall    HPI Aaron Harvey is a 65 y.o. male with history of hypertension, COPD, here with fall and leg pain.  The patient states that he was in his usual state of health.  He went to go to the bathroom when he tripped on close that were on the floor.  He fell, landing primarily onto his right leg and twisting it, causing severe, 10 of 10, aching, throbbing, pain at the shin.  He has had associated pain in his left knee where he came down and he has been unable to straighten his leg.  Has been unable to ambulate.  He does not believe he hit his head.  No headache, neck pain.  No chest pain.  He does state he feels mildly short of breath but believes is due to being on the ground and due to pain.  This occurred just prior to arrival.  He denies blood thinners.  No fevers or chills.  No other medical complaints.  No specific aggravating factors other than movement.  No relieving factors        Past Medical History:  Diagnosis Date  . COPD (chronic obstructive pulmonary disease) (HCC)   . Gout   . Hypertension     There are no problems to display for this patient.   Past Surgical History:  Procedure Laterality Date  . BACK SURGERY    . CARDIAC SURGERY    . CERVICAL DISC SURGERY      Prior to Admission medications   Medication Sig Start Date End Date Taking? Authorizing Provider  albuterol (PROAIR HFA) 108 (90 Base) MCG/ACT inhaler Take 2 puffs by mouth every 4 (four) hours as needed. Reported on 09/15/2015 10/24/10   [provider]  aspirin 81 MG chewable tablet Chew by mouth daily.    [provider]  COLCRYS 0.6 MG tablet Take 0.6 mg by mouth daily.  08/29/18    [provider]  gabapentin (NEURONTIN) 300 MG capsule Take 900 mg by mouth 3 (three) times daily. Reported on 09/15/2015 12/10/14   [provider]  metoprolol tartrate (LOPRESSOR) 25 MG tablet Take 25 mg by mouth 2 (two) times daily. Reported on 09/15/2015 10/24/10   [provider]  omeprazole (PRILOSEC) 40 MG capsule Take 40 mg by mouth daily. 07/30/18   [provider]  PARoxetine (PAXIL) 10 MG tablet Take 10 mg by mouth daily. 08/29/18   [provider]  pravastatin (PRAVACHOL) 20 MG tablet Take 80 mg by mouth daily. Reported on 09/15/2015 10/24/10   [provider]  TRELEGY ELLIPTA 100-62.5-25 MCG/INH AEPB Inhale 2 puffs into the lungs daily.  08/29/18   [provider]    Allergies Patient has no known allergies.  No family history on file.  Social History Social History   Tobacco Use  . Smoking status: Current Every Day Smoker    Types: Cigarettes  . Smokeless tobacco: Never Used  Vaping Use  . Vaping Use: Never used  Substance Use Topics  . Alcohol use: No  . Drug use: Yes    Types: Marijuana    Review of Systems  Review of Systems  Constitutional: Positive for fatigue. Negative for chills and fever.  HENT: Negative for sore throat.   Respiratory: Positive for cough. Negative for shortness of breath.   Cardiovascular: Negative for chest pain.  Gastrointestinal: Negative for abdominal pain.  Genitourinary: Negative for flank pain.  Musculoskeletal: Positive for arthralgias, gait problem and myalgias. Negative for neck pain.  Skin: Negative for rash and wound.  Allergic/Immunologic: Negative for immunocompromised state.  Neurological: Negative for weakness and numbness.  Hematological: Does not bruise/bleed easily.  All other systems reviewed and are negative.    ____________________________________________  PHYSICAL EXAM:      VITAL SIGNS: ED Triage Vitals  Enc Vitals Group     BP      Pulse      Resp       Temp      Temp src      SpO2      Weight      Height      Head Circumference      Peak Flow      Pain Score      Pain Loc      Pain Edu?      Excl. in GC?      Physical Exam Vitals and nursing note reviewed.  Constitutional:      General: He is not in acute distress.    Appearance: He is well-developed.  HENT:     Head: Normocephalic and atraumatic.  Eyes:     Conjunctiva/sclera: Conjunctivae normal.  Cardiovascular:     Rate and Rhythm: Normal rate and regular rhythm.     Heart sounds: Normal heart sounds.  Pulmonary:     Effort: Pulmonary effort is normal. No respiratory distress.     Breath sounds: Wheezing present.  Abdominal:     General: Abdomen is flat. There is no distension.  Musculoskeletal:     Cervical back: Neck supple.     Comments: Tenderness to the right anterior mid and lower shin, with edema, bruising. TTP over left anterior knee with marked TTP over proximal knee. Moderate knee effusion. Unable to straighten L leg at knee.  Skin:    General: Skin is warm.     Capillary Refill: Capillary refill takes less than 2 seconds.     Findings: No rash.  Neurological:     Mental Status: He is alert and oriented to person, place, and time.     Motor: No abnormal muscle tone.       ____________________________________________   LABS (all labs ordered are listed, but only abnormal results are displayed)  Labs Reviewed  CBC WITH DIFFERENTIAL/PLATELET - Abnormal; Notable for the following components:      Result Value   RDW 16.9 (*)    Platelets 417 (*)    Neutro Abs 8.5 (*)    Abs Immature Granulocytes 0.13 (*)    All other components within normal limits  COMPREHENSIVE METABOLIC PANEL - Abnormal; Notable for the following components:   Sodium 131 (*)    Chloride 94 (*)    Glucose, Bld 120 (*)    Calcium 8.4 (*)    Albumin 3.0 (*)    All other components within normal limits  CULTURE, BLOOD (ROUTINE X 2)  CULTURE, BLOOD (ROUTINE X 2)  RESP  PANEL BY RT-PCR (FLU A&B, COVID) ARPGX2  LACTIC ACID, PLASMA  LACTIC ACID, PLASMA  ETHANOL    ____________________________________________  EKG: Normal sinus rhythm, ventricular rate 95.  PR 164, QRS 118, QTc 469.  Incomplete right bundle.  No acute ST elevations. ________________________________________  RADIOLOGY  All imaging, including plain films, CT scans, and ultrasounds, independently reviewed by me, and interpretations confirmed via formal radiology reads.  ED MD interpretation:   Chest x-ray: No infiltrate in the right base worrisome for pneumonia X-ray knee: Negative but limited Femur left: Negative Tib-fib right: Comminuted tibial diaphysis fracture, suspected proximal fibula fracture  Official radiology report(s): DG Chest 1 View  Result Date: 06/05/2020 CLINICAL DATA:  Leg pain after fall. EXAM: CHEST  1 VIEW COMPARISON:  Jul 09, 2013 FINDINGS: There is new infiltrate in the right lung base. Rounded opacity projected over the right upper lung in the perihilar region. The heart, hila, and mediastinum are otherwise unremarkable. Skin fold seen over the lateral left lung base. No pneumothorax. No other abnormalities. IMPRESSION: 1. New infiltrate in the right base worrisome for pneumonia or aspiration. Recommend attention on short-term follow-up imaging after treatment. 2. Rounded opacity over the right upper lung is favored to represent confluence of shadows. Recommend attention on follow-up. Electronically Signed   By: Gerome Sam III M.D   On: 06/05/2020 18:12   DG Knee 1-2 Views Left  Result Date: 06/05/2020 CLINICAL DATA:  Pain after fall EXAM: LEFT KNEE - 1-2 VIEW COMPARISON:  None. FINDINGS: The study is limited due to positioning. No fractures, dislocation, or joint effusion identified. Vascular calcifications. IMPRESSION: Limited study due to positioning.  No abnormalities noted. Electronically Signed   By: Gerome Sam III M.D   On: 06/05/2020 18:10   DG  Tibia/Fibula Right  Result Date: 06/05/2020 CLINICAL DATA:  Pain after fall EXAM: RIGHT TIBIA AND FIBULA - 2 VIEW COMPARISON:  None. FINDINGS: There is a lucency associated with the medial aspect of the proximal fibula, not definitely extending through the entire shaft. No other fibular abnormalities. There is a comminuted mildly displaced fracture through the mid tibial diaphysis. IMPRESSION: 1. Comminuted mildly displaced fracture through the mid tibial diaphysis. 2. There is a lucency associated with the medial aspect of the proximal fibula, not definitely extending through the entire shaft. A subtle fracture is not excluded. Electronically Signed   By: Gerome Sam III M.D   On: 06/05/2020 18:09   DG Femur Min 2 Views Left  Result Date: 06/05/2020 CLINICAL DATA:  Pain after fall. EXAM: LEFT FEMUR 2 VIEWS COMPARISON:  None. FINDINGS: There is no evidence of fracture or other focal bone lesions. Soft tissues are unremarkable. IMPRESSION: Negative. Electronically Signed   By: Gerome Sam III M.D   On: 06/05/2020 18:07    ____________________________________________  PROCEDURES   Procedure(s) performed (including Critical Care):  .1-3 Lead EKG Interpretation Performed by: Shaune Pollack, MD Authorized by: Shaune Pollack, MD     Interpretation: non-specific     ECG rate:  90-100   ECG rate assessment: normal     Rhythm: sinus rhythm     Ectopy: none     Conduction: normal   Comments:     Indication: hypoxia    ____________________________________________  INITIAL IMPRESSION / MDM / ASSESSMENT AND PLAN / ED COURSE  As part of my medical decision making, I reviewed the following data within the electronic MEDICAL RECORD NUMBER Nursing notes reviewed and incorporated, Old chart reviewed, Notes from prior ED visits, and [place] Controlled Substance Database       *ARRIN PINTOR was evaluated in Emergency Department on 06/05/2020 for the symptoms described in the history of present  illness. He was evaluated in the context of the global COVID-19 pandemic, which necessitated consideration that the patient  might be at risk for infection with the SARS-CoV-2 virus that causes COVID-19. Institutional protocols and algorithms that pertain to the evaluation of patients at risk for COVID-19 are in a state of rapid change based on information released by regulatory bodies including the CDC and federal and state organizations. These policies and algorithms were followed during the patient's care in the ED.  Some ED evaluations and interventions may be delayed as a result of limited staffing during the pandemic.*     Medical Decision Making: 65 year old male here with fall and generalized weakness.  Regarding his fall, imaging shows right tibia fracture.  Placed in posterior splint and Dr. Rosita Kea with orthopedics has been consulted.  Will give analgesia, elevate.  Nonweightbearing.  Regarding his weakness, he is hypoxic here with diffuse wheezing.  He has a history of COPD.  Chest x-ray shows right basilar pneumonia, I suspect this could have contributed to his weakness and subsequent fall.  Will plan to start steroids, antibiotics, and admit.  ____________________________________________  FINAL CLINICAL IMPRESSION(S) / ED DIAGNOSES  Final diagnoses:  Community acquired pneumonia of right lower lobe of lung  COPD exacerbation (HCC)  Fall, initial encounter  Closed displaced comminuted fracture of shaft of right tibia, initial encounter  Hypoxia     MEDICATIONS GIVEN DURING THIS VISIT:  Medications  cefTRIAXone (ROCEPHIN) 2 g in sodium chloride 0.9 % 100 mL IVPB (has no administration in time range)  azithromycin (ZITHROMAX) 500 mg in sodium chloride 0.9 % 250 mL IVPB (has no administration in time range)  morphine 4 MG/ML injection 4 mg (4 mg Intravenous Given 06/05/20 1613)  ondansetron (ZOFRAN) injection 4 mg (4 mg Intravenous Given 06/05/20 1614)  ipratropium-albuterol (DUONEB)  0.5-2.5 (3) MG/3ML nebulizer solution 3 mL (3 mLs Nebulization Given 06/05/20 1614)  ipratropium-albuterol (DUONEB) 0.5-2.5 (3) MG/3ML nebulizer solution 3 mL (3 mLs Nebulization Given 06/05/20 1614)  methylPREDNISolone sodium succinate (SOLU-MEDROL) 125 mg/2 mL injection 125 mg (125 mg Intravenous Given 06/05/20 1838)     ED Discharge Orders    None       Note:  This document was prepared using Dragon voice recognition software and may include unintentional dictation errors.   Shaune Pollack, MD 06/05/20 Aaron Harvey    Shaune Pollack, MD 06/05/20 [phone]

## 2020-06-05 NOTE — ED Notes (Signed)
Family updated as to patient's status.

## 2020-06-05 NOTE — ED Notes (Signed)
Labs draws and sent off.

## 2020-06-05 NOTE — H&P (Signed)
History and Physical    Aaron Harvey:[id number] DOB: Aug 03, 1955 DOA: 06/05/2020  PCP: Armando Gang, FNP   Patient coming from: Home  I have personally briefly reviewed patient's old medical records in [facility]  Chief Complaint: Fall  HPI: Aaron Harvey is a 65 y.o. male with medical history significant for COPD, gout, HTN, presenting to the ED via EMS following what appears to be an accidental fall onto his right side, in which he tripped and fell as he was trying to go to the bathroom.  He did not hit his head and did not lose consciousness.  Patient was previously in his usual state of health.  Denied preceding lightheadedness, chest pain shortness of breath or palpitations.  Denied recent fever or chills.  Has a cough.  Denied nausea, vomiting abdominal pain, dysuria or change in bowel habits.  Has chronic pain for which she is on chronic narcotics.  On arrival of EMS he was placed on 2 L O2 due to low O2 sat on room air. ED course: On arrival, afebrile, BP 142/89 pulse 90-102, respirations 20 with O2 sat 97% on room air.  While in the ER he desaturated to 86% on room air improving to 95% on 2 L.  Blood work mostly unremarkable with normal WBC, hemoglobin.  VBG with normal pH and PCO2.  Chemistries significant only for sodium of 131.  Lactic acid 1.3 EtOH level less than 10. EKG, personally reviewed and interpreted: Sinus rhythm with incomplete RBBB, no acute ST-T wave changes Imaging: Trauma imaging significant for right mid tibial fracture and proximal fibula fracture Diagnostic x-rays of left femur left knee unremarkable CT head and C-spine with no acute findings Chest x-ray: Left basilar pneumonia worrisome for pneumonia or aspiration  Patient was evaluated by orthopedist, Dr. Rosita Kea in the emergency room who recommended stabilization of medical pulmonary issues prior to surgical correction.  Patient received Rocephin, azithromycin, duo nebs and Solu-Medrol in the emergency  room.  Hospitalist consulted for admission.  Review of Systems: As per HPI otherwise all other systems on review of systems negative.    Past Medical History:  Diagnosis Date  . COPD (chronic obstructive pulmonary disease) (HCC)   . Gout   . Hypertension     Past Surgical History:  Procedure Laterality Date  . BACK SURGERY    . CARDIAC SURGERY    . CERVICAL DISC SURGERY       reports that he has been smoking cigarettes. He has never used smokeless tobacco. He reports current alcohol use of about 24.0 standard drinks of alcohol per week. He reports current drug use. Drug: Marijuana.  No Known Allergies  History reviewed. No pertinent family history.    Prior to Admission medications   Medication Sig Start Date End Date Taking? Authorizing Provider  aspirin 81 MG chewable tablet Chew by mouth daily.   Yes [provider]  furosemide (LASIX) 40 MG tablet Take 40 mg by mouth daily. 05/13/20  Yes [provider]  gabapentin (NEURONTIN) 300 MG capsule Take 900 mg by mouth 3 (three) times daily. Reported on 09/15/2015 12/10/14  Yes [provider]  HYDROcodone-acetaminophen (NORCO/VICODIN) 5-325 MG tablet Take 1 tablet by mouth every 4 (four) hours as needed for pain or severe pain. 12/23/19  Yes [provider]  metoprolol tartrate (LOPRESSOR) 25 MG tablet Take 25 mg by mouth 2 (two) times daily. Reported on 09/15/2015 10/24/10  Yes [provider]  omeprazole (PRILOSEC) 40 MG  capsule Take 40 mg by mouth daily. 07/30/18  Yes [provider]  PARoxetine (PAXIL) 10 MG tablet Take 10 mg by mouth daily. 08/29/18  Yes [provider]  potassium chloride SA (KLOR-CON) 20 MEQ tablet Take 20 mEq by mouth daily. 05/13/20  Yes [provider]  pravastatin (PRAVACHOL) 20 MG tablet Take 80 mg by mouth daily. Reported on 09/15/2015 10/24/10  Yes [provider]  TRELEGY ELLIPTA 100-62.5-25 MCG/INH AEPB Inhale 2 puffs into the  lungs daily.  08/29/18  Yes [provider]  albuterol (VENTOLIN HFA) 108 (90 Base) MCG/ACT inhaler Take 2 puffs by mouth every 4 (four) hours as needed. Reported on 09/15/2015 10/24/10   [provider]  COLCRYS 0.6 MG tablet Take 0.6 mg by mouth daily.  08/29/18   [provider]    Physical Exam: Vitals:   06/05/20 1738 06/05/20 1830 06/05/20 1900 06/05/20 2030  BP: 139/79 100/70 140/82 (!) 142/83  Pulse: (!) 101 (!) 103 (!) 105 93  Resp: 18 15 18 17   Temp:      TempSrc:      SpO2: 93% 97% 92% 94%  Weight:      Height:         Vitals:   06/05/20 1738 06/05/20 1830 06/05/20 1900 06/05/20 2030  BP: 139/79 100/70 140/82 (!) 142/83  Pulse: (!) 101 (!) 103 (!) 105 93  Resp: 18 15 18 17   Temp:      TempSrc:      SpO2: 93% 97% 92% 94%  Weight:      Height:          Constitutional: Alert and oriented x 3 . Not in any apparent distress HEENT:      Head: Normocephalic and atraumatic.         Eyes: PERLA, EOMI, Conjunctivae are normal. Sclera is non-icteric.       Mouth/Throat: Mucous membranes are moist.       Neck: Supple with no signs of meningismus. Cardiovascular: Regular rate and rhythm. No murmurs, gallops, or rubs. 2+ symmetrical distal pulses are present . No JVD. No LE edema Respiratory: Respiratory effort normal .Lungs sounds clear bilaterally. No wheezes, crackles, or rhonchi.  Gastrointestinal: Soft, non tender, and non distended with positive bowel sounds.  Genitourinary: No CVA tenderness. Musculoskeletal:  Pain right knee and lower leg. No cyanosis, or erythema of extremities. Neurologic:  Face is symmetric. Moving all extremities. No gross focal neurologic deficits . Skin: Skin is warm, dry.  No rash or ulcers Psychiatric: Mood and affect are normal    Labs on Admission: I have personally reviewed following labs and imaging studies  CBC: Recent Labs  Lab 06/05/20 1805  WBC 10.5  NEUTROABS 8.5*  HGB 13.4  HCT 40.0  MCV 87.5   PLT 417*   Basic Metabolic Panel: Recent Labs  Lab 06/05/20 1805  NA 131*  K 4.7  CL 94*  CO2 25  GLUCOSE 120*  BUN 12  CREATININE 0.87  CALCIUM 8.4*   GFR: Estimated Creatinine Clearance: 96.5 mL/min (by C-G formula based on SCr of 0.87 mg/dL). Liver Function Tests: Recent Labs  Lab 06/05/20 1805  AST 26  ALT 15  ALKPHOS 78  BILITOT 1.2  PROT 7.1  ALBUMIN 3.0*   No results for input(s): LIPASE, AMYLASE in the last 168 hours. No results for input(s): AMMONIA in the last 168 hours. Coagulation Profile: No results for input(s): INR, PROTIME in the last 168 hours. Cardiac Enzymes: No results  for input(s): CKTOTAL, CKMB, CKMBINDEX, TROPONINI in the last 168 hours. BNP (last 3 results) No results for input(s): PROBNP in the last 8760 hours. HbA1C: No results for input(s): HGBA1C in the last 72 hours. CBG: No results for input(s): GLUCAP in the last 168 hours. Lipid Profile: No results for input(s): CHOL, HDL, LDLCALC, TRIG, CHOLHDL, LDLDIRECT in the last 72 hours. Thyroid Function Tests: No results for input(s): TSH, T4TOTAL, FREET4, T3FREE, THYROIDAB in the last 72 hours. Anemia Panel: No results for input(s): VITAMINB12, FOLATE, FERRITIN, TIBC, IRON, RETICCTPCT in the last 72 hours. Urine analysis:    Component Value Date/Time   COLORURINE AMBER (A) 10/01/2018 0123   APPEARANCEUR CLEAR (A) 10/01/2018 0123   LABSPEC 1.014 10/01/2018 0123   PHURINE 6.0 10/01/2018 0123   GLUCOSEU NEGATIVE 10/01/2018 0123   HGBUR NEGATIVE 10/01/2018 0123   BILIRUBINUR NEGATIVE 10/01/2018 0123   KETONESUR NEGATIVE 10/01/2018 0123   PROTEINUR NEGATIVE 10/01/2018 0123   NITRITE NEGATIVE 10/01/2018 0123   LEUKOCYTESUR NEGATIVE 10/01/2018 0123    Radiological Exams on Admission: DG Chest 1 View  Result Date: 06/05/2020 CLINICAL DATA:  Leg pain after fall. EXAM: CHEST  1 VIEW COMPARISON:  Jul 09, 2013 FINDINGS: There is new infiltrate in the right lung base. Rounded opacity  projected over the right upper lung in the perihilar region. The heart, hila, and mediastinum are otherwise unremarkable. Skin fold seen over the lateral left lung base. No pneumothorax. No other abnormalities. IMPRESSION: 1. New infiltrate in the right base worrisome for pneumonia or aspiration. Recommend attention on short-term follow-up imaging after treatment. 2. Rounded opacity over the right upper lung is favored to represent confluence of shadows. Recommend attention on follow-up. Electronically Signed   By: Gerome Sam III M.D   On: 06/05/2020 18:12   DG Knee 1-2 Views Left  Result Date: 06/05/2020 CLINICAL DATA:  Pain after fall EXAM: LEFT KNEE - 1-2 VIEW COMPARISON:  None. FINDINGS: The study is limited due to positioning. No fractures, dislocation, or joint effusion identified. Vascular calcifications. IMPRESSION: Limited study due to positioning.  No abnormalities noted. Electronically Signed   By: Gerome Sam III M.D   On: 06/05/2020 18:10   DG Tibia/Fibula Right  Result Date: 06/05/2020 CLINICAL DATA:  Pain after fall EXAM: RIGHT TIBIA AND FIBULA - 2 VIEW COMPARISON:  None. FINDINGS: There is a lucency associated with the medial aspect of the proximal fibula, not definitely extending through the entire shaft. No other fibular abnormalities. There is a comminuted mildly displaced fracture through the mid tibial diaphysis. IMPRESSION: 1. Comminuted mildly displaced fracture through the mid tibial diaphysis. 2. There is a lucency associated with the medial aspect of the proximal fibula, not definitely extending through the entire shaft. A subtle fracture is not excluded. Electronically Signed   By: Gerome Sam III M.D   On: 06/05/2020 18:09   CT Head Wo Contrast  Result Date: 06/05/2020 CLINICAL DATA:  Fall EXAM: CT HEAD WITHOUT CONTRAST TECHNIQUE: Contiguous axial images were obtained from the base of the skull through the vertex without intravenous contrast. COMPARISON:  09/19/2018  FINDINGS: Brain: No acute intracranial abnormality. Specifically, no hemorrhage, hydrocephalus, mass lesion, acute infarction, or significant intracranial injury. Vascular: No hyperdense vessel or unexpected calcification. Skull: No acute calvarial abnormality. Sinuses/Orbits: No acute findings Other: None IMPRESSION: No acute intracranial abnormality. Electronically Signed   By: Charlett Nose M.D.   On: 06/05/2020 20:06   CT Cervical Spine Wo Contrast  Result Date: 06/05/2020 CLINICAL DATA:  Fall EXAM: CT CERVICAL SPINE WITHOUT CONTRAST TECHNIQUE: Multidetector CT imaging of the cervical spine was performed without intravenous contrast. Multiplanar CT image reconstructions were also generated. COMPARISON:  12/24/2015 FINDINGS: Alignment: Slight anterolisthesis of C5 on C6 related to facet disease. Skull base and vertebrae: No acute fracture. No primary bone lesion or focal pathologic process. Soft tissues and spinal canal: No prevertebral fluid or swelling. No visible canal hematoma. Disc levels: Posterior fusion from C2-C5. Degenerative disc disease and facet disease diffusely. Upper chest: No acute findings Other: None IMPRESSION: Postoperative and degenerative changes in the cervical spine. No acute bony abnormality. Electronically Signed   By: Charlett NoseKevin  Dover M.D.   On: 06/05/2020 20:11   CT Knee Left Wo Contrast  Result Date: 06/05/2020 CLINICAL DATA:  Trauma EXAM: CT OF THE LEFT KNEE WITHOUT CONTRAST TECHNIQUE: Multidetector CT imaging of the left knee was performed according to the standard protocol. Multiplanar CT image reconstructions were also generated. COMPARISON:  Plain films earlier today FINDINGS: Bones/Joint/Cartilage Diffuse osteopenia. No fracture at the left knee. Old healed proximal fibular fracture. Previously seen mid tibial fracture by plain films not visualized on this study. Ligaments Suboptimally assessed by CT. Muscles and Tendons Grossly intact Soft tissues No significant joint  effusion.  Diffuse vascular calcifications. IMPRESSION: No visible fracture at the left knee. Mid tibial fracture not imaged on this CT study. Trace joint effusion. Ligaments suboptimally assessed by CT. If there is high concern for ligamentous injury, MRI would be recommended for further evaluation. Electronically Signed   By: Charlett NoseKevin  Dover M.D.   On: 06/05/2020 20:28   DG Femur Min 2 Views Left  Result Date: 06/05/2020 CLINICAL DATA:  Pain after fall. EXAM: LEFT FEMUR 2 VIEWS COMPARISON:  None. FINDINGS: There is no evidence of fracture or other focal bone lesions. Soft tissues are unremarkable. IMPRESSION: Negative. Electronically Signed   By: Gerome Samavid  Williams III M.D   On: 06/05/2020 18:07     Assessment/Plan 65 year old male with history of COPD, gout, HTN, spinal surgery on chronic narcotics, presenting following an accidental fall onto his right side, sustaining right tibial fracture with work-up also revealing hypoxia with RLL pneumonia on chest x-ray.     RLL pneumonia, possible aspiration   Acute respiratory failure with hypoxia (HCC)   COPD with possible mild exacerbation -Patient was hypoxic to the 80s with EMS and placed on O2.  Again hypoxic in the emergency room with O2 sats 86 on room air improving to 95 on 2 L and also had increased work of breathing treated with DuoNeb -VBG with normal pH and PCO2.  Patient was afebrile and without leukocytosis and normal lactic acid -Chest x-ray with left basilar infiltrate worrisome for pneumonia or aspiration -Rocephin and azithromycin -DuoNebs as needed -Supplemental oxygen to keep sats over 92%    Closed displaced spiral fracture of shaft of right tibia   Accidental fall -Trauma imaging reveals bilateral fracture of right tibia and proximal fibula but otherwise no acute injury -Pain control -Seen by Dr. Sherrell PullerMendez in the ER.  Note appreciated -Orthopedic consult to follow while being cleared for operative repair  Acute urinary  retention -Following admission, patient complained of not urinating and bladder scan showed greater than 999 mils -Foley for urinary retention ordered -Flomax -Urology consult in the a.m.  Alcohol binge drinking -Patient admits to drinking on weekends, Friday and Saturday will drink a 12 pack of beer each day -EtOH level less than 10 -CIWA not ordered at this time  Hypertension -Continue metoprolol and furosemide per med list    Gout -Continue Colcrys    DVT prophylaxis: Lovenox  Code Status: full code  Family Communication:  none  Disposition Plan: Back to previous home environment Consults called: Orthopedics Status:At the time of admission, it appears that the appropriate admission status for this patient is INPATIENT. This is judged to be reasonable and necessary in order to provide the required intensity of service to ensure the patient's safety given the presenting symptoms, physical exam findings, and initial radiographic and laboratory data in the context of their  Comorbid conditions.   Patient requires inpatient status due to high intensity of service, high risk for further deterioration and high frequency of surveillance required.   I certify that at the point of admission it is my clinical judgment that the patient will require inpatient hospital care spanning beyond 2 midnights     Andris Baumann MD [facility]     06/05/2020, 9:01 PM

## 2020-06-05 NOTE — ED Triage Notes (Addendum)
Pt via EMS from home. Pt here r/t mechanical around 3:00pm this afternoon. Pt states that he was trying to pivot to go to the restroom. Pt landed on his R leg. Pt states that he is having in the R shin and L knee. Per EMS, pt's L leg was "bowed." Denies head injury. Denies blood thinner. EMS also reported that pt's O2 decreased. Pt placed on 2L [place], pt's RA on arrival is 97%. Pt has a hx of gout. Pt is A&Ox4 and NAD.   EMS gave of Fentanyl and 4mg  of Zofran en route.

## 2020-06-05 NOTE — ED Notes (Signed)
Pt oxygen saturation dropped to 86% on RA. Placed pt on 2L O2 nasal cannula. Pt's oxygen saturation is at 95% on 2L nasal cannula.

## 2020-06-06 ENCOUNTER — Encounter: Payer: Self-pay | Admitting: Internal Medicine

## 2020-06-06 DIAGNOSIS — F1010 Alcohol abuse, uncomplicated: Secondary | ICD-10-CM

## 2020-06-06 LAB — BASIC METABOLIC PANEL
Anion gap: 9 (ref 5–15)
BUN: 13 mg/dL (ref 8–23)
CO2: 26 mmol/L (ref 22–32)
Calcium: 8.3 mg/dL — ABNORMAL LOW (ref 8.9–10.3)
Chloride: 96 mmol/L — ABNORMAL LOW (ref 98–111)
Creatinine, Ser: 0.72 mg/dL (ref 0.61–1.24)
GFR, Estimated: >60 mL/min (ref >60)
Glucose, Bld: 152 mg/dL — ABNORMAL HIGH (ref 70–99)
Potassium: 4.4 mmol/L (ref 3.5–5.1)
Sodium: 131 mmol/L — ABNORMAL LOW (ref 135–145)

## 2020-06-06 LAB — BRAIN NATRIURETIC PEPTIDE: B Natriuretic Peptide: 525 pg/mL — ABNORMAL HIGH (ref 0.0–100.0)

## 2020-06-06 LAB — HIV ANTIBODY (ROUTINE TESTING W REFLEX): HIV Screen 4th Generation wRfx: NONREACTIVE

## 2020-06-06 LAB — OSMOLALITY: Osmolality: 282 mOsm/kg (ref 275–295)

## 2020-06-06 LAB — PHOSPHORUS: Phosphorus: 3.5 mg/dL (ref 2.5–4.6)

## 2020-06-06 LAB — MAGNESIUM: Magnesium: 1.9 mg/dL (ref 1.7–2.4)

## 2020-06-06 MED ORDER — LORATADINE 10 MG PO TABS
10.0000 mg | ORAL_TABLET | Freq: Every day | ORAL | Status: DC
  Administered 2020-06-06 – 2020-06-11 (×6): 10 mg via ORAL
  Filled 2020-06-06 (×6): qty 1

## 2020-06-06 MED ORDER — SODIUM CHLORIDE 1 G PO TABS
2.0000 g | ORAL_TABLET | Freq: Three times a day (TID) | ORAL | Status: AC
  Administered 2020-06-06 (×2): 2 g via ORAL
  Filled 2020-06-06 (×2): qty 2

## 2020-06-06 MED ORDER — BENZONATATE 100 MG PO CAPS: 100.0000 mg | ORAL_CAPSULE | Freq: Three times a day (TID) | ORAL | Status: DC | PRN

## 2020-06-06 MED ORDER — GUAIFENESIN ER 600 MG PO TB12
600.0000 mg | ORAL_TABLET | Freq: Two times a day (BID) | ORAL | Status: DC
  Administered 2020-06-06 – 2020-06-11 (×10): 600 mg via ORAL
  Filled 2020-06-06 (×8): qty 1

## 2020-06-06 MED ORDER — METHOCARBAMOL 500 MG PO TABS
500.0000 mg | ORAL_TABLET | Freq: Four times a day (QID) | ORAL | Status: DC | PRN
  Administered 2020-06-06 – 2020-06-11 (×7): 500 mg via ORAL
  Filled 2020-06-06 (×7): qty 1

## 2020-06-06 MED ORDER — DM-GUAIFENESIN ER 30-600 MG PO TB12
1.0000 | ORAL_TABLET | Freq: Two times a day (BID) | ORAL | Status: DC
  Administered 2020-06-06 – 2020-06-11 (×10): 1 via ORAL
  Filled 2020-06-06 (×12): qty 1

## 2020-06-06 NOTE — Progress Notes (Signed)
[facility] Progress Note  Patient: Aaron Harvey    FIE:[id number]  DOA: 06/05/2020     Date of Service: the patient was seen and examined on 06/06/2020  Chief Complaint  Patient presents with  . Fall   Brief hospital course: from HPI Aaron Harvey is a 65 y.o. male with medical history significant for COPD, gout, HTN, presenting to the ED via EMS following what appears to be an accidental fall onto his right side, in which he tripped and fell as he was trying to go to the bathroom.  He did not hit his head and did not lose consciousness.  Patient was previously in his usual state of health.  Denied preceding lightheadedness, chest pain shortness of breath or palpitations.  Denied recent fever or chills.  Has a cough.  Denied nausea, vomiting abdominal pain, dysuria or change in bowel habits.  Has chronic pain for which she is on chronic narcotics.  On arrival of EMS he was placed on 2 L O2 due to low O2 sat on room air. ED course: On arrival, afebrile, BP 142/89 pulse 90-102, respirations 20 with O2 sat 97% on room air.  While in the ER he desaturated to 86% on room air improving to 95% on 2 L.  Blood work mostly unremarkable with normal WBC, hemoglobin.  VBG with normal pH and PCO2.  Chemistries significant only for sodium of 131.  Lactic acid 1.3 EtOH level less than 10. EKG, personally reviewed and interpreted: Sinus rhythm with incomplete RBBB, no acute ST-T wave changes Imaging: Trauma imaging significant for right mid tibial fracture and proximal fibula fracture Diagnostic x-rays of left femur left knee unremarkable CT head and C-spine with no acute findings Chest x-ray: Left basilar pneumonia worrisome for pneumonia or aspiration  Patient was evaluated by orthopedist, Dr. Rosita Kea in the emergency room who recommended stabilization of medical pulmonary issues prior to surgical correction.  Patient received Rocephin, azithromycin, duo nebs and Solu-Medrol in the emergency room.  Hospitalist  consulted for admission.   Assessment and Plan:  RLL pneumonia, possible aspiration   Acute respiratory failure with hypoxia (HCC)   COPD with possible mild exacerbation -Patient was hypoxic to the 80s with EMS and placed on O2.  Again hypoxic in the emergency room with O2 sats 86 on room air improving to 95 on 2 L and also had increased work of breathing treated with DuoNeb -VBG with normal pH and PCO2.  Patient was afebrile and without leukocytosis and normal lactic acid -Chest x-ray with left basilar infiltrate worrisome for pneumonia or aspiration -Rocephin and azithromycin -DuoNebs as needed -Supplemental oxygen to keep sats over 92% -Mucinex twice daily, Robitussin-DM as needed and Tessalon Perles as needed Started loratadine 10 mg p.o. daily     Closed displaced spiral fracture of shaft of right tibia   Accidental fall -Trauma imaging reveals bilateral fracture of right tibia and proximal fibula but otherwise no acute injury -Pain control -Seen by Dr. Sherrell Puller in the ER.  Note appreciated -Orthopedic consult to follow while being cleared for operative repair BNP elevated possible CHF and valvular issues Follow 2D echocardiogram  Acute urinary retention -Following admission, patient complained of not urinating and bladder scan showed greater than 999 mils -Foley for urinary retention ordered -Flomax -Urology consult in the a.m.  Alcohol binge drinking -Patient admits to drinking on weekends, Friday and Saturday, drink a 12 pack of beer each day -EtOH level less than 10 -CIWA not ordered at this time --  Denies any withdrawal symptoms Patient also uses pot and smoke cigarettes.  Drug abuse abstinence counseling done UDS pending  Hyponatremia, multifactorial, CHF, nutritional deficiency vs Lasix  serum osmolality 282 within normal range Salt tablets 2 g p.o. x2 doses given Check BMP daily     Hypertension -Continue metoprolol and furosemide per med list     Gout -Continue Colcrys   Body mass index is 29.7 kg/m.   Interventions:        Diet: Heart healthy DVT Prophylaxis: Subcutaneous Lovenox   Advance goals of care discussion: Full code  Family Communication: family was NOT present at bedside, at the time of interview.  The pt provided permission to discuss medical plan with the family. Opportunity was given to ask question and all questions were answered satisfactorily.   Disposition:  Pt is from Home, admitted with fall, fracture right tibia and fibula, pneumonia, respiratory failure, still has respiratory failure, fracture, surgical intervention pending, which precludes a safe discharge. Discharge to SNF, when surgical intervention will be done and cleared by orthopedics.  Subjective: No overnight issues, patient was admitted due to acute respiratory failure, found to have pneumonia and right tibial fracture.  Patient is still having severe pain 8/10 and also has mild cough and shortness of breath.  Patient was AO x2, not aware of year and month. Patient denies any chest pain or palpitations, no abdominal pain  Physical Exam: General:  alert oriented to place and person.  Appear in mild distress, affect appropriate Eyes: PERRLA ENT: Oral Mucosa Clear, moist  Neck: no JVD,  Cardiovascular: S1 and S2 Present, no Murmur,  Respiratory: good respiratory effort, Bilateral Air entry equal and Decreased, bilateral crackles, no wheezes Abdomen: Bowel Sound present, Soft and no tenderness,  Skin: no rashes  Extremities: non Pedal edema, no calf tenderness Neurologic: without any new focal findings Gait not checked due to patient safety concerns  Vitals:   06/06/20 0437 06/06/20 0759 06/06/20 1203 06/06/20 1209  BP: (!) 121/91 116/83 127/80 (!) 141/110  Pulse: 74 71 73 67  Resp: 16 18 18 15   Temp: 98 F (36.7 C) 98.1 F (36.7 C) 97.8 F (36.6 C) 98.1 F (36.7 C)  TempSrc:      SpO2: 97% 96% 94% 99%  Weight:      Height:         Intake/Output Summary (Last 24 hours) at 06/06/2020 1256 Last data filed at 06/06/2020 [id number] Gross per 24 hour  Intake --  Output 1075 ml  Net -1075 ml   Filed Weights   06/05/20 1557 06/05/20 2245  Weight: 98.9 kg 88.6 kg    Data Reviewed: I have personally reviewed and interpreted daily labs, tele strips, imagings as discussed above. I reviewed all nursing notes, pharmacy notes, vitals, pertinent old records I have discussed plan of care as described above with RN and patient/family.  CBC: Recent Labs  Lab 06/05/20 1805 06/05/20 2259  WBC 10.5 11.5*  NEUTROABS 8.5*  --   HGB 13.4 12.7*  HCT 40.0 38.8*  MCV 87.5 88.6  PLT 417* 389   Basic Metabolic Panel: Recent Labs  Lab 06/05/20 1805 06/05/20 2259 06/06/20 0859  NA 131*  --  131*  K 4.7  --  4.4  CL 94*  --  96*  CO2 25  --  26  GLUCOSE 120*  --  152*  BUN 12  --  13  CREATININE 0.87 0.89 0.72  CALCIUM 8.4*  --  8.3*  MG  --   --  1.9  PHOS  --   --  3.5    Studies: DG Chest 1 View  Result Date: 06/05/2020 CLINICAL DATA:  Leg pain after fall. EXAM: CHEST  1 VIEW COMPARISON:  Jul 09, 2013 FINDINGS: There is new infiltrate in the right lung base. Rounded opacity projected over the right upper lung in the perihilar region. The heart, hila, and mediastinum are otherwise unremarkable. Skin fold seen over the lateral left lung base. No pneumothorax. No other abnormalities. IMPRESSION: 1. New infiltrate in the right base worrisome for pneumonia or aspiration. Recommend attention on short-term follow-up imaging after treatment. 2. Rounded opacity over the right upper lung is favored to represent confluence of shadows. Recommend attention on follow-up. Electronically Signed   By: Gerome Sam III M.D   On: 06/05/2020 18:12   DG Knee 1-2 Views Left  Result Date: 06/05/2020 CLINICAL DATA:  Pain after fall EXAM: LEFT KNEE - 1-2 VIEW COMPARISON:  None. FINDINGS: The study is limited due to positioning. No fractures,  dislocation, or joint effusion identified. Vascular calcifications. IMPRESSION: Limited study due to positioning.  No abnormalities noted. Electronically Signed   By: Gerome Sam III M.D   On: 06/05/2020 18:10   DG Tibia/Fibula Right  Result Date: 06/05/2020 CLINICAL DATA:  Pain after fall EXAM: RIGHT TIBIA AND FIBULA - 2 VIEW COMPARISON:  None. FINDINGS: There is a lucency associated with the medial aspect of the proximal fibula, not definitely extending through the entire shaft. No other fibular abnormalities. There is a comminuted mildly displaced fracture through the mid tibial diaphysis. IMPRESSION: 1. Comminuted mildly displaced fracture through the mid tibial diaphysis. 2. There is a lucency associated with the medial aspect of the proximal fibula, not definitely extending through the entire shaft. A subtle fracture is not excluded. Electronically Signed   By: Gerome Sam III M.D   On: 06/05/2020 18:09   CT Head Wo Contrast  Result Date: 06/05/2020 CLINICAL DATA:  Fall EXAM: CT HEAD WITHOUT CONTRAST TECHNIQUE: Contiguous axial images were obtained from the base of the skull through the vertex without intravenous contrast. COMPARISON:  09/19/2018 FINDINGS: Brain: No acute intracranial abnormality. Specifically, no hemorrhage, hydrocephalus, mass lesion, acute infarction, or significant intracranial injury. Vascular: No hyperdense vessel or unexpected calcification. Skull: No acute calvarial abnormality. Sinuses/Orbits: No acute findings Other: None IMPRESSION: No acute intracranial abnormality. Electronically Signed   By: Charlett Nose M.D.   On: 06/05/2020 20:06   CT Cervical Spine Wo Contrast  Result Date: 06/05/2020 CLINICAL DATA:  Fall EXAM: CT CERVICAL SPINE WITHOUT CONTRAST TECHNIQUE: Multidetector CT imaging of the cervical spine was performed without intravenous contrast. Multiplanar CT image reconstructions were also generated. COMPARISON:  12/24/2015 FINDINGS: Alignment: Slight  anterolisthesis of C5 on C6 related to facet disease. Skull base and vertebrae: No acute fracture. No primary bone lesion or focal pathologic process. Soft tissues and spinal canal: No prevertebral fluid or swelling. No visible canal hematoma. Disc levels: Posterior fusion from C2-C5. Degenerative disc disease and facet disease diffusely. Upper chest: No acute findings Other: None IMPRESSION: Postoperative and degenerative changes in the cervical spine. No acute bony abnormality. Electronically Signed   By: Charlett Nose M.D.   On: 06/05/2020 20:11   CT Knee Left Wo Contrast  Result Date: 06/05/2020 CLINICAL DATA:  Trauma EXAM: CT OF THE LEFT KNEE WITHOUT CONTRAST TECHNIQUE: Multidetector CT imaging of the left knee was performed according to the standard protocol. Multiplanar CT image reconstructions were also generated. COMPARISON:  Plain films  earlier today FINDINGS: Bones/Joint/Cartilage Diffuse osteopenia. No fracture at the left knee. Old healed proximal fibular fracture. Previously seen mid tibial fracture by plain films not visualized on this study. Ligaments Suboptimally assessed by CT. Muscles and Tendons Grossly intact Soft tissues No significant joint effusion.  Diffuse vascular calcifications. IMPRESSION: No visible fracture at the left knee. Mid tibial fracture not imaged on this CT study. Trace joint effusion. Ligaments suboptimally assessed by CT. If there is high concern for ligamentous injury, MRI would be recommended for further evaluation. Electronically Signed   By: Charlett Nose M.D.   On: 06/05/2020 20:28   DG Femur Min 2 Views Left  Result Date: 06/05/2020 CLINICAL DATA:  Pain after fall. EXAM: LEFT FEMUR 2 VIEWS COMPARISON:  None. FINDINGS: There is no evidence of fracture or other focal bone lesions. Soft tissues are unremarkable. IMPRESSION: Negative. Electronically Signed   By: Gerome Sam III M.D   On: 06/05/2020 18:07    Scheduled Meds: . enoxaparin (LOVENOX) injection  50 mg  Subcutaneous Q24H  . furosemide  40 mg Oral Daily  . metoprolol tartrate  25 mg Oral BID  . pantoprazole  40 mg Oral Daily  . PARoxetine  10 mg Oral Daily  . potassium chloride SA  20 mEq Oral Daily  . pravastatin  80 mg Oral QHS  . tamsulosin  0.4 mg Oral QPC supper   Continuous Infusions: . azithromycin    . cefTRIAXone (ROCEPHIN)  IV     PRN Meds: HYDROcodone-acetaminophen, ipratropium-albuterol  Time spent: 35 minutes  Author: Gillis Santa. MD [facility] 06/06/2020 12:56 PM  To reach On-call, see care teams to locate the attending and reach out to them via [email or web address]. If 7PM-7AM, please contact night-coverage If you still have difficulty reaching the attending provider, please page the [facility] (Director on Call) for [facility] on amion for assistance.

## 2020-06-06 NOTE — Progress Notes (Signed)
Pt arrived from the ED c/o pain in his pelvic area. Bladder scan performed- results >999. Pt states that he informed the nurse in ED that he could not be and having pain. Nothing was done. MD notified, straight cath performed and 975 ml removed.

## 2020-06-07 ENCOUNTER — Inpatient Hospital Stay: Payer: Medicare Other

## 2020-06-07 ENCOUNTER — Inpatient Hospital Stay
Admit: 2020-06-07 | Discharge: 2020-06-07 | Disposition: A | Discharge status: Home / Self Care | Payer: Medicare Other | Attending: Student | Admitting: Student

## 2020-06-07 ENCOUNTER — Encounter: Payer: Self-pay | Admitting: Internal Medicine

## 2020-06-07 LAB — BASIC METABOLIC PANEL
Anion gap: 8 (ref 5–15)
BUN: 17 mg/dL (ref 8–23)
CO2: 28 mmol/L (ref 22–32)
Calcium: 8.7 mg/dL — ABNORMAL LOW (ref 8.9–10.3)
Chloride: 101 mmol/L (ref 98–111)
Creatinine, Ser: 1.12 mg/dL (ref 0.61–1.24)
GFR, Estimated: >60 mL/min (ref >60)
Glucose, Bld: 105 mg/dL — ABNORMAL HIGH (ref 70–99)
Potassium: 4.3 mmol/L (ref 3.5–5.1)
Sodium: 137 mmol/L (ref 135–145)

## 2020-06-07 LAB — ECHOCARDIOGRAM COMPLETE
AR max vel: 2.26 cm2
AV Area VTI: 2.46 cm2
AV Area mean vel: 2.35 cm2
AV Mean grad: 3 mmHg
AV Peak grad: 6.7 mmHg
Ao pk vel: 1.29 m/s
Area-P 1/2: 2.6 cm2
Height: 68 in
MV VTI: 1.91 cm2
S' Lateral: 2.7 cm
Weight: 3125.24 oz

## 2020-06-07 LAB — CBC
HCT: 33.3 % — ABNORMAL LOW (ref 39.0–52.0)
Hemoglobin: 10.6 g/dL — ABNORMAL LOW (ref 13.0–17.0)
MCH: 29.1 pg (ref 26.0–34.0)
MCHC: 31.8 g/dL (ref 30.0–36.0)
MCV: 91.5 fL (ref 80.0–100.0)
Platelets: 384 10*3/uL (ref 150–400)
RBC: 3.64 MIL/uL — ABNORMAL LOW (ref 4.22–5.81)
RDW: 17.2 % — ABNORMAL HIGH (ref 11.5–15.5)
WBC: 12.4 10*3/uL — ABNORMAL HIGH (ref 4.0–10.5)
nRBC: 0 % (ref 0.0–0.2)

## 2020-06-07 LAB — D-DIMER, QUANTITATIVE: D-Dimer, Quant: 1.43 ug/mL-FEU — ABNORMAL HIGH (ref 0.00–0.50)

## 2020-06-07 LAB — MAGNESIUM: Magnesium: 2 mg/dL (ref 1.7–2.4)

## 2020-06-07 LAB — PHOSPHORUS: Phosphorus: 3.5 mg/dL (ref 2.5–4.6)

## 2020-06-07 MED ORDER — SODIUM CHLORIDE 0.9 % IV SOLN
3.0000 g | Freq: Four times a day (QID) | INTRAVENOUS | Status: DC
  Administered 2020-06-07 – 2020-06-11 (×16): 3 g via INTRAVENOUS
  Filled 2020-06-07: qty 8
  Filled 2020-06-07: qty 3
  Filled 2020-06-07 (×2): qty 8
  Filled 2020-06-07 (×2): qty 3
  Filled 2020-06-07: qty 8
  Filled 2020-06-07: qty 3
  Filled 2020-06-07 (×4): qty 8
  Filled 2020-06-07: qty 3
  Filled 2020-06-07: qty 8
  Filled 2020-06-07 (×2): qty 3
  Filled 2020-06-07 (×4): qty 8
  Filled 2020-06-07 (×2): qty 3

## 2020-06-07 MED ORDER — POLYETHYLENE GLYCOL 3350 17 G PO PACK
17.0000 g | PACK | Freq: Every day | ORAL | Status: DC
  Administered 2020-06-07 – 2020-06-08 (×2): 17 g via ORAL
  Filled 2020-06-07 (×2): qty 1

## 2020-06-07 MED ORDER — IOHEXOL 350 MG/ML SOLN
75.0000 mL | Freq: Once | INTRAVENOUS | Status: AC | PRN
  Administered 2020-06-07: 75 mL via INTRAVENOUS

## 2020-06-07 NOTE — Progress Notes (Addendum)
[facility] Progress Note  Patient: Aaron Harvey    YBO:[id number]  DOA: 06/05/2020     Date of Service: the patient was seen and examined on 06/07/2020  Chief Complaint  Patient presents with  . Fall   Brief hospital course: from HPI Aaron Harvey is a 65 y.o. male with medical history significant for COPD, gout, HTN, presenting to the ED via EMS following what appears to be an accidental fall onto his right side, in which he tripped and fell as he was trying to go to the bathroom.  He did not hit his head and did not lose consciousness.  Patient was previously in his usual state of health.  Denied preceding lightheadedness, chest pain shortness of breath or palpitations.  Denied recent fever or chills.  Has a cough.  Denied nausea, vomiting abdominal pain, dysuria or change in bowel habits.  Has chronic pain for which she is on chronic narcotics.  On arrival of EMS he was placed on 2 L O2 due to low O2 sat on room air. ED course: On arrival, afebrile, BP 142/89 pulse 90-102, respirations 20 with O2 sat 97% on room air.  While in the ER he desaturated to 86% on room air improving to 95% on 2 L.  Blood work mostly unremarkable with normal WBC, hemoglobin.  VBG with normal pH and PCO2.  Chemistries significant only for sodium of 131.  Lactic acid 1.3 EtOH level less than 10. EKG, personally reviewed and interpreted: Sinus rhythm with incomplete RBBB, no acute ST-T wave changes Imaging: Trauma imaging significant for right mid tibial fracture and proximal fibula fracture Diagnostic x-rays of left femur left knee unremarkable CT head and C-spine with no acute findings Chest x-ray: Left basilar pneumonia worrisome for pneumonia or aspiration  Patient was evaluated by orthopedist, Dr. Rosita Kea in the emergency room who recommended stabilization of medical pulmonary issues prior to surgical correction.  Patient received Rocephin, azithromycin, duo nebs and Solu-Medrol in the emergency room.  Hospitalist  consulted for admission.   Assessment and Plan:  RLL pneumonia, possible aspiration Acute respiratory failure with hypoxia (HCC) COPD with possible mild exacerbation Patient was hypoxic to the 80s with EMS and placed on O2.  Again hypoxic in the emergency room with O2 sats 86 on room air improving to 95 on 2 L and also had increased work of breathing treated with DuoNeb. VBG with normal pH and PCO2. Patient was afebrile and without leukocytosis and normal lactic acid. He reports he has been on O2 in the past, not currently. CXR with RLL basilar infiltrate concerning for possible pna, possible aspiration. Pt does have a hx of etoh abuse. This could be patient's baseline -stop ceftriaxone (4/2-4/4), start unasyn given concern for possible aspiration. Clinically is stable. - f/u TTE (ordered, result pending). Continue home lasix for now - d dimer elevated will check CTA of chest -DuoNebs as needed -Supplemental oxygen to keep sats over 92% -Mucinex twice daily, Robitussin-DM as needed and Tessalon Perles as needed Started loratadine 10 mg p.o. daily    Closed displaced spiral fracture of shaft of right tibia   Accidental fall -Trauma imaging reveals bilateral fracture of right tibia and proximal fibula but otherwise no acute injury -Pain control with norco, start miralax -Seen by Dr. Sherrell Puller in the ER.  Note appreciated -Orthopedic consult to follow while being cleared for operative repair  Acute urinary retention -Following admission, patient complained of not urinating and bladder scan showed greater than 999 mils -  Foley for urinary retention ordered, now discontinued, is urinating - bladder scan prn and will monitor - flomax started  Alcohol binge drinking -Patient admits to drinking on weekends, Friday and Saturday, drink a 12 pack of beer each day -EtOH level less than 10 -CIWA not ordered at this time, Denies any withdrawal symptoms  Hyponatremia, multifactorial, CHF,  nutritional deficiency vs Lasix  serum osmolality 282 within normal range. Salt tablets 2 g p.o. x2 doses given. Na wnl today - Check BMP daily - consider d/c of lasix pending TTE results  Hypertension -Continue metoprolol and furosemide per med list  Gout -Continue Colcrys   Body mass index is 29.7 kg/m.   Interventions:        Diet: Heart healthy DVT Prophylaxis: Subcutaneous Lovenox   Advance goals of care discussion: Full code  Family Communication: family was NOT present at bedside, at the time of interview.    Disposition:  Pending surgery and pt/ot eval -  Subjective:  Complains of ongoing leg pain. Has appetite, no n/v/d. No cough or fever or dyspnea.   Physical Exam: General:  alert oriented to place and person.  Appear in mild distress, affect appropriate Eyes: PERRLA ENT: Oral Mucosa Clear, moist  Neck: no JVD,  Cardiovascular: S1 and S2 Present, no Murmur,  Respiratory: good respiratory effort, Bilateral Air entry equal and Decreased, bilateral crackles at bases, no wheezes Abdomen: Bowel Sound present, Soft and no tenderness,  Skin: no rashes  Extremities: no Pedal edema, no calf tenderness Neurologic: without any new focal findings   Vitals:   06/06/20 1556 06/06/20 1938 06/07/20 0556 06/07/20 0801  BP: 105/75 135/78 116/80 112/66  Pulse: 68 77 76 78  Resp: 18 20 18 18   Temp: 97.8 F (36.6 C) 98.2 F (36.8 C) 98.4 F (36.9 C) 98 F (36.7 C)  TempSrc:  Oral    SpO2: 95% 98% 99% 98%  Weight:      Height:        Intake/Output Summary (Last 24 hours) at 06/07/2020 1232 Last data filed at 06/07/2020 1012 Gross per 24 hour  Intake 574.2 ml  Output 1025 ml  Net -450.8 ml   Filed Weights   06/05/20 1557 06/05/20 2245  Weight: 98.9 kg 88.6 kg    Data Reviewed: I have personally reviewed and interpreted daily labs, tele strips, imagings as discussed above. I reviewed all nursing notes, pharmacy notes, vitals, pertinent old records I have  discussed plan of care as described above with RN and patient/family.  CBC: Recent Labs  Lab 06/05/20 1805 06/05/20 2259 06/07/20 0514  WBC 10.5 11.5* 12.4*  NEUTROABS 8.5*  --   --   HGB 13.4 12.7* 10.6*  HCT 40.0 38.8* 33.3*  MCV 87.5 88.6 91.5  PLT 417* 389 384   Basic Metabolic Panel: Recent Labs  Lab 06/05/20 1805 06/05/20 2259 06/06/20 0859 06/07/20 0514  NA 131*  --  131* 137  K 4.7  --  4.4 4.3  CL 94*  --  96* 101  CO2 25  --  26 28  GLUCOSE 120*  --  152* 105*  BUN 12  --  13 17  CREATININE 0.87 0.89 0.72 1.12  CALCIUM 8.4*  --  8.3* 8.7*  MG  --   --  1.9 2.0  PHOS  --   --  3.5 3.5    Studies: No results found.  Scheduled Meds: . dextromethorphan-guaiFENesin  1 tablet Oral BID  . enoxaparin (LOVENOX) injection  50  mg Subcutaneous Q24H  . furosemide  40 mg Oral Daily  . guaiFENesin  600 mg Oral BID  . loratadine  10 mg Oral Daily  . metoprolol tartrate  25 mg Oral BID  . pantoprazole  40 mg Oral Daily  . PARoxetine  10 mg Oral Daily  . potassium chloride SA  20 mEq Oral Daily  . pravastatin  80 mg Oral QHS  . tamsulosin  0.4 mg Oral QPC supper   Continuous Infusions: . azithromycin Stopped (06/06/20 2332)  . cefTRIAXone (ROCEPHIN)  IV Stopped (06/06/20 2207)   PRN Meds: benzonatate, HYDROcodone-acetaminophen, ipratropium-albuterol, methocarbamol  Time spent: 35 minutes  Author: Shonna Chock. MD [facility] 06/07/2020 12:32 PM  To reach On-call, see care teams to locate the attending and reach out to them via [email or web address]. If 7PM-7AM, please contact night-coverage If you still have difficulty reaching the attending provider, please page the [facility] (Director on Call) for [facility] on amion for assistance.

## 2020-06-07 NOTE — Progress Notes (Signed)
Notified MD Para March of Hgb trending down by 2 points from 12-10.

## 2020-06-07 NOTE — Consult Note (Signed)
Pharmacy Antibiotic Note  Aaron Harvey is a 65 y.o. male admitted on 06/05/2020 with aspiration pneumonia.   Pt was hypoxic in the ED and placed on 2L O2. Chest x-ray showed new infiltrate in the right base worrisome for pneumonia or aspiration. Pharmacy has been consulted for Unasyn dosing.  Plan: Start Unasyn 3 g IV q6h  Monitor clinical picture and renal function F/U C&S, abx deescalation / LOT   Height: 5\' 8"  (172.7 cm) Weight: 88.6 kg (195 lb 5.2 oz) IBW/kg (Calculated) : 68.4  Temp (24hrs), Avg:98.1 F (36.7 C), Min:97.8 F (36.6 C), Max:98.4 F (36.9 C)  Recent Labs  Lab 06/05/20 1804 06/05/20 1805 06/05/20 2259 06/06/20 0859 06/07/20 0514  WBC  --  10.5 11.5*  --  12.4*  CREATININE  --  0.87 0.89 0.72 1.12  LATICACIDVEN 1.3  --  1.7  --   --     Estimated Creatinine Clearance: 71.1 mL/min (by C-G formula based on SCr of 1.12 mg/dL).    No Known Allergies  Antimicrobials this admission: Ceftriaxone 4/2 >> 4/3 Azithromycin 4/2 >> 4/3 Unasyn 4/3 >>   Dose adjustments this admission:  Microbiology results: 4/2 BCx: NG x 2 days  Thank you for allowing pharmacy to be a part of this patient's care.  6/2, PharmD 06/07/2020 12:56 PM

## 2020-06-07 NOTE — Progress Notes (Signed)
*  PRELIMINARY RESULTS* Echocardiogram 2D Echocardiogram has been performed.  Aaron Harvey 06/07/2020, 1:41 PM

## 2020-06-08 ENCOUNTER — Inpatient Hospital Stay: Payer: Medicare Other

## 2020-06-08 ENCOUNTER — Inpatient Hospital Stay: Payer: Medicare Other | Admitting: Anesthesiology

## 2020-06-08 ENCOUNTER — Encounter: Payer: Self-pay | Admitting: Internal Medicine

## 2020-06-08 ENCOUNTER — Encounter
Admission: EM | Disposition: A | Discharge status: Skilled Nursing Facility | Payer: Self-pay | Source: Home / Self Care | Attending: Obstetrics and Gynecology

## 2020-06-08 HISTORY — PX: TIBIA IM NAIL INSERTION: SHX2516

## 2020-06-08 LAB — CBC
HCT: 34 % — ABNORMAL LOW (ref 39.0–52.0)
Hemoglobin: 11.1 g/dL — ABNORMAL LOW (ref 13.0–17.0)
MCH: 29.8 pg (ref 26.0–34.0)
MCHC: 32.6 g/dL (ref 30.0–36.0)
MCV: 91.4 fL (ref 80.0–100.0)
Platelets: 389 10*3/uL (ref 150–400)
RBC: 3.72 MIL/uL — ABNORMAL LOW (ref 4.22–5.81)
RDW: 17.2 % — ABNORMAL HIGH (ref 11.5–15.5)
WBC: 12.1 10*3/uL — ABNORMAL HIGH (ref 4.0–10.5)
nRBC: 0 % (ref 0.0–0.2)

## 2020-06-08 LAB — GLUCOSE, CAPILLARY
Glucose-Capillary: 67 mg/dL — ABNORMAL LOW (ref 70–99)
Glucose-Capillary: 104 mg/dL — ABNORMAL HIGH (ref 70–99)

## 2020-06-08 LAB — BASIC METABOLIC PANEL
Anion gap: 11 (ref 5–15)
BUN: 17 mg/dL (ref 8–23)
CO2: 29 mmol/L (ref 22–32)
Calcium: 8.9 mg/dL (ref 8.9–10.3)
Chloride: 100 mmol/L (ref 98–111)
Creatinine, Ser: 1.04 mg/dL (ref 0.61–1.24)
GFR, Estimated: >60 mL/min (ref >60)
Glucose, Bld: 98 mg/dL (ref 70–99)
Potassium: 3.7 mmol/L (ref 3.5–5.1)
Sodium: 140 mmol/L (ref 135–145)

## 2020-06-08 SURGERY — INSERTION, INTRAMEDULLARY ROD, TIBIA
Anesthesia: Spinal | Laterality: Right

## 2020-06-08 MED ORDER — CEFAZOLIN SODIUM-DEXTROSE 2-4 GM/100ML-% IV SOLN
2.0000 g | Freq: Once | INTRAVENOUS | Status: AC
  Administered 2020-06-08: 2 g via INTRAVENOUS
  Filled 2020-06-08: qty 100

## 2020-06-08 MED ORDER — POLYETHYLENE GLYCOL 3350 17 G PO PACK
17.0000 g | PACK | Freq: Two times a day (BID) | ORAL | Status: DC
  Administered 2020-06-09 – 2020-06-11 (×3): 17 g via ORAL
  Filled 2020-06-08 (×4): qty 1

## 2020-06-08 MED ORDER — DEXTROSE 50 % IV SOLN
25.0000 mL | Freq: Once | INTRAVENOUS | Status: AC
  Administered 2020-06-08: 25 mL via INTRAVENOUS

## 2020-06-08 MED ORDER — EPHEDRINE SULFATE 50 MG/ML IJ SOLN
INTRAMUSCULAR | Status: DC | PRN
  Administered 2020-06-08: 10 mg via INTRAVENOUS

## 2020-06-08 MED ORDER — LACTATED RINGERS IV SOLN: INTRAVENOUS | Status: DC | PRN

## 2020-06-08 MED ORDER — HYDROMORPHONE HCL 1 MG/ML IJ SOLN
1.0000 mg | INTRAMUSCULAR | Status: DC | PRN
  Administered 2020-06-08 – 2020-06-11 (×11): 1 mg via INTRAVENOUS
  Filled 2020-06-08 (×11): qty 1

## 2020-06-08 MED ORDER — FENTANYL CITRATE (PF) 100 MCG/2ML IJ SOLN
INTRAMUSCULAR | Status: DC | PRN
  Administered 2020-06-08 (×2): 50 ug via INTRAVENOUS

## 2020-06-08 MED ORDER — PROPOFOL 10 MG/ML IV BOLUS
INTRAVENOUS | Status: AC
  Filled 2020-06-08: qty 40

## 2020-06-08 MED ORDER — ASPIRIN 81 MG PO CHEW
81.0000 mg | CHEWABLE_TABLET | Freq: Every day | ORAL | Status: DC
  Administered 2020-06-09 – 2020-06-11 (×3): 81 mg via ORAL
  Filled 2020-06-08 (×3): qty 1

## 2020-06-08 MED ORDER — MIDAZOLAM HCL 2 MG/2ML IJ SOLN
INTRAMUSCULAR | Status: AC
  Filled 2020-06-08: qty 2

## 2020-06-08 MED ORDER — GABAPENTIN 300 MG PO CAPS
300.0000 mg | ORAL_CAPSULE | Freq: Three times a day (TID) | ORAL | Status: DC
  Administered 2020-06-09 – 2020-06-11 (×8): 300 mg via ORAL
  Filled 2020-06-08 (×8): qty 1

## 2020-06-08 MED ORDER — SODIUM CHLORIDE 0.9 % IV SOLN
INTRAVENOUS | Status: DC | PRN
  Administered 2020-06-08: 30 ug/min via INTRAVENOUS

## 2020-06-08 MED ORDER — NEOMYCIN-POLYMYXIN B GU 40-200000 IR SOLN
Status: AC
  Filled 2020-06-08: qty 4

## 2020-06-08 MED ORDER — FENTANYL CITRATE (PF) 100 MCG/2ML IJ SOLN
INTRAMUSCULAR | Status: AC
  Filled 2020-06-08: qty 2

## 2020-06-08 MED ORDER — SODIUM CHLORIDE 0.9 % IV SOLN: INTRAVENOUS | Status: DC | PRN

## 2020-06-08 MED ORDER — COLCHICINE 0.6 MG PO TABS
0.6000 mg | ORAL_TABLET | Freq: Every day | ORAL | Status: DC
  Administered 2020-06-09 – 2020-06-11 (×3): 0.6 mg via ORAL
  Filled 2020-06-08 (×3): qty 1

## 2020-06-08 MED ORDER — PROPOFOL 500 MG/50ML IV EMUL
INTRAVENOUS | Status: DC | PRN
  Administered 2020-06-08: 75 ug/kg/min via INTRAVENOUS

## 2020-06-08 MED ORDER — MIDAZOLAM HCL 2 MG/2ML IJ SOLN
INTRAMUSCULAR | Status: DC | PRN
  Administered 2020-06-08 (×2): 1 mg via INTRAVENOUS

## 2020-06-08 MED ORDER — PROPOFOL 10 MG/ML IV BOLUS
INTRAVENOUS | Status: AC
  Filled 2020-06-08: qty 20

## 2020-06-08 MED ORDER — DEXTROSE 50 % IV SOLN
INTRAVENOUS | Status: AC
  Filled 2020-06-08: qty 50

## 2020-06-08 MED ORDER — FENTANYL CITRATE (PF) 100 MCG/2ML IJ SOLN: 25.0000 ug | INTRAMUSCULAR | Status: DC | PRN

## 2020-06-08 MED ORDER — KETAMINE HCL 50 MG/5ML IJ SOSY
PREFILLED_SYRINGE | INTRAMUSCULAR | Status: AC
  Filled 2020-06-08: qty 5

## 2020-06-08 MED ORDER — FENTANYL CITRATE (PF) 100 MCG/2ML IJ SOLN
25.0000 ug | INTRAMUSCULAR | Status: DC | PRN
  Administered 2020-06-08: 25 ug via INTRAVENOUS

## 2020-06-08 MED ORDER — BUPIVACAINE HCL (PF) 0.5 % IJ SOLN
INTRAMUSCULAR | Status: DC | PRN
  Administered 2020-06-08: 2 mL via INTRATHECAL

## 2020-06-08 MED ORDER — NEOMYCIN-POLYMYXIN B GU 40-200000 IR SOLN
Status: DC | PRN
  Administered 2020-06-08: 4 mL

## 2020-06-08 MED ORDER — FENTANYL CITRATE (PF) 100 MCG/2ML IJ SOLN
INTRAMUSCULAR | Status: AC
  Administered 2020-06-08: 25 ug via INTRAVENOUS
  Filled 2020-06-08: qty 2

## 2020-06-08 SURGICAL SUPPLY — 42 items
BIT DRILL CALIBRATED 4.2 (BIT) ×1 IMPLANT
BIT DRILL SHORT 4.2 (BIT) ×1 IMPLANT
BLADE CLIPPER SURG (BLADE) IMPLANT
CHLORAPREP W/TINT 26 (MISCELLANEOUS) ×2 IMPLANT
COVER WAND RF STERILE (DRAPES) ×2 IMPLANT
CUFF TOURN SGL QUICK 24 (TOURNIQUET CUFF) ×1
CUFF TOURN SGL QUICK 30 (TOURNIQUET CUFF)
CUFF TRNQT CYL 24X4X16.5-23 (TOURNIQUET CUFF) ×1 IMPLANT
CUFF TRNQT CYL 30X4X21-28X (TOURNIQUET CUFF) IMPLANT
DRAPE C-ARM XRAY 36X54 (DRAPES) ×2 IMPLANT
DRAPE C-ARMOR (DRAPES) ×2 IMPLANT
DRILL BIT CALIBRATED 4.2 (BIT) ×2
DRILL BIT SHORT 4.2 (BIT) ×1
ELECT CAUTERY BLADE 6.4 (BLADE) ×2 IMPLANT
ELECT REM PT RETURN 9FT ADLT (ELECTROSURGICAL) ×2
ELECTRODE REM PT RTRN 9FT ADLT (ELECTROSURGICAL) ×1 IMPLANT
GAUZE SPONGE 4X4 12PLY STRL (GAUZE/BANDAGES/DRESSINGS) ×2 IMPLANT
GAUZE XEROFORM 1X8 LF (GAUZE/BANDAGES/DRESSINGS) ×2 IMPLANT
GLOVE SURG SYN 9.0  PF PI (GLOVE) ×3
GLOVE SURG SYN 9.0 PF PI (GLOVE) ×3 IMPLANT
GLOVE SURG UNDER POLY LF SZ9 (GLOVE) ×2 IMPLANT
GOWN SRG 2XL LVL 4 RGLN SLV (GOWNS) ×1 IMPLANT
GOWN STRL NON-REIN 2XL LVL4 (GOWNS) ×1
GOWN STRL REUS W/ TWL LRG LVL3 (GOWN DISPOSABLE) ×1 IMPLANT
GOWN STRL REUS W/TWL LRG LVL3 (GOWN DISPOSABLE) ×1
GUIDEWIRE 3.2X400 (WIRE) ×2 IMPLANT
KIT TURNOVER KIT A (KITS) ×2 IMPLANT
MANIFOLD NEPTUNE II (INSTRUMENTS) ×2 IMPLANT
NAIL TIBIAL W/PROX BEND 10X345 (Nail) ×2 IMPLANT
NS IRRIG 1000ML POUR BTL (IV SOLUTION) ×2 IMPLANT
PACK TOTAL KNEE (MISCELLANEOUS) ×2 IMPLANT
PAD ABD DERMACEA PRESS 5X9 (GAUZE/BANDAGES/DRESSINGS) ×2 IMPLANT
PAD CAST CTTN 4X4 STRL (SOFTGOODS) ×1 IMPLANT
PADDING CAST COTTON 4X4 STRL (SOFTGOODS) ×1
SCALPEL PROTECTED #15 DISP (BLADE) ×4 IMPLANT
SCREW LOCK STAR 5X30 (Screw) ×2 IMPLANT
SCREW LOCK STAR 5X38 (Screw) ×2 IMPLANT
SCREW LOCK STAR 5X44 (Screw) ×2 IMPLANT
STAPLER SKIN PROX 35W (STAPLE) ×2 IMPLANT
STOCKINETTE BIAS CUT 6 980064 (GAUZE/BANDAGES/DRESSINGS) ×2 IMPLANT
SUT VIC AB 0 CT1 36 (SUTURE) ×2 IMPLANT
SUT VIC AB 2-0 CT1 (SUTURE) ×2 IMPLANT

## 2020-06-08 NOTE — Anesthesia Procedure Notes (Signed)
Spinal  Patient location during procedure: OR Start time: 06/08/2020 8:26 PM End time: 06/08/2020 8:32 PM Reason for block: surgical anesthesia Staffing Performed: anesthesiologist  Anesthesiologist: Martha Clan, MD Preanesthetic Checklist Completed: patient identified, IV checked, site marked, risks and benefits discussed, surgical consent, monitors and equipment checked, pre-op evaluation and timeout performed Spinal Block Patient position: sitting Prep: ChloraPrep Patient monitoring: heart rate, cardiac monitor, continuous pulse ox and blood pressure Approach: midline Location: L3-4 Injection technique: single-shot Needle Needle type: Sprotte  Needle gauge: 24 G Needle length: 9 cm Assessment Sensory level: T8 Events: CSF return Additional Notes IV functioning, monitors applied to pt. Expiration date of kit checked and confirmed to be in date. Sterile prep and drape, hand hygiene and sterile gloved used. Pt was positioned and spine was prepped in sterile fashion. Skin was anesthetized with lidocaine. Free flow of clear CSF obtained prior to injecting local anesthetic into CSF x 1 attempt. Spinal needle aspirated freely following injection. Needle was carefully withdrawn, and pt tolerated procedure well. Loss of motor and sensory on exam post injection.

## 2020-06-08 NOTE — Op Note (Signed)
06/08/2020  9:57 PM  PATIENT:  Aaron Harvey  65 y.o. male  PRE-OPERATIVE DIAGNOSIS:  Right Tibial Fracture shaft  POST-OPERATIVE DIAGNOSIS:  same   PROCEDURE:  Procedure(s): INTRAMEDULLARY (IM) NAIL TIBIAL (Right)  SURGEON: Leitha Schuller, MD  ASSISTANTS: None  ANESTHESIA:   spinal  EBL:  Total I/O In: 1000 [I.V.:1000] Out: -   BLOOD ADMINISTERED:none  DRAINS: none   LOCAL MEDICATIONS USED:  NONE  SPECIMEN:  No Specimen  DISPOSITION OF SPECIMEN:  N/A  COUNTS:  YES  TOURNIQUET:  * Missing tourniquet times found for documented tourniquets in log: [id number] *no tourniquet utilized  IMPLANTS: Synthes tibial nail EX 10 x 345 with 3 interlocking screws  DICTATION: .Dragon Dictation patient was brought to the operating room and after adequate spinal anesthesia was obtained because of his pulmonary condition the right leg was prepped and draped in the usual sterile fashion.  After patient identification and timeout procedures C arm was brought in and visualization of the tibial spines carried out and plan for incision made with patellar tendon splitting incision made.  Awl was utilized followed by placing the ball-tipped guidewire down the canal and reaming to 11-1/2 mm rod length determined from the guide wire by subtraction technique.  The 345 x 10 mm rod was then inserted without difficulty with a single proximal interlocking screw placed through the dynamic hole and 2 distal interlocking screws placed medial lateral using freehand technique again without difficulty.  The wounds were thoroughly irrigated and then closed with #1 Vicryl for the patellar tendon 2-0 Vicryl subcutaneously and skin staples with a well-padded dressing with Xeroform 4 x 4's ABD web roll and ABDs around the heel to prevent ulceration.  Followed by bias wrap.  PLAN OF CARE: Admit to inpatient   PATIENT DISPOSITION:  PACU - hemodynamically stable.

## 2020-06-08 NOTE — Transfer of Care (Signed)
Immediate Anesthesia Transfer of Care Note  Patient: Aaron Harvey  Procedure(s) Performed: INTRAMEDULLARY (IM) NAIL TIBIAL (Right )  Patient Location: PACU  Anesthesia Type:spinal  Level of Consciousness: awake and patient cooperative  Airway & Oxygen Therapy: Patient Spontanous Breathing and Patient connected to nasal cannula oxygen  Post-op Assessment: Report given to RN and Post -op Vital signs reviewed and stable  Post vital signs: Reviewed and stable  Last Vitals:  Vitals Value Taken Time  BP 125/75 06/08/20 2208  Temp 36.1 C 06/08/20 2201  Pulse 69 06/08/20 2211  Resp 10 06/08/20 2211  SpO2 99 % 06/08/20 2211  Vitals shown include unvalidated device data.  Last Pain:  Vitals:   06/08/20 2201  TempSrc:   PainSc: Asleep      Patients Stated Pain Goal: 0 (06/07/20 1932)  Complications: No complications documented.

## 2020-06-08 NOTE — Anesthesia Preprocedure Evaluation (Signed)
Anesthesia Evaluation  Patient identified by MRN, date of birth, ID band Patient awake    Reviewed: Allergy & Precautions, H&P , NPO status , Patient's Chart, lab work & pertinent test results, reviewed documented beta blocker date and time   History of Anesthesia Complications Negative for: history of anesthetic complications  Airway Mallampati: III  TM Distance: >3 FB Neck ROM: full    Dental no notable dental hx. (+) Dental Advidsory Given, Edentulous Upper, Edentulous Lower   Pulmonary shortness of breath, with exertion and Long-Term Oxygen Therapy, COPD,  COPD inhaler and oxygen dependent, neg recent URI, Current Smoker,    Pulmonary exam normal breath sounds clear to auscultation       Cardiovascular Exercise Tolerance: Good hypertension, (-) angina+ CAD, + Cardiac Stents and + Peripheral Vascular Disease  (-) Past MI Normal cardiovascular exam(-) dysrhythmias (-) Valvular Problems/Murmurs Rhythm:regular Rate:Normal     Neuro/Psych neg Seizures  Neuromuscular disease (Bell's palsy 10 years ago, resolved) negative psych ROS   GI/Hepatic Neg liver ROS, GERD  ,  Endo/Other  negative endocrine ROS  Renal/GU negative Renal ROS  negative genitourinary   Musculoskeletal   Abdominal   Peds  Hematology negative hematology ROS (+)   Anesthesia Other Findings Past Medical History: No date: COPD (chronic obstructive pulmonary disease) (HCC) No date: Gout No date: Hypertension   Reproductive/Obstetrics negative OB ROS                             Anesthesia Physical Anesthesia Plan  ASA: III  Anesthesia Plan: Spinal   Post-op Pain Management:    Induction:   PONV Risk Score and Plan: 0 and TIVA and Propofol infusion  Airway Management Planned: Natural Airway and Simple Face Mask  Additional Equipment:   Intra-op Plan:   Post-operative Plan:   Informed Consent: I have reviewed the  patients History and Physical, chart, labs and discussed the procedure including the risks, benefits and alternatives for the proposed anesthesia with the patient or authorized representative who has indicated his/her understanding and acceptance.     Dental Advisory Given  Plan Discussed with: Anesthesiologist, CRNA and Surgeon  Anesthesia Plan Comments:         Anesthesia Quick Evaluation

## 2020-06-08 NOTE — Progress Notes (Signed)
Surgery details discussed with patient

## 2020-06-08 NOTE — Progress Notes (Signed)
[facility] Progress Note  Patient: Aaron Harvey    EAV:[id number]  DOA: 06/05/2020     Date of Service: the patient was seen and examined on 06/08/2020  Chief Complaint  Patient presents with  . Fall   Brief hospital course: from HPI Aaron Harvey is a 65 y.o. male with medical history significant for COPD, gout, HTN, presenting to the ED via EMS following what appears to be an accidental fall onto his right side, in which he tripped and fell as he was trying to go to the bathroom.  He did not hit his head and did not lose consciousness.  Patient was previously in his usual state of health.  Denied preceding lightheadedness, chest pain shortness of breath or palpitations.  Denied recent fever or chills.  Has a cough.  Denied nausea, vomiting abdominal pain, dysuria or change in bowel habits.  Has chronic pain for which she is on chronic narcotics.  On arrival of EMS he was placed on 2 L O2 due to low O2 sat on room air. ED course: On arrival, afebrile, BP 142/89 pulse 90-102, respirations 20 with O2 sat 97% on room air.  While in the ER he desaturated to 86% on room air improving to 95% on 2 L.  Blood work mostly unremarkable with normal WBC, hemoglobin.  VBG with normal pH and PCO2.  Chemistries significant only for sodium of 131.  Lactic acid 1.3 EtOH level less than 10. EKG, personally reviewed and interpreted: Sinus rhythm with incomplete RBBB, no acute ST-T wave changes Imaging: Trauma imaging significant for right mid tibial fracture and proximal fibula fracture Diagnostic x-rays of left femur left knee unremarkable CT head and C-spine with no acute findings Chest x-ray: Left basilar pneumonia worrisome for pneumonia or aspiration  Patient was evaluated by orthopedist, Dr. Rosita KeaMenz in the emergency room who recommended stabilization of medical pulmonary issues prior to surgical correction.  Patient received Rocephin, azithromycin, duo nebs and Solu-Medrol in the emergency room.  Hospitalist  consulted for admission.   Assessment and Plan:  RLL pneumonia, possible aspiration Acute respiratory failure with hypoxia (HCC) COPD with possible mild exacerbation Patient was hypoxic to the 80s with EMS and placed on O2.  Again hypoxic in the emergency room with O2 sats 86 on room air improving to 95 on 2 L and also had increased work of breathing treated with DuoNeb. VBG with normal pH and PCO2. Patient was afebrile and without leukocytosis and normal lactic acid. He reports he has been on O2 in the past, not currently. CXR with RLL basilar infiltrate concerning for possible pna, possible aspiration. Pt does have a hx of etoh abuse. CTA with multiple R lobe nodules, no PE. TTE unremarkable. Has been weaned off o2 and is breathing comfortably -stop ceftriaxone (4/2-4/4), start unasyn given concern for possible aspiration. Clinically is stable and improving. Will plan on 5-7 day course of abx - f/u TTE (ordered, result pending). Continue home lasix for now - will need f/u ct in 1-2 months  - discussed with ortho PA that respiratory status has improved and likely stable for proceeding with addressing tibia fracture  Closed displaced spiral fracture of shaft of right tibia Accidental fall Trauma imaging reveals bilateral fracture of right tibia and proximal fibula but otherwise no acute injury -Pain control with norco, prn dilaudid,  started miralax -Seen by Dr. Sherrell PullerMendez in the ER.  Note appreciated -touched base w/ ortho today about clinical improvement  Acute urinary retention -Following admission,  patient complained of not urinating and bladder scan showed greater than 999 mils -Foley for urinary retention ordered, now discontinued, is urinating - bladder scan prn and will monitor - flomax started  Alcohol binge drinking -Patient admits to drinking on weekends, Friday and Saturday, drink a 12 pack of beer each day -EtOH level less than 10 -CIWA not ordered at this time, no withdrawal  symptoms  Hyponatremia, multifactorial, nutritional deficiency vs Lasix  serum osmolality 282 within normal range. Salt tablets 2 g p.o. x2 doses given. Na wnl 4/4 and 4/5 - Check BMP daily - will discontinue lasix given normal TTE  Hypertension BP wnl -Continue metoprolol, holding lasix  Gout -Continue Colcrys   Body mass index is 29.7 kg/m.   Interventions:        Diet: Heart healthy DVT Prophylaxis: Subcutaneous Lovenox   Advance goals of care discussion: Full code  Family Communication: family was NOT present at bedside, at the time of interview.    Disposition:  Pending surgery and pt/ot eval -  Subjective:  Complains of ongoing leg pain. Has appetite, no n/v/d. No cough or fever or dyspnea. Hasn't stooled  Physical Exam: General:  alert oriented to place and person.  Appear in mild distress, affect appropriate Eyes: PERRLA ENT: Oral Mucosa Clear, moist  Neck: no JVD,  Cardiovascular: S1 and S2 Present, no Murmur,  Respiratory: good respiratory effort, Bilateral Air entry equal and Decreased, bilateral crackles at bases, no wheezes Abdomen: Bowel Sound present, Soft and no tenderness,  Skin: no rashes  Extremities: no Pedal edema, no calf tenderness, R tibia dressed Neurologic: without any new focal findings   Vitals:   06/07/20 1929 06/07/20 2325 06/08/20 0455 06/08/20 0853  BP: 100/61 114/72 99/64 106/66  Pulse: (!) 58 75 68 78  Resp: 20 17 18 16   Temp: 98 F (36.7 C) 98.5 F (36.9 C) 98.3 F (36.8 C) 98.4 F (36.9 C)  TempSrc:      SpO2: 90% 90% 94% 95%  Weight:      Height:        Intake/Output Summary (Last 24 hours) at 06/08/2020 0954 Last data filed at 06/08/2020 0615 Gross per 24 hour  Intake 340 ml  Output 700 ml  Net -360 ml   Filed Weights   06/05/20 1557 06/05/20 2245  Weight: 98.9 kg 88.6 kg    Data Reviewed: I have personally reviewed and interpreted daily labs, tele strips, imagings as discussed above. I reviewed all  nursing notes, pharmacy notes, vitals, pertinent old records I have discussed plan of care as described above with RN and patient/family.  CBC: Recent Labs  Lab 06/05/20 1805 06/05/20 2259 06/07/20 0514 06/08/20 0527  WBC 10.5 11.5* 12.4* 12.1*  NEUTROABS 8.5*  --   --   --   HGB 13.4 12.7* 10.6* 11.1*  HCT 40.0 38.8* 33.3* 34.0*  MCV 87.5 88.6 91.5 91.4  PLT 417* 389 384 389   Basic Metabolic Panel: Recent Labs  Lab 06/05/20 1805 06/05/20 2259 06/06/20 0859 06/07/20 0514 06/08/20 0527  NA 131*  --  131* 137 140  K 4.7  --  4.4 4.3 3.7  CL 94*  --  96* 101 100  CO2 25  --  26 28 29   GLUCOSE 120*  --  152* 105* 98  BUN 12  --  13 17 17   CREATININE 0.87 0.89 0.72 1.12 1.04  CALCIUM 8.4*  --  8.3* 8.7* 8.9  MG  --   --  1.9 2.0  --   PHOS  --   --  3.5 3.5  --     Studies: CT ANGIO CHEST PE W OR WO CONTRAST  Result Date: 06/07/2020 CLINICAL DATA:  Cough cough fall landing on right side. EXAM: CT ANGIOGRAPHY CHEST WITH CONTRAST TECHNIQUE: Multidetector CT imaging of the chest was performed using the standard protocol during bolus administration of intravenous contrast. Multiplanar CT image reconstructions and MIPs were obtained to evaluate the vascular anatomy. CONTRAST:  71mL OMNIPAQUE IOHEXOL 350 MG/ML SOLN COMPARISON:  None. FINDINGS: Cardiovascular: No filling defects in the pulmonary arteries to suggest pulmonary emboli. Heart is normal size. Diffuse coronary artery and aortic atherosclerosis. No aneurysm. Mediastinum/Nodes: No mediastinal, hilar, or axillary adenopathy. Trachea and esophagus are unremarkable. Thyroid unremarkable. Lungs/Pleura: Multiple rounded opacities in the posteroinferior right upper lobe and posterolateral right lower lobe. Largest rounded opacity measures up to 3.1 cm. This may reflect pneumonia but warrants follow-up given the masslike appearance. In addition. Extensive nodular airspace disease in the right upper lobe with largest nodule measuring 9  mm on image 30. This also may be infectious/inflammatory but warrants follow-up. No effusions. Left lung clear. Upper Abdomen: Imaging into the upper abdomen demonstrates no acute findings. Musculoskeletal: Chest wall soft tissues are unremarkable. No acute bony abnormality. Review of the MIP images confirms the above findings. IMPRESSION: Nodular airspace disease throughout much of the right lung with numerous small nodules in the right upper lobe and larger nodules in the inferior right upper lobe and right lower lobe. This could reflect pneumonia. Followup chest CT is recommended in 3-4 weeks following trial of antibiotic therapy to ensure resolution and exclude underlying malignancy. Extensive coronary artery disease. Aortic Atherosclerosis (ICD10-I70.0). Electronically Signed   By: Charlett Nose M.D.   On: 06/07/2020 18:27   ECHOCARDIOGRAM COMPLETE  Result Date: 06/07/2020    ECHOCARDIOGRAM REPORT   Patient Name:   Aaron Harvey Date of Exam: 06/07/2020 Medical Rec #:  [id number]     Height:       68.0 in Accession #:    [id number]    Weight:       195.3 lb Date of Birth:  1955-07-13      BSA:          2.023 m Patient Age:    64 years      BP:           112/66 mmHg Patient Gender: M             HR:           64 bpm. Exam Location:  [facility] Procedure: 2D Echo, Color Doppler and Cardiac Doppler Indications:     R01.2 Other cardiac sounds  History:         Patient has no prior history of Echocardiogram examinations.                  COPD; Risk Factors:Hypertension.  Sonographer:     Humphrey Rolls RDCS (AE) Referring Phys:  [id number] Gillis Santa Diagnosing Phys: Alwyn Pea MD  Sonographer Comments: Image acquisition challenging due to COPD, Image acquisition challenging due to respiratory motion and Image acquisition challenging due to patient body habitus. IMPRESSIONS  1. Normal LVF Normal Echo.  2. Left ventricular ejection fraction, by estimation, is 65 to 70%. The left ventricle has normal function. The left  ventricle has no regional wall motion abnormalities. Left ventricular diastolic parameters were normal.  3. Right ventricular systolic function is normal.  The right ventricular size is normal.  4. The mitral valve is normal in structure. No evidence of mitral valve regurgitation.  5. The aortic valve is normal in structure. Aortic valve regurgitation is not visualized. Conclusion(s)/Recommendation(s): Normal biventricular function without evidence of hemodynamically significant valvular heart disease. FINDINGS  Left Ventricle: Left ventricular ejection fraction, by estimation, is 65 to 70%. The left ventricle has normal function. The left ventricle has no regional wall motion abnormalities. The left ventricular internal cavity size was normal in size. There is  no left ventricular hypertrophy. Left ventricular diastolic parameters were normal. Right Ventricle: The right ventricular size is normal. No increase in right ventricular wall thickness. Right ventricular systolic function is normal. Left Atrium: Left atrial size was normal in size. Right Atrium: Right atrial size was normal in size. Pericardium: There is no evidence of pericardial effusion. Mitral Valve: The mitral valve is normal in structure. No evidence of mitral valve regurgitation. MV peak gradient, 4.6 mmHg. The mean mitral valve gradient is 1.0 mmHg. Tricuspid Valve: The tricuspid valve is normal in structure. Tricuspid valve regurgitation is trivial. Aortic Valve: The aortic valve is normal in structure. Aortic valve regurgitation is not visualized. Aortic valve mean gradient measures 3.0 mmHg. Aortic valve peak gradient measures 6.7 mmHg. Aortic valve area, by VTI measures 2.46 cm. Pulmonic Valve: The pulmonic valve was normal in structure. Pulmonic valve regurgitation is not visualized. Aorta: The ascending aorta was not well visualized. IAS/Shunts: No atrial level shunt detected by color flow Doppler. Additional Comments: Normal LVF Normal Echo.   LEFT VENTRICLE PLAX 2D LVIDd:         4.40 cm  Diastology LVIDs:         2.70 cm  LV e' medial:    7.94 cm/s LV PW:         1.10 cm  LV E/e' medial:  11.4 LV IVS:        0.90 cm  LV e' lateral:   10.20 cm/s LVOT diam:     2.00 cm  LV E/e' lateral: 8.9 LV SV:         60 LV SV Index:   30 LVOT Area:     3.14 cm  RIGHT VENTRICLE RV Basal diam:  3.00 cm LEFT ATRIUM           Index       RIGHT ATRIUM           Index LA Vol (A4C): 52.8 ml 26.10 ml/m RA Area:     15.00 cm                                   RA Volume:   33.50 ml  16.56 ml/m  AORTIC VALVE AV Area (Vmax):    2.26 cm AV Area (Vmean):   2.35 cm AV Area (VTI):     2.46 cm AV Vmax:           129.00 cm/s AV Vmean:          82.400 cm/s AV VTI:            0.243 m AV Peak Grad:      6.7 mmHg AV Mean Grad:      3.0 mmHg LVOT Vmax:         92.70 cm/s LVOT Vmean:        61.600 cm/s LVOT VTI:  0.190 m LVOT/AV VTI ratio: 0.78  AORTA Ao Root diam: 3.40 cm MITRAL VALVE MV Area (PHT): 2.60 cm    SHUNTS MV Area VTI:   1.91 cm    Systemic VTI:  0.19 m MV Peak grad:  4.6 mmHg    Systemic Diam: 2.00 cm MV Mean grad:  1.0 mmHg MV Vmax:       1.07 m/s MV Vmean:      55.2 cm/s MV Decel Time: 292 msec MV E velocity: 90.40 cm/s MV A velocity: 35.10 cm/s MV E/A ratio:  2.58 Dwayne D Callwood MD Electronically signed by Alwyn Pea MD Signature Date/Time: 06/07/2020/4:30:37 PM    Final     Scheduled Meds: . dextromethorphan-guaiFENesin  1 tablet Oral BID  . enoxaparin (LOVENOX) injection  50 mg Subcutaneous Q24H  . furosemide  40 mg Oral Daily  . guaiFENesin  600 mg Oral BID  . loratadine  10 mg Oral Daily  . metoprolol tartrate  25 mg Oral BID  . pantoprazole  40 mg Oral Daily  . PARoxetine  10 mg Oral Daily  . polyethylene glycol  17 g Oral Daily  . potassium chloride SA  20 mEq Oral Daily  . pravastatin  80 mg Oral QHS  . tamsulosin  0.4 mg Oral QPC supper   Continuous Infusions: . ampicillin-sulbactam (UNASYN) IV 3 g (06/08/20 0738)   PRN  Meds: benzonatate, HYDROcodone-acetaminophen, ipratropium-albuterol, methocarbamol  Time spent: 30 minutes  Author: Shonna Chock. MD [facility] 06/08/2020 9:54 AM  To reach On-call, see care teams to locate the attending and reach out to them via [email or web address]. If 7PM-7AM, please contact night-coverage If you still have difficulty reaching the attending provider, please page the [facility] (Director on Call) for [facility] on amion for assistance.

## 2020-06-08 NOTE — Progress Notes (Signed)
Pt states pain is 10/10. Pain is in right ankle. Dr. Karlton Lemon notified. Acknowledged. Orders received.

## 2020-06-09 ENCOUNTER — Encounter: Payer: Self-pay | Admitting: Orthopedic Surgery

## 2020-06-09 DIAGNOSIS — J9601 Acute respiratory failure with hypoxia: Secondary | ICD-10-CM

## 2020-06-09 DIAGNOSIS — S82241A Displaced spiral fracture of shaft of right tibia, initial encounter for closed fracture: Principal | ICD-10-CM

## 2020-06-09 LAB — BASIC METABOLIC PANEL
Anion gap: 11 (ref 5–15)
BUN: 13 mg/dL (ref 8–23)
CO2: 29 mmol/L (ref 22–32)
Calcium: 8.5 mg/dL — ABNORMAL LOW (ref 8.9–10.3)
Chloride: 99 mmol/L (ref 98–111)
Creatinine, Ser: 0.83 mg/dL (ref 0.61–1.24)
GFR, Estimated: >60 mL/min (ref >60)
Glucose, Bld: 101 mg/dL — ABNORMAL HIGH (ref 70–99)
Potassium: 3.6 mmol/L (ref 3.5–5.1)
Sodium: 139 mmol/L (ref 135–145)

## 2020-06-09 LAB — CBC
HCT: 32.2 % — ABNORMAL LOW (ref 39.0–52.0)
Hemoglobin: 10.3 g/dL — ABNORMAL LOW (ref 13.0–17.0)
MCH: 29.3 pg (ref 26.0–34.0)
MCHC: 32 g/dL (ref 30.0–36.0)
MCV: 91.7 fL (ref 80.0–100.0)
Platelets: 364 10*3/uL (ref 150–400)
RBC: 3.51 MIL/uL — ABNORMAL LOW (ref 4.22–5.81)
RDW: 17.4 % — ABNORMAL HIGH (ref 11.5–15.5)
WBC: 12.8 10*3/uL — ABNORMAL HIGH (ref 4.0–10.5)
nRBC: 0 % (ref 0.0–0.2)

## 2020-06-09 MED ORDER — SODIUM CHLORIDE 0.9 % IV SOLN
INTRAVENOUS | Status: DC | PRN
  Administered 2020-06-09 – 2020-06-11 (×4): 1000 mL via INTRAVENOUS

## 2020-06-09 MED ORDER — DOCUSATE SODIUM 100 MG PO CAPS
200.0000 mg | ORAL_CAPSULE | Freq: Two times a day (BID) | ORAL | Status: DC
  Administered 2020-06-09 – 2020-06-11 (×4): 200 mg via ORAL
  Filled 2020-06-09 (×5): qty 2

## 2020-06-09 NOTE — [place] FL2 (Signed)
[place] MEDICAID FL2 LEVEL OF CARE SCREENING TOOL     IDENTIFICATION  Patient Name: Aaron Harvey Birthdate: Apr 26, 1955 Sex: male Admission Date (Current Location): 06/05/2020  [place] and [place] Number:  Chiropodist and Address:  [facility], [address] [place], [place] [address]      Provider Number: [id number]  Attending Physician Name and Address:  Charise Killian, MD  Relative Name and Phone Number:  Levonne Hubert Niece [phone]    Current Level of Care: Hospital Recommended Level of Care: Skilled Nursing Facility Prior Approval Number:    Date Approved/Denied:   PASRR Number: [id number]  Discharge Plan: SNF    Current Diagnoses: Patient Active Problem List   Diagnosis Date Noted  . Alcohol consumption binge drinking 06/06/2020  . COPD (chronic obstructive pulmonary disease) (HCC) 06/05/2020  . Gout 06/05/2020  . Closed displaced spiral fracture of shaft of right tibia 06/05/2020  . Acute respiratory failure with hypoxia (HCC) 06/05/2020  . RLL pneumonia 06/05/2020  . Accidental fall 06/05/2020  . Pneumonia 06/05/2020  . Acute urinary retention 06/05/2020  . Hypertension 12/21/2009    Orientation RESPIRATION BLADDER Height & Weight     Self,Time,Situation,Place  Normal Continent Weight: 88.6 kg Height:  5\' 8"  (172.7 cm)  BEHAVIORAL SYMPTOMS/MOOD NEUROLOGICAL BOWEL NUTRITION STATUS      Continent Diet (regular diet)  AMBULATORY STATUS COMMUNICATION OF NEEDS Skin   Extensive Assist Verbally Surgical wounds                       Personal Care Assistance Level of Assistance  Dressing,Bathing Bathing Assistance: Limited assistance   Dressing Assistance: Limited assistance     Functional Limitations Info             SPECIAL CARE FACTORS FREQUENCY  PT (By licensed PT)     PT Frequency: 5 days a week              Contractures Contractures Info: Not present    Additional Factors Info   Code Status,Allergies Code Status Info: Full code Allergies Info: NKDA           Current Medications (06/09/2020):  This is the current hospital active medication list Current Facility-Administered Medications  Medication Dose Route Frequency Provider Last Rate Last Admin  . 0.9 %  sodium chloride infusion   Intravenous PRN 08/09/2020, MD 10 mL/hr at 06/09/20 0849 1,000 mL at 06/09/20 0849  . Ampicillin-Sulbactam (UNASYN) 3 g in sodium chloride 0.9 % 100 mL IVPB  3 g Intravenous Q6H 08/09/20, MD 200 mL/hr at 06/09/20 0851 3 g at 06/09/20 0851  . aspirin chewable tablet 81 mg  81 mg Oral Daily 08/09/20, MD      . benzonatate (TESSALON) capsule 100 mg  100 mg Oral TID PRN Kennedy Bucker, MD      . colchicine tablet 0.6 mg  0.6 mg Oral Daily Kennedy Bucker, MD      . dextromethorphan-guaiFENesin [facility] DM) 30-600 MG per 12 hr tablet 1 tablet  1 tablet Oral BID [facility], MD   1 tablet at 06/08/20 [phone]  . docusate sodium (COLACE) capsule 200 mg  200 mg Oral BID 8413, MD      . enoxaparin (LOVENOX) injection 50 mg  50 mg Subcutaneous Q24H Charise Killian, MD   50 mg at 06/07/20 2111  . gabapentin (NEURONTIN) capsule 300 mg  300 mg Oral TID 2112, MD      .  guaiFENesin (MUCINEX) 12 hr tablet 600 mg  600 mg Oral BID Kennedy Bucker, MD   600 mg at 06/08/20 0942  . HYDROcodone-acetaminophen (NORCO/VICODIN) 5-325 MG per tablet 1 tablet  1 tablet Oral Q4H PRN Kennedy Bucker, MD   1 tablet at 06/09/20 0258  . HYDROmorphone (DILAUDID) injection 1 mg  1 mg Intravenous Q4H PRN Kennedy Bucker, MD   1 mg at 06/09/20 0834  . ipratropium-albuterol (DUONEB) 0.5-2.5 (3) MG/3ML nebulizer solution 3 mL  3 mL Nebulization Q4H PRN Kennedy Bucker, MD      . loratadine (CLARITIN) tablet 10 mg  10 mg Oral Daily Kennedy Bucker, MD   10 mg at 06/08/20 0942  . methocarbamol (ROBAXIN) tablet 500 mg  500 mg Oral Q6H PRN Kennedy Bucker, MD   500 mg at 06/08/20 0612  . metoprolol tartrate  (LOPRESSOR) tablet 25 mg  25 mg Oral BID Kennedy Bucker, MD   25 mg at 06/08/20 0941  . pantoprazole (PROTONIX) EC tablet 40 mg  40 mg Oral Daily Kennedy Bucker, MD   40 mg at 06/08/20 0942  . PARoxetine (PAXIL) tablet 10 mg  10 mg Oral Daily Kennedy Bucker, MD   10 mg at 06/08/20 0941  . polyethylene glycol (MIRALAX / GLYCOLAX) packet 17 g  17 g Oral BID Kennedy Bucker, MD      . potassium chloride SA (KLOR-CON) CR tablet 20 mEq  20 mEq Oral Daily Kennedy Bucker, MD   20 mEq at 06/08/20 0941  . pravastatin (PRAVACHOL) tablet 80 mg  80 mg Oral QHS Kennedy Bucker, MD   80 mg at 06/07/20 2109  . tamsulosin (FLOMAX) capsule 0.4 mg  0.4 mg Oral QPC supper Kennedy Bucker, MD   0.4 mg at 06/07/20 1846     Discharge Medications: Please see discharge summary for a list of discharge medications.  Relevant Imaging Results:  Relevant Lab Results:   Additional Information SS# [id number]  Barrie Dunker, RN

## 2020-06-09 NOTE — Progress Notes (Signed)
   Subjective: 1 Day Post-Op Procedure(s) (LRB): INTRAMEDULLARY (IM) NAIL TIBIAL (Right) Patient reports pain as mild.  Overall pain improved after surgery. Patient is well, and has had no acute complaints or problems Denies any CP, SOB, ABD pain. We will continue therapy today.   Objective: Vital signs in last 24 hours: Temp:  [96.9 F (36.1 C)-99 F (37.2 C)] 98.4 F (36.9 C) (04/06 0748) Pulse Rate:  [69-83] 77 (04/06 0748) Resp:  [11-18] 16 (04/06 0748) BP: (87-138)/(60-88) 123/63 (04/06 0748) SpO2:  [88 %-100 %] 98 % (04/06 0748)  Intake/Output from previous day: 04/05 0701 - 04/06 0700 In: 1300 [I.V.:1300] Out: 900 [Urine:900] Intake/Output this shift: No intake/output data recorded.  Recent Labs    06/07/20 0514 06/08/20 0527 06/09/20 0622  HGB 10.6* 11.1* 10.3*   Recent Labs    06/08/20 0527 06/09/20 0622  WBC 12.1* 12.8*  RBC 3.72* 3.51*  HCT 34.0* 32.2*  PLT 389 364   Recent Labs    06/08/20 0527 06/09/20 0622  NA 140 139  K 3.7 3.6  CL 100 99  CO2 29 29  BUN 17 13  CREATININE 1.04 0.83  GLUCOSE 98 101*  CALCIUM 8.9 8.5*   No results for input(s): LABPT, INR in the last 72 hours.  EXAM General - Patient is Alert, Appropriate and Oriented Extremity - Neurovascular intact Sensation intact distally No cellulitis present Compartment soft Dressing - dressing C/D/I and no drainage Motor Function - intact, moving foot and toes well on exam.   Past Medical History:  Diagnosis Date  . COPD (chronic obstructive pulmonary disease) (HCC)   . Gout   . Hypertension     Assessment/Plan:   1 Day Post-Op Procedure(s) (LRB): INTRAMEDULLARY (IM) NAIL TIBIAL (Right) Principal Problem:   RLL pneumonia Active Problems:   Hypertension   COPD (chronic obstructive pulmonary disease) (HCC)   Gout   Closed displaced spiral fracture of shaft of right tibia   Acute respiratory failure with hypoxia (HCC)   Accidental fall   Pneumonia   Acute urinary  retention   Alcohol consumption binge drinking  Estimated body mass index is 29.7 kg/m as calculated from the following:   Height as of this encounter: 5\' 8"  (1.727 m).   Weight as of this encounter: 88.6 kg. Advance diet Up with therapy  Pain well controlled Vital signs stable Care manager to assist with discharge  Patient will need follow-up with [facility] orthopedics 2 weeks postop  DVT Prophylaxis - Lovenox Weight-Bearing as tolerated to right leg   [facility], PA-C [facility] 06/09/2020, 7:55 AM

## 2020-06-09 NOTE — Progress Notes (Signed)
PROGRESS NOTE    Aaron Harvey  WUJ:[id number] DOB: 08/22/1955 DOA: 06/05/2020 PCP: Armando GangLindley, Cheryl P, FNP    Assessment & Plan:   Principal Problem:   RLL pneumonia Active Problems:   Hypertension   COPD (chronic obstructive pulmonary disease) (HCC)   Gout   Closed displaced spiral fracture of shaft of right tibia   Acute respiratory failure with hypoxia (HCC)   Accidental fall   Pneumonia   Acute urinary retention   Alcohol consumption binge drinking   RLL pneumonia: possible aspiration. Continue on IV unasyn, bronchodilators and encourage incentive spirometry  Acute hypoxic respiratory failure: likely secondary to above. 80s on RA w/ EMS. Continue on supplemental oxygen and wean as tolerated  Possible COPD exacerbation: continue on bronchodilators, supplemental oxygen and encourage incentive spirometry  Closed displaced spiral fracture of right tibia: secondary to fall. S/p right tibial intramedullary nail as per ortho surg 06/08/20  Leukocytosis: likely secondary to infection. Continue on IV abxs   Acute urinary retention: continue w/ foley. Continue on flomax  Alcohol abuse: drinks on the weekends approx 12 pack of beer daily. Alcohol cessation counseling  Hyponatremia: resolved  HTN: continue on metoprolol  Gout: continue on colchicine     DVT prophylaxis: lovenox Code Status: full Family Communication:  Disposition Plan: depends on PT/OT recs   Level of care: Med-Surg   Status is: Inpatient  Remains inpatient appropriate because:Unsafe d/c plan, IV treatments appropriate due to intensity of illness or inability to take PO and Inpatient level of care appropriate due to severity of illness   Dispo: The patient is from: Home              Anticipated d/c is to: SNF              Patient currently is not medically stable to d/c.   Difficult to place patient Yes     Consultants:   Ortho surh   Procedures:    Antimicrobials: unasyn     Subjective: Pt c/o fatigue  Objective: Vitals:   06/08/20 2322 06/09/20 0126 06/09/20 0330 06/09/20 0527  BP: 118/65 121/64 133/88 119/67  Pulse: 77 74 83 71  Resp: 18 17 17 15   Temp: 97.7 F (36.5 C) 98 F (36.7 C) 99 F (37.2 C) (!) 97 F (36.1 C)  TempSrc:      SpO2: 99% 97% 98% 98%  Weight:      Height:        Intake/Output Summary (Last 24 hours) at 06/09/2020 0723 Last data filed at 06/09/2020 0216 Gross per 24 hour  Intake 1300 ml  Output 900 ml  Net 400 ml   Filed Weights   06/05/20 1557 06/05/20 2245  Weight: 98.9 kg 88.6 kg    Examination:  General exam: Appears comfortable but lethargic  Respiratory system: diminished breath sounds b/l  Cardiovascular system: S1 & S2 +. No rubs, gallops or clicks.  Gastrointestinal system: Abdomen is nondistended, soft and nontender. Normal bowel sounds heard. Central nervous system: Lethargic. Moves all extremities  Psychiatry: Judgement and insight appear normal. Flat mood and affect     Data Reviewed: I have personally reviewed following labs and imaging studies  CBC: Recent Labs  Lab 06/05/20 1805 06/05/20 2259 06/07/20 0514 06/08/20 0527 06/09/20 0622  WBC 10.5 11.5* 12.4* 12.1* 12.8*  NEUTROABS 8.5*  --   --   --   --   HGB 13.4 12.7* 10.6* 11.1* 10.3*  HCT 40.0 38.8* 33.3* 34.0* 32.2*  MCV  87.5 88.6 91.5 91.4 91.7  PLT 417* 389 384 389 364   Basic Metabolic Panel: Recent Labs  Lab 06/05/20 1805 06/05/20 2259 06/06/20 0859 06/07/20 0514 06/08/20 0527 06/09/20 0622  NA 131*  --  131* 137 140 139  K 4.7  --  4.4 4.3 3.7 3.6  CL 94*  --  96* 101 100 99  CO2 25  --  26 28 29 29   GLUCOSE 120*  --  152* 105* 98 101*  BUN 12  --  13 17 17 13   CREATININE 0.87 0.89 0.72 1.12 1.04 0.83  CALCIUM 8.4*  --  8.3* 8.7* 8.9 8.5*  MG  --   --  1.9 2.0  --   --   PHOS  --   --  3.5 3.5  --   --    GFR: Estimated Creatinine Clearance: 96 mL/min (by C-G formula based on SCr of 0.83 mg/dL). Liver  Function Tests: Recent Labs  Lab 06/05/20 1805  AST 26  ALT 15  ALKPHOS 78  BILITOT 1.2  PROT 7.1  ALBUMIN 3.0*   No results for input(s): LIPASE, AMYLASE in the last 168 hours. No results for input(s): AMMONIA in the last 168 hours. Coagulation Profile: No results for input(s): INR, PROTIME in the last 168 hours. Cardiac Enzymes: No results for input(s): CKTOTAL, CKMB, CKMBINDEX, TROPONINI in the last 168 hours. BNP (last 3 results) No results for input(s): PROBNP in the last 8760 hours. HbA1C: No results for input(s): HGBA1C in the last 72 hours. CBG: Recent Labs  Lab 06/08/20 2205 06/08/20 2238  GLUCAP 67* 104*   Lipid Profile: No results for input(s): CHOL, HDL, LDLCALC, TRIG, CHOLHDL, LDLDIRECT in the last 72 hours. Thyroid Function Tests: No results for input(s): TSH, T4TOTAL, FREET4, T3FREE, THYROIDAB in the last 72 hours. Anemia Panel: No results for input(s): VITAMINB12, FOLATE, FERRITIN, TIBC, IRON, RETICCTPCT in the last 72 hours. Sepsis Labs: Recent Labs  Lab 06/05/20 1804 06/05/20 2259  LATICACIDVEN 1.3 1.7    Recent Results (from the past 240 hour(s))  Blood culture (routine x 2)     Status: None (Preliminary result)   Collection Time: 06/05/20  6:04 PM   Specimen: BLOOD  Result Value Ref Range Status   Specimen Description BLOOD BLOOD RIGHT FOREARM  Final   Special Requests   Final    BOTTLES DRAWN AEROBIC AND ANAEROBIC Blood Culture results may not be optimal due to an excessive volume of blood received in culture bottles   Culture   Final    NO GROWTH 4 DAYS Performed at [facility], [address] [place], [address] [place]    Report Status PENDING  Incomplete  Blood culture (routine x 2)     Status: None (Preliminary result)   Collection Time: 06/05/20  6:09 PM   Specimen: BLOOD  Result Value Ref Range Status   Specimen Description BLOOD LEFT ANTECUBITAL  Final   Special Requests   Final    BOTTLES DRAWN AEROBIC AND ANAEROBIC  Blood Culture results may not be optimal due to an excessive volume of blood received in culture bottles   Culture   Final    NO GROWTH 4 DAYS Performed at [facility], [address] [place], [address] [place]    Report Status PENDING  Incomplete  Resp Panel by RT-PCR (Flu A&B, Covid) Nasopharyngeal Swab     Status: None   Collection Time: 06/05/20  6:37 PM   Specimen: Nasopharyngeal Swab; Nasopharyngeal(NP)  swabs in vial transport medium  Result Value Ref Range Status   SARS Coronavirus 2 by RT PCR NEGATIVE NEGATIVE Final    Comment: (NOTE) SARS-CoV-2 target nucleic acids are NOT DETECTED.  The SARS-CoV-2 RNA is generally detectable in upper respiratory specimens during the acute phase of infection. The lowest concentration of SARS-CoV-2 viral copies this assay can detect is 138 copies/mL. A negative result does not preclude SARS-Cov-2 infection and should not be used as the sole basis for treatment or other patient management decisions. A negative result may occur with  improper specimen collection/handling, submission of specimen other than nasopharyngeal swab, presence of viral mutation(s) within the areas targeted by this assay, and inadequate number of viral copies(<138 copies/mL). A negative result must be combined with clinical observations, patient history, and epidemiological information. The expected result is Negative.  Fact Sheet for Patients:  BloggerCourse.com  Fact Sheet for Healthcare Providers:  SeriousBroker.it  This test is no t yet approved or cleared by the [place] FDA and  has been authorized for detection and/or diagnosis of SARS-CoV-2 by FDA under an Emergency Use Authorization (EUA). This EUA will remain  in effect (meaning this test can be used) for the duration of the COVID-19 declaration under Section 564(b)(1) of the Act, 21 U.S.C.section 360bbb-3(b)(1), unless the authorization is  terminated  or revoked sooner.       Influenza A by PCR NEGATIVE NEGATIVE Final   Influenza B by PCR NEGATIVE NEGATIVE Final    Comment: (NOTE) The Xpert Xpress SARS-CoV-2/FLU/RSV plus assay is intended as an aid in the diagnosis of influenza from Nasopharyngeal swab specimens and should not be used as a sole basis for treatment. Nasal washings and aspirates are unacceptable for Xpert Xpress SARS-CoV-2/FLU/RSV testing.  Fact Sheet for Patients: BloggerCourse.com  Fact Sheet for Healthcare Providers: SeriousBroker.it  This test is not yet approved or cleared by the [place] FDA and has been authorized for detection and/or diagnosis of SARS-CoV-2 by FDA under an Emergency Use Authorization (EUA). This EUA will remain in effect (meaning this test can be used) for the duration of the COVID-19 declaration under Section 564(b)(1) of the Act, 21 U.S.C. section 360bbb-3(b)(1), unless the authorization is terminated or revoked.  Performed at [facility], [address], [place], [place] [address]          Radiology Studies: DG Tibia/Fibula Right  Result Date: 06/08/2020 CLINICAL DATA:  IM nail to the right tibia EXAM: RIGHT TIBIA AND FIBULA - 2 VIEW; DG C-ARM 1-60 MIN COMPARISON:  06/05/2020 FINDINGS: Intraoperative fluoroscopy is obtained for surgical control purposes. Fluoroscopy time is recorded at 1 minute 20 seconds. Exposure dose parameters are not provided. Two spot fluoroscopic images are obtained. Spot fluoroscopic images demonstrate intramedullary rod in the tibia with single proximal and 2 distal locking screws. The midshaft is not included within the field of view and the fracture seen previously is not visualized on these views. IMPRESSION: Intraoperative fluoroscopy obtained for surgical control purposes demonstrating intramedullary rod fixation of the tibia. Electronically Signed   By: Burman Nieves M.D.    On: 06/08/2020 21:59   CT ANGIO CHEST PE W OR WO CONTRAST  Result Date: 06/07/2020 CLINICAL DATA:  Cough cough fall landing on right side. EXAM: CT ANGIOGRAPHY CHEST WITH CONTRAST TECHNIQUE: Multidetector CT imaging of the chest was performed using the standard protocol during bolus administration of intravenous contrast. Multiplanar CT image reconstructions and MIPs were obtained to evaluate the vascular anatomy. CONTRAST:  75mL OMNIPAQUE  IOHEXOL 350 MG/ML SOLN COMPARISON:  None. FINDINGS: Cardiovascular: No filling defects in the pulmonary arteries to suggest pulmonary emboli. Heart is normal size. Diffuse coronary artery and aortic atherosclerosis. No aneurysm. Mediastinum/Nodes: No mediastinal, hilar, or axillary adenopathy. Trachea and esophagus are unremarkable. Thyroid unremarkable. Lungs/Pleura: Multiple rounded opacities in the posteroinferior right upper lobe and posterolateral right lower lobe. Largest rounded opacity measures up to 3.1 cm. This may reflect pneumonia but warrants follow-up given the masslike appearance. In addition. Extensive nodular airspace disease in the right upper lobe with largest nodule measuring 9 mm on image 30. This also may be infectious/inflammatory but warrants follow-up. No effusions. Left lung clear. Upper Abdomen: Imaging into the upper abdomen demonstrates no acute findings. Musculoskeletal: Chest wall soft tissues are unremarkable. No acute bony abnormality. Review of the MIP images confirms the above findings. IMPRESSION: Nodular airspace disease throughout much of the right lung with numerous small nodules in the right upper lobe and larger nodules in the inferior right upper lobe and right lower lobe. This could reflect pneumonia. Followup chest CT is recommended in 3-4 weeks following trial of antibiotic therapy to ensure resolution and exclude underlying malignancy. Extensive coronary artery disease. Aortic Atherosclerosis (ICD10-I70.0). Electronically Signed    By: Charlett Nose M.D.   On: 06/07/2020 18:27   DG Tibia/Fibula Right Port  Result Date: 06/08/2020 CLINICAL DATA:  Post ORIF right tib fib EXAM: PORTABLE RIGHT TIBIA AND FIBULA - 2 VIEW COMPARISON:  06/05/2020 FINDINGS: Postoperative changes with intramedullary rod fixation of an oblique fracture of the midshaft right tibia. A single proximal and 2 distal locking screws are present. Near-anatomic alignment is demonstrated in the fracture fragments with minimal residual displacement. Oblique comminuted fracture of the proximal right fibula is again demonstrated. Minimal posterior and medial displacement of the distal fracture fragment resulting in slight cortical step-off. Skin clips are consistent with recent surgery. IMPRESSION: Postoperative intramedullary rod fixation of fractures of the midshaft right tibia. Electronically Signed   By: Burman Nieves M.D.   On: 06/08/2020 23:08   DG C-Arm 1-60 Min  Result Date: 06/08/2020 CLINICAL DATA:  IM nail to the right tibia EXAM: RIGHT TIBIA AND FIBULA - 2 VIEW; DG C-ARM 1-60 MIN COMPARISON:  06/05/2020 FINDINGS: Intraoperative fluoroscopy is obtained for surgical control purposes. Fluoroscopy time is recorded at 1 minute 20 seconds. Exposure dose parameters are not provided. Two spot fluoroscopic images are obtained. Spot fluoroscopic images demonstrate intramedullary rod in the tibia with single proximal and 2 distal locking screws. The midshaft is not included within the field of view and the fracture seen previously is not visualized on these views. IMPRESSION: Intraoperative fluoroscopy obtained for surgical control purposes demonstrating intramedullary rod fixation of the tibia. Electronically Signed   By: Burman Nieves M.D.   On: 06/08/2020 21:59   ECHOCARDIOGRAM COMPLETE  Result Date: 06/07/2020    ECHOCARDIOGRAM REPORT   Patient Name:   KIANTE CIAVARELLA Harvey Date of Exam: 06/07/2020 Medical Rec #:  [id number]     Height:       68.0 in Accession #:     [id number]    Weight:       195.3 lb Date of Birth:  [birth year]      BSA:          2.023 m Patient Age:    64 years      BP:           112/66 mmHg Patient Gender: M  HR:           64 bpm. Exam Location:  [facility] Procedure: 2D Echo, Color Doppler and Cardiac Doppler Indications:     R01.2 Other cardiac sounds  History:         Patient has no prior history of Echocardiogram examinations.                  COPD; Risk Factors:Hypertension.  Sonographer:     Humphrey Rolls RDCS (AE) Referring Phys:  [id number] Gillis Santa Diagnosing Phys: Alwyn Pea MD  Sonographer Comments: Image acquisition challenging due to COPD, Image acquisition challenging due to respiratory motion and Image acquisition challenging due to patient body habitus. IMPRESSIONS  1. Normal LVF Normal Echo.  2. Left ventricular ejection fraction, by estimation, is 65 to 70%. The left ventricle has normal function. The left ventricle has no regional wall motion abnormalities. Left ventricular diastolic parameters were normal.  3. Right ventricular systolic function is normal. The right ventricular size is normal.  4. The mitral valve is normal in structure. No evidence of mitral valve regurgitation.  5. The aortic valve is normal in structure. Aortic valve regurgitation is not visualized. Conclusion(s)/Recommendation(s): Normal biventricular function without evidence of hemodynamically significant valvular heart disease. FINDINGS  Left Ventricle: Left ventricular ejection fraction, by estimation, is 65 to 70%. The left ventricle has normal function. The left ventricle has no regional wall motion abnormalities. The left ventricular internal cavity size was normal in size. There is  no left ventricular hypertrophy. Left ventricular diastolic parameters were normal. Right Ventricle: The right ventricular size is normal. No increase in right ventricular wall thickness. Right ventricular systolic function is normal. Left Atrium: Left atrial size was  normal in size. Right Atrium: Right atrial size was normal in size. Pericardium: There is no evidence of pericardial effusion. Mitral Valve: The mitral valve is normal in structure. No evidence of mitral valve regurgitation. MV peak gradient, 4.6 mmHg. The mean mitral valve gradient is 1.0 mmHg. Tricuspid Valve: The tricuspid valve is normal in structure. Tricuspid valve regurgitation is trivial. Aortic Valve: The aortic valve is normal in structure. Aortic valve regurgitation is not visualized. Aortic valve mean gradient measures 3.0 mmHg. Aortic valve peak gradient measures 6.7 mmHg. Aortic valve area, by VTI measures 2.46 cm. Pulmonic Valve: The pulmonic valve was normal in structure. Pulmonic valve regurgitation is not visualized. Aorta: The ascending aorta was not well visualized. IAS/Shunts: No atrial level shunt detected by color flow Doppler. Additional Comments: Normal LVF Normal Echo.  LEFT VENTRICLE PLAX 2D LVIDd:         4.40 cm  Diastology LVIDs:         2.70 cm  LV e' medial:    7.94 cm/s LV PW:         1.10 cm  LV E/e' medial:  11.4 LV IVS:        0.90 cm  LV e' lateral:   10.20 cm/s LVOT diam:     2.00 cm  LV E/e' lateral: 8.9 LV SV:         60 LV SV Index:   30 LVOT Area:     3.14 cm  RIGHT VENTRICLE RV Basal diam:  3.00 cm LEFT ATRIUM           Index       RIGHT ATRIUM           Index LA Vol (A4C): 52.8 ml 26.10 ml/m RA Area:  15.00 cm                                   RA Volume:   33.50 ml  16.56 ml/m  AORTIC VALVE AV Area (Vmax):    2.26 cm AV Area (Vmean):   2.35 cm AV Area (VTI):     2.46 cm AV Vmax:           129.00 cm/s AV Vmean:          82.400 cm/s AV VTI:            0.243 m AV Peak Grad:      6.7 mmHg AV Mean Grad:      3.0 mmHg LVOT Vmax:         92.70 cm/s LVOT Vmean:        61.600 cm/s LVOT VTI:          0.190 m LVOT/AV VTI ratio: 0.78  AORTA Ao Root diam: 3.40 cm MITRAL VALVE MV Area (PHT): 2.60 cm    SHUNTS MV Area VTI:   1.91 cm    Systemic VTI:  0.19 m MV Peak grad:   4.6 mmHg    Systemic Diam: 2.00 cm MV Mean grad:  1.0 mmHg MV Vmax:       1.07 m/s MV Vmean:      55.2 cm/s MV Decel Time: 292 msec MV E velocity: 90.40 cm/s MV A velocity: 35.10 cm/s MV E/A ratio:  2.58 Dwayne D Callwood MD Electronically signed by Alwyn Pea MD Signature Date/Time: 06/07/2020/4:30:37 PM    Final         Scheduled Meds: . aspirin  81 mg Oral Daily  . colchicine  0.6 mg Oral Daily  . dextromethorphan-guaiFENesin  1 tablet Oral BID  . enoxaparin (LOVENOX) injection  50 mg Subcutaneous Q24H  . gabapentin  300 mg Oral TID  . guaiFENesin  600 mg Oral BID  . loratadine  10 mg Oral Daily  . metoprolol tartrate  25 mg Oral BID  . pantoprazole  40 mg Oral Daily  . PARoxetine  10 mg Oral Daily  . polyethylene glycol  17 g Oral BID  . potassium chloride SA  20 mEq Oral Daily  . pravastatin  80 mg Oral QHS  . tamsulosin  0.4 mg Oral QPC supper   Continuous Infusions: . ampicillin-sulbactam (UNASYN) IV 3 g (06/09/20 0215)     LOS: 4 days    Time spent: 31 mins     Charise Killian, MD [facility] Pager [phone]  If 7PM-7AM, please contact night-coverage 06/09/2020, 7:23 AM

## 2020-06-09 NOTE — Anesthesia Postprocedure Evaluation (Signed)
Anesthesia Post Note  Patient: Aaron Harvey  Procedure(s) Performed: INTRAMEDULLARY (IM) NAIL TIBIAL (Right )  Patient location during evaluation: Nursing Unit Anesthesia Type: Spinal Level of consciousness: oriented and awake and alert Pain management: pain level controlled Vital Signs Assessment: post-procedure vital signs reviewed and stable Respiratory status: spontaneous breathing, respiratory function stable and patient connected to nasal cannula oxygen Cardiovascular status: blood pressure returned to baseline and stable Postop Assessment: no headache, no backache, no apparent nausea or vomiting and patient able to bend at knees Anesthetic complications: no   No complications documented.   Last Vitals:  Vitals:   06/09/20 0527 06/09/20 0748  BP: 119/67 123/63  Pulse: 71 77  Resp: 15 16  Temp: (!) 36.1 C 36.9 C  SpO2: 98% 98%    Last Pain:  Vitals:   06/09/20 0834  TempSrc:   PainSc: 10-Worst pain ever                 Karoline Caldwell

## 2020-06-09 NOTE — Progress Notes (Signed)
Patient arrived back to the floor from the OR. RN notified family. Aaron Harvey, niece, requested to be the point person of contact for pt medical information. RN informed, niece, to instruct all family  to contact her with any questions regarding pt care since she is the point person. Niece verbalized understanding. No other concerns.

## 2020-06-10 DIAGNOSIS — F1010 Alcohol abuse, uncomplicated: Secondary | ICD-10-CM

## 2020-06-10 LAB — CBC
HCT: 31.1 % — ABNORMAL LOW (ref 39.0–52.0)
Hemoglobin: 9.7 g/dL — ABNORMAL LOW (ref 13.0–17.0)
MCH: 29.1 pg (ref 26.0–34.0)
MCHC: 31.2 g/dL (ref 30.0–36.0)
MCV: 93.4 fL (ref 80.0–100.0)
Platelets: 338 10*3/uL (ref 150–400)
RBC: 3.33 MIL/uL — ABNORMAL LOW (ref 4.22–5.81)
RDW: 17.2 % — ABNORMAL HIGH (ref 11.5–15.5)
WBC: 10.3 10*3/uL (ref 4.0–10.5)
nRBC: 0 % (ref 0.0–0.2)

## 2020-06-10 LAB — CULTURE, BLOOD (ROUTINE X 2)
Culture: NO GROWTH
Culture: NO GROWTH

## 2020-06-10 LAB — BASIC METABOLIC PANEL
Anion gap: 10 (ref 5–15)
BUN: 10 mg/dL (ref 8–23)
CO2: 29 mmol/L (ref 22–32)
Calcium: 8.4 mg/dL — ABNORMAL LOW (ref 8.9–10.3)
Chloride: 102 mmol/L (ref 98–111)
Creatinine, Ser: 0.8 mg/dL (ref 0.61–1.24)
GFR, Estimated: >60 mL/min (ref >60)
Glucose, Bld: 91 mg/dL (ref 70–99)
Potassium: 3.7 mmol/L (ref 3.5–5.1)
Sodium: 141 mmol/L (ref 135–145)

## 2020-06-10 MED ORDER — ASPIRIN EC 325 MG PO TBEC: 325.0000 mg | DELAYED_RELEASE_TABLET | Freq: Every day | ORAL | 0 refills | Status: DC

## 2020-06-10 MED ORDER — HYDROCODONE-ACETAMINOPHEN 5-325 MG PO TABS: 1.0000 | ORAL_TABLET | ORAL | 0 refills | Status: DC | PRN

## 2020-06-10 NOTE — Evaluation (Addendum)
Physical Therapy Evaluation Patient Details Name: Aaron Harvey MRN: [id number] DOB: Jul 07, 1955 Today's Date: 06/10/2020   History of Present Illness  Patient is a 65 yo male s/p fall at home, s/p R tibial IM nail, per PT order WBAT.  Also found to have RLL PNA, acute respiratory failure, potential COPD exacerbation, acute urinary retention. PMH of COPD, gout, HTN, previous back surgery, etoh.    Clinical Impression  Patient in bed, reported no pain at rest but did reference abdominal pain (L sided) with mobility. Pt reported at baseline he does not ambulate much; tends to spend most of his time in bed and will stand pivot to [facility] for bathroom needs. Sponge baths at baseline without assist, nephew and niece performs IADLs.   The patient was unable to fully straighten either knee, pt seems to have bilateral hamstring contractures. Supine to sit with CGA and use of bed rails, effortful for pt this AM. Sit <> stand with RW and maxA attempted, unable to come up into fully standing. Lateral scoot with modA to chair, pt instructed in step by step cueing. Pt left in chair with all needs in reach. Overall the patient demonstrated deficits in function and independence compared to baseline, and would benefit from further skilled PT intervention to return to PLOF.     Follow Up Recommendations SNF    Equipment Recommendations  Other (comment) (TBD at next venue of care)    Recommendations for Other Services OT consult     Precautions / Restrictions Precautions Precautions: Fall Restrictions Weight Bearing Restrictions: Yes RLE Weight Bearing: Weight bearing as tolerated      Mobility  Bed Mobility Overal bed mobility: Needs Assistance Bed Mobility: Supine to Sit     Supine to sit: Min guard;HOB elevated     General bed mobility comments: use of bed rails    Transfers Overall transfer level: Needs assistance Equipment used: Rolling walker (2 wheeled) Transfers: Sit to/from  Stand;Lateral/Scoot Transfers Sit to Stand: Max assist;From elevated surface        Lateral/Scoot Transfers: Mod assist    Ambulation/Gait             General Gait Details: unable at this time  Stairs            Wheelchair Mobility    Modified Rankin (Stroke Patients Only)       Balance Overall balance assessment: Needs assistance Sitting-balance support: Feet supported Sitting balance-Leahy Scale: Fair       Standing balance-Leahy Scale: Zero                               Pertinent Vitals/Pain Pain Assessment: Faces Faces Pain Scale: Hurts even more Pain Location: L abdominal pain Pain Descriptors / Indicators: Aching;Sore Pain Intervention(s): Limited activity within patient's tolerance;Premedicated before session;Repositioned    Home Living Family/patient expects to be discharged to:: Private residence Living Arrangements: Other relatives Available Help at Discharge: Family Type of Home: Mobile home Home Access: Ramped entrance     Home Layout: One level Home Equipment: Environmental consultant - 2 wheels;Walker - 4 wheels;Bedside commode;Hospital bed      Prior Function Level of Independence: Needs assistance   Gait / Transfers Assistance Needed: pt stated he is able to stand and pivot to [facility] at home without assistance  ADL's / Homemaking Assistance Needed: sponge baths modI, nephew/neice in law perform IADLs  Comments: no other falls     Hand Dominance  Extremity/Trunk Assessment   Upper Extremity Assessment Upper Extremity Assessment: Overall WFL for tasks assessed    Lower Extremity Assessment Lower Extremity Assessment: Generalized weakness (pt appears to have bilateral hamstring contractures, unable to fully straighten his knees)    Cervical / Trunk Assessment Cervical / Trunk Assessment: Kyphotic  Communication   Communication: No difficulties  Cognition Arousal/Alertness: Awake/alert Behavior During Therapy: WFL for  tasks assessed/performed Overall Cognitive Status: Within Functional Limits for tasks assessed                                        General Comments      Exercises     Assessment/Plan    PT Assessment Patient needs continued PT services  PT Problem List Decreased strength;Decreased range of motion;Decreased activity tolerance;Decreased balance;Decreased mobility;Pain;Decreased knowledge of precautions       PT Treatment Interventions DME instruction;Balance training;Gait training;Neuromuscular re-education;Stair training;Functional mobility training;Patient/family education;Therapeutic activities;Therapeutic exercise    PT Goals (Current goals can be found in the Care Plan section)  Acute Rehab PT Goals Patient Stated Goal: to get stronger PT Goal Formulation: With patient Time For Goal Achievement: 06/24/20 Potential to Achieve Goals: Good    Frequency BID   Barriers to discharge        Co-evaluation               AM-PAC PT "6 Clicks" Mobility  Outcome Measure Help needed turning from your back to your side while in a flat bed without using bedrails?: A Little Help needed moving from lying on your back to sitting on the side of a flat bed without using bedrails?: A Little Help needed moving to and from a bed to a chair (including a wheelchair)?: A Lot Help needed standing up from a chair using your arms (e.g., wheelchair or bedside chair)?: Total Help needed to walk in hospital room?: Total Help needed climbing 3-5 steps with a railing? : Total 6 Click Score: 11    End of Session Equipment Utilized During Treatment: Gait belt Activity Tolerance: Patient tolerated treatment well Patient left: in chair;with call bell/phone within reach;with chair alarm set Nurse Communication: Mobility status PT Visit Diagnosis: Other abnormalities of gait and mobility (R26.89);Muscle weakness (generalized) (M62.81);Difficulty in walking, not elsewhere  classified (R26.2);Pain Pain - Right/Left: Right Pain - part of body: Knee    Time: 1700-1749 PT Time Calculation (min) (ACUTE ONLY): 22 min   Charges:   PT Evaluation $PT Eval Low Complexity: 1 Low PT Treatments $Therapeutic Exercise: 8-22 mins       Olga Coaster PT, DPT 12:39 PM,06/10/20

## 2020-06-10 NOTE — Care Management Important Message (Signed)
Important Message  Patient Details  Name: Aaron Harvey MRN: [id number] Date of Birth: 1955-07-29   Medicare Important Message Given:  Yes  Patient was asleep and no family in the patients room.  Left a copy of bedside to review.   Olegario Messier A Delene Morais 06/10/2020, 3:11 PM

## 2020-06-10 NOTE — TOC Progression Note (Signed)
Transition of Care [facility]) - Progression Note    Patient Details  Name: Aaron Harvey MRN: [id number] Date of Birth: 09/10/1955  [facility]) CM/SW Contact  Barrie Dunker, RN Phone Number: 06/10/2020, 1:26 PM  Clinical Narrative:   Spoke with the patient's niece Aaron Harvey, she stated that she and her husband is the only family the patient has, I explained that I did a bed search since PT was able to work with the patient, at this time we are waiting on bed offers, Once those are obtained, I will call her back and review bed offers so that they can make a choice, she asked me if the plan was for long term, I explained that the patient will start out as Short term for rehab however if he does end up needing Long term the facility can help transition into that, she stated that the ultimate goal is for short term rehab ,          Expected Discharge Plan and Services                                                 Social Determinants of Health (SDOH) Interventions    Readmission Risk Interventions No flowsheet data found.

## 2020-06-10 NOTE — Progress Notes (Signed)
Physical Therapy Treatment Patient Details Name: Aaron Harvey MRN: [id number] DOB: 1955-07-19 Today's Date: 06/10/2020    History of Present Illness Patient is a 65 yo male s/p fall at home, s/p R tibial IM nail, per PT order WBAT.  Also found to have RLL PNA, acute respiratory failure, potential COPD exacerbation, acute urinary retention. PMH of COPD, gout, HTN, previous back surgery, etoh.    PT Comments    Session modified due to pt pain and fatigue. Focused on exercises, pt with limited TKE on either LE, but with great effort and positioning able to achieve nearly 0degrees extension with SAQ momentarily. Patient unable to attempt on RLE due to pain. Pt in bed at end of session, all needs in reach. The patient would benefit from further skilled PT intervention to continue to progress towards goals. Recommendation remains appropriate.     Follow Up Recommendations  SNF     Equipment Recommendations  Other (comment) (TBD at next venue of care)    Recommendations for Other Services OT consult     Precautions / Restrictions Precautions Precautions: Fall Restrictions Weight Bearing Restrictions: Yes RLE Weight Bearing: Weight bearing as tolerated    Mobility  Bed Mobility Overal bed mobility: Needs Assistance Bed Mobility: Rolling Rolling: Min guard   Supine to sit: Min guard;HOB elevated     General bed mobility comments: deferred due to elevated pain    Transfers Overall transfer level: Needs assistance Equipment used: Rolling walker (2 wheeled) Transfers: Sit to/from Stand;Lateral/Scoot Transfers Sit to Stand: Max assist;From elevated surface        Lateral/Scoot Transfers: Mod assist    Ambulation/Gait             General Gait Details: unable at this time   Stairs             Wheelchair Mobility    Modified Rankin (Stroke Patients Only)       Balance Overall balance assessment: Needs assistance Sitting-balance support: Feet  supported Sitting balance-Leahy Scale: Fair       Standing balance-Leahy Scale: Zero                              Cognition Arousal/Alertness: Awake/alert Behavior During Therapy: WFL for tasks assessed/performed Overall Cognitive Status: Within Functional Limits for tasks assessed                                        Exercises Other Exercises Other Exercises: hip abduction x10 against PT manual resistance, hip adduction against pillow x10, SAQ on LLE x10. elbow flexion/extension against PT resistance x10. Repositioned with knees as straight as possible, pt educated about importance of positioning    General Comments        Pertinent Vitals/Pain Pain Assessment: Faces Faces Pain Scale: Hurts whole lot Pain Location: bilateral leg pain Pain Descriptors / Indicators: Aching;Sore Pain Intervention(s): Limited activity within patient's tolerance;Premedicated before session;Repositioned    Home Living Family/patient expects to be discharged to:: Private residence Living Arrangements: Other relatives Available Help at Discharge: Family Type of Home: Mobile home Home Access: Ramped entrance   Home Layout: One level Home Equipment: Environmental consultant - 2 wheels;Walker - 4 wheels;Bedside commode;Hospital bed      Prior Function Level of Independence: Needs assistance  Gait / Transfers Assistance Needed: pt stated he is able to stand and  pivot to [facility] at home without assistance ADL's / Homemaking Assistance Needed: sponge baths modI, nephew/neice in law perform IADLs Comments: no other falls   PT Goals (current goals can now be found in the care plan section) Acute Rehab PT Goals Patient Stated Goal: to get stronger PT Goal Formulation: With patient Time For Goal Achievement: 06/24/20 Potential to Achieve Goals: Good Progress towards PT goals: Progressing toward goals    Frequency    Min 2X/week      PT Plan Current plan remains appropriate     Co-evaluation              AM-PAC PT "6 Clicks" Mobility   Outcome Measure  Help needed turning from your back to your side while in a flat bed without using bedrails?: A Little Help needed moving from lying on your back to sitting on the side of a flat bed without using bedrails?: A Little Help needed moving to and from a bed to a chair (including a wheelchair)?: A Lot Help needed standing up from a chair using your arms (e.g., wheelchair or bedside chair)?: Total Help needed to walk in hospital room?: Total Help needed climbing 3-5 steps with a railing? : Total 6 Click Score: 11    End of Session Equipment Utilized During Treatment: Gait belt Activity Tolerance: Patient tolerated treatment well Patient left: in bed;with bed alarm set;with call bell/phone within reach Nurse Communication: Mobility status PT Visit Diagnosis: Other abnormalities of gait and mobility (R26.89);Muscle weakness (generalized) (M62.81);Difficulty in walking, not elsewhere classified (R26.2);Pain Pain - Right/Left: Right Pain - part of body: Knee     Time: 0076-2263 PT Time Calculation (min) (ACUTE ONLY): 12 min  Charges:  $Therapeutic Exercise: 8-22 mins                     Olga Coaster PT, DPT 3:20 PM,06/10/20

## 2020-06-10 NOTE — Plan of Care (Signed)

## 2020-06-10 NOTE — Progress Notes (Signed)
PROGRESS NOTE     HPI was taken from Dr. Para March: Aaron Harvey is a 65 y.o. male with medical history significant for COPD, gout, HTN, presenting to the ED via EMS following what appears to be an accidental fall onto his right side, in which he tripped and fell as he was trying to go to the bathroom.  He did not hit his head and did not lose consciousness.  Patient was previously in his usual state of health.  Denied preceding lightheadedness, chest pain shortness of breath or palpitations.  Denied recent fever or chills.  Has a cough.  Denied nausea, vomiting abdominal pain, dysuria or change in bowel habits.  Has chronic pain for which she is on chronic narcotics.  On arrival of EMS he was placed on 2 L O2 due to low O2 sat on room air. ED course: On arrival, afebrile, BP 142/89 pulse 90-102, respirations 20 with O2 sat 97% on room air.  While in the ER he desaturated to 86% on room air improving to 95% on 2 L.  Blood work mostly unremarkable with normal WBC, hemoglobin.  VBG with normal pH and PCO2.  Chemistries significant only for sodium of 131.  Lactic acid 1.3 EtOH level less than 10. EKG, personally reviewed and interpreted: Sinus rhythm with incomplete RBBB, no acute ST-T wave changes Imaging: Trauma imaging significant for right mid tibial fracture and proximal fibula fracture Diagnostic x-rays of left femur left knee unremarkable CT head and C-spine with no acute findings Chest x-ray: Left basilar pneumonia worrisome for pneumonia or aspiration  Patient was evaluated by orthopedist, Dr. Rosita Kea in the emergency room who recommended stabilization of medical pulmonary issues prior to surgical correction.  Patient received Rocephin, azithromycin, duo nebs and Solu-Medrol in the emergency room.  Hospitalist consulted for admission.  Hospital course from Dr. Mayford Knife 4/6-06/10/20: Pt is being treated for pneumonia and possible COPD exacerbation w/ IV unasyn, bronchodilators, incentive spirometry  and supplemental oxygen. Pt has since been weaned off of supplemental oxygen. Of note, pt was found to have a closed displaced spiral fracture of right tibia and is s/p right tibial intramedullary nail as per ortho surg. PT evaluated the pt and recs SNF. CM is working on SNF placement. For more information, please see previous progress/consult notes.   Aaron Harvey  UVO:[id number] DOB: [birth year] DOA: 06/05/2020 PCP: Armando Gang, FNP    Assessment & Plan:   Principal Problem:   RLL pneumonia Active Problems:   Hypertension   COPD (chronic obstructive pulmonary disease) (HCC)   Gout   Closed displaced spiral fracture of shaft of right tibia   Acute respiratory failure with hypoxia (HCC)   Accidental fall   Pneumonia   Acute urinary retention   Alcohol consumption binge drinking   RLL pneumonia: possible aspiration. Continue on IV unasyn, bronchodilators and encourage incentive spirometry  Acute hypoxic respiratory failure: likely secondary to above. 80s on RA w/ EMS. Weaned off of supplemental oxygen  Possible COPD exacerbation: continue on bronchodilators and encourage incentive spirometry   Closed displaced spiral fracture of right tibia: secondary to fall. S/p right tibial intramedullary nail as per ortho surg 06/08/20  Leukocytosis: resolved  Acute urinary retention: continue w/ foley and w/ flomax   Alcohol abuse: drinks on the weekends approx 12 pack of beer daily. Alcohol cessation counseling  Hyponatremia: resolved  HTN: continue on BB   Gout: continue on colchicine       DVT prophylaxis: lovenox Code Status: full  Family Communication:  Disposition Plan: likely d/c to SNF   Level of care: Med-Surg   Status is: Inpatient  Remains inpatient appropriate because:Unsafe d/c plan, IV treatments appropriate due to intensity of illness or inability to take PO and Inpatient level of care appropriate due to severity of illness, CM is working on SNF placement     Dispo: The patient is from: Home              Anticipated d/c is to: SNF              Patient currently is not medically stable to d/c.   Difficult to place patient Yes     Consultants:   Ortho surh   Procedures:    Antimicrobials: unasyn    Subjective: Pt c/o headache   Objective: Vitals:   06/09/20 1607 06/09/20 2043 06/10/20 0036 06/10/20 0320  BP: 93/63 131/76 (!) 153/124 125/70  Pulse: 72 74 79 70  Resp: 18 18 16    Temp: 99.2 F (37.3 C) 99.1 F (37.3 C) 98.2 F (36.8 C) 99.6 F (37.6 C)  TempSrc: Oral Oral  Oral  SpO2: 96% 94% 91% 94%  Weight:      Height:        Intake/Output Summary (Last 24 hours) at 06/10/2020 0718 Last data filed at 06/09/2020 1638 Gross per 24 hour  Intake 28.21 ml  Output 700 ml  Net -671.79 ml   Filed Weights   06/05/20 1557 06/05/20 2245  Weight: 98.9 kg 88.6 kg    Examination:  General exam: Appears calm & comfortable  Respiratory system: decreased breath sounds b/l  Cardiovascular system: S1/S2+. No rubs or clicks  Gastrointestinal system: Abd is soft, NT, obese & hypoactive bowel sounds  Central nervous system: Awake and oriented. Moves all extremities  Psychiatry: Judgement and insight appear normal. Flat mood and affect    Data Reviewed: I have personally reviewed following labs and imaging studies  CBC: Recent Labs  Lab 06/05/20 1805 06/05/20 2259 06/07/20 0514 06/08/20 0527 06/09/20 0622 06/10/20 0425  WBC 10.5 11.5* 12.4* 12.1* 12.8* 10.3  NEUTROABS 8.5*  --   --   --   --   --   HGB 13.4 12.7* 10.6* 11.1* 10.3* 9.7*  HCT 40.0 38.8* 33.3* 34.0* 32.2* 31.1*  MCV 87.5 88.6 91.5 91.4 91.7 93.4  PLT 417* 389 384 389 364 338   Basic Metabolic Panel: Recent Labs  Lab 06/06/20 0859 06/07/20 0514 06/08/20 0527 06/09/20 0622 06/10/20 0425  NA 131* 137 140 139 141  K 4.4 4.3 3.7 3.6 3.7  CL 96* 101 100 99 102  CO2 26 28 29 29 29   GLUCOSE 152* 105* 98 101* 91  BUN 13 17 17 13 10   CREATININE  0.72 1.12 1.04 0.83 0.80  CALCIUM 8.3* 8.7* 8.9 8.5* 8.4*  MG 1.9 2.0  --   --   --   PHOS 3.5 3.5  --   --   --    GFR: Estimated Creatinine Clearance: 99.6 mL/min (by C-G formula based on SCr of 0.8 mg/dL). Liver Function Tests: Recent Labs  Lab 06/05/20 1805  AST 26  ALT 15  ALKPHOS 78  BILITOT 1.2  PROT 7.1  ALBUMIN 3.0*   No results for input(s): LIPASE, AMYLASE in the last 168 hours. No results for input(s): AMMONIA in the last 168 hours. Coagulation Profile: No results for input(s): INR, PROTIME in the last 168 hours. Cardiac Enzymes: No results for input(s): CKTOTAL, CKMB,  CKMBINDEX, TROPONINI in the last 168 hours. BNP (last 3 results) No results for input(s): PROBNP in the last 8760 hours. HbA1C: No results for input(s): HGBA1C in the last 72 hours. CBG: Recent Labs  Lab 06/08/20 2205 06/08/20 2238  GLUCAP 67* 104*   Lipid Profile: No results for input(s): CHOL, HDL, LDLCALC, TRIG, CHOLHDL, LDLDIRECT in the last 72 hours. Thyroid Function Tests: No results for input(s): TSH, T4TOTAL, FREET4, T3FREE, THYROIDAB in the last 72 hours. Anemia Panel: No results for input(s): VITAMINB12, FOLATE, FERRITIN, TIBC, IRON, RETICCTPCT in the last 72 hours. Sepsis Labs: Recent Labs  Lab 06/05/20 1804 06/05/20 2259  LATICACIDVEN 1.3 1.7    Recent Results (from the past 240 hour(s))  Blood culture (routine x 2)     Status: None   Collection Time: 06/05/20  6:04 PM   Specimen: BLOOD  Result Value Ref Range Status   Specimen Description BLOOD BLOOD RIGHT FOREARM  Final   Special Requests   Final    BOTTLES DRAWN AEROBIC AND ANAEROBIC Blood Culture results may not be optimal due to an excessive volume of blood received in culture bottles   Culture   Final    NO GROWTH 5 DAYS Performed at [facility], [address] [place], [place] [address]    Report Status 06/10/2020 FINAL  Final  Blood culture (routine x 2)     Status: None   Collection Time:  06/05/20  6:09 PM   Specimen: BLOOD  Result Value Ref Range Status   Specimen Description BLOOD LEFT ANTECUBITAL  Final   Special Requests   Final    BOTTLES DRAWN AEROBIC AND ANAEROBIC Blood Culture results may not be optimal due to an excessive volume of blood received in culture bottles   Culture   Final    NO GROWTH 5 DAYS Performed at [facility], [address] [place], [place] [address]    Report Status 06/10/2020 FINAL  Final  Resp Panel by RT-PCR (Flu A&B, Covid) Nasopharyngeal Swab     Status: None   Collection Time: 06/05/20  6:37 PM   Specimen: Nasopharyngeal Swab; Nasopharyngeal(NP) swabs in vial transport medium  Result Value Ref Range Status   SARS Coronavirus 2 by RT PCR NEGATIVE NEGATIVE Final    Comment: (NOTE) SARS-CoV-2 target nucleic acids are NOT DETECTED.  The SARS-CoV-2 RNA is generally detectable in upper respiratory specimens during the acute phase of infection. The lowest concentration of SARS-CoV-2 viral copies this assay can detect is 138 copies/mL. A negative result does not preclude SARS-Cov-2 infection and should not be used as the sole basis for treatment or other patient management decisions. A negative result may occur with  improper specimen collection/handling, submission of specimen other than nasopharyngeal swab, presence of viral mutation(s) within the areas targeted by this assay, and inadequate number of viral copies(<138 copies/mL). A negative result must be combined with clinical observations, patient history, and epidemiological information. The expected result is Negative.  Fact Sheet for Patients:  BloggerCourse.comhttps://www.fda.gov/media/152166/download  Fact Sheet for Healthcare Providers:  SeriousBroker.ithttps://www.fda.gov/media/152162/download  This test is no t yet approved or cleared by the [place] FDA and  has been authorized for detection and/or diagnosis of SARS-CoV-2 by FDA under an Emergency Use Authorization (EUA). This EUA will  remain  in effect (meaning this test can be used) for the duration of the COVID-19 declaration under Section 564(b)(1) of the Act, 21 U.S.C.section 360bbb-3(b)(1), unless the authorization is terminated  or revoked sooner.  Influenza A by PCR NEGATIVE NEGATIVE Final   Influenza B by PCR NEGATIVE NEGATIVE Final    Comment: (NOTE) The Xpert Xpress SARS-CoV-2/FLU/RSV plus assay is intended as an aid in the diagnosis of influenza from Nasopharyngeal swab specimens and should not be used as a sole basis for treatment. Nasal washings and aspirates are unacceptable for Xpert Xpress SARS-CoV-2/FLU/RSV testing.  Fact Sheet for Patients: BloggerCourse.com  Fact Sheet for Healthcare Providers: SeriousBroker.it  This test is not yet approved or cleared by the [place] FDA and has been authorized for detection and/or diagnosis of SARS-CoV-2 by FDA under an Emergency Use Authorization (EUA). This EUA will remain in effect (meaning this test can be used) for the duration of the COVID-19 declaration under Section 564(b)(1) of the Act, 21 U.S.C. section 360bbb-3(b)(1), unless the authorization is terminated or revoked.  Performed at [facility], [address], [place], [place] [address]          Radiology Studies: DG Tibia/Fibula Right  Result Date: 06/08/2020 CLINICAL DATA:  IM nail to the right tibia EXAM: RIGHT TIBIA AND FIBULA - 2 VIEW; DG C-ARM 1-60 MIN COMPARISON:  06/05/2020 FINDINGS: Intraoperative fluoroscopy is obtained for surgical control purposes. Fluoroscopy time is recorded at 1 minute 20 seconds. Exposure dose parameters are not provided. Two spot fluoroscopic images are obtained. Spot fluoroscopic images demonstrate intramedullary rod in the tibia with single proximal and 2 distal locking screws. The midshaft is not included within the field of view and the fracture seen previously is not visualized on  these views. IMPRESSION: Intraoperative fluoroscopy obtained for surgical control purposes demonstrating intramedullary rod fixation of the tibia. Electronically Signed   By: Burman Nieves M.D.   On: 06/08/2020 21:59   DG Tibia/Fibula Right Port  Result Date: 06/08/2020 CLINICAL DATA:  Post ORIF right tib fib EXAM: PORTABLE RIGHT TIBIA AND FIBULA - 2 VIEW COMPARISON:  06/05/2020 FINDINGS: Postoperative changes with intramedullary rod fixation of an oblique fracture of the midshaft right tibia. A single proximal and 2 distal locking screws are present. Near-anatomic alignment is demonstrated in the fracture fragments with minimal residual displacement. Oblique comminuted fracture of the proximal right fibula is again demonstrated. Minimal posterior and medial displacement of the distal fracture fragment resulting in slight cortical step-off. Skin clips are consistent with recent surgery. IMPRESSION: Postoperative intramedullary rod fixation of fractures of the midshaft right tibia. Electronically Signed   By: Burman Nieves M.D.   On: 06/08/2020 23:08   DG C-Arm 1-60 Min  Result Date: 06/08/2020 CLINICAL DATA:  IM nail to the right tibia EXAM: RIGHT TIBIA AND FIBULA - 2 VIEW; DG C-ARM 1-60 MIN COMPARISON:  06/05/2020 FINDINGS: Intraoperative fluoroscopy is obtained for surgical control purposes. Fluoroscopy time is recorded at 1 minute 20 seconds. Exposure dose parameters are not provided. Two spot fluoroscopic images are obtained. Spot fluoroscopic images demonstrate intramedullary rod in the tibia with single proximal and 2 distal locking screws. The midshaft is not included within the field of view and the fracture seen previously is not visualized on these views. IMPRESSION: Intraoperative fluoroscopy obtained for surgical control purposes demonstrating intramedullary rod fixation of the tibia. Electronically Signed   By: Burman Nieves M.D.   On: 06/08/2020 21:59        Scheduled Meds: .  aspirin  81 mg Oral Daily  . colchicine  0.6 mg Oral Daily  . dextromethorphan-guaiFENesin  1 tablet Oral BID  . docusate sodium  200 mg Oral BID  . enoxaparin (  LOVENOX) injection  50 mg Subcutaneous Q24H  . gabapentin  300 mg Oral TID  . guaiFENesin  600 mg Oral BID  . loratadine  10 mg Oral Daily  . metoprolol tartrate  25 mg Oral BID  . pantoprazole  40 mg Oral Daily  . PARoxetine  10 mg Oral Daily  . polyethylene glycol  17 g Oral BID  . potassium chloride SA  20 mEq Oral Daily  . pravastatin  80 mg Oral QHS  . tamsulosin  0.4 mg Oral QPC supper   Continuous Infusions: . sodium chloride 1,000 mL (06/09/20 1401)  . ampicillin-sulbactam (UNASYN) IV 3 g (06/10/20 0133)     LOS: 5 days    Time spent: 30 mins     Charise Killian, MD [facility] Pager [phone]  If 7PM-7AM, please contact night-coverage 06/10/2020, 7:18 AM

## 2020-06-10 NOTE — Evaluation (Signed)
Occupational Therapy Evaluation Patient Details Name: Aaron Harvey MRN: [id number] DOB: 07/22/1955 Today's Date: 06/10/2020    History of Present Illness Patient is a 65 yo male s/p fall at home, s/p R tibial IM nail, per PT order WBAT.  Also found to have RLL PNA, acute respiratory failure, potential COPD exacerbation, acute urinary retention. PMH of COPD, gout, HTN, previous back surgery, etoh.   Clinical Impression   Pt seen for OT evaluation this date in setting of acute hospitalization d/t fall, now s/p R tibial IM nailing and found to have PNA. Pt reports that he is MOD I with bathing and transfers at baseline and endorses not moving much in past 2-3 months d/t being sad that his youngest daughter passed away. Pt presents with gross weakness and decreased fxl activity tolerance as well as R LE pain impacting his ability to perform ADLs/ADL mobility safely and efficiently. On assessment, pt requires SETUP for seated UB ADLs as he is unble to mobilize to retrieve needed items, requires MOD A for LB ADLs at this time 2/2 pain. Pt requires MIN A for bed mobility and declines to stand with OT at this time citing pain. Pt will require continued skilled OT services in acute setting as well as f/u in STR upon d/c to maximize outcomes to support returning to MOD I functioning in his home environment. Will continue to follow.    Follow Up Recommendations  SNF    Equipment Recommendations  3 in 1 bedside commode;Tub/shower seat;Other (comment) (2ww)    Recommendations for Other Services       Precautions / Restrictions Precautions Precautions: Fall Restrictions Weight Bearing Restrictions: Yes RLE Weight Bearing: Weight bearing as tolerated      Mobility Bed Mobility Overal bed mobility: Needs Assistance Bed Mobility: Supine to Sit;Sit to Supine Rolling: Min guard   Supine to sit: Min guard;HOB elevated Sit to supine: Min assist;Mod assist   General bed mobility comments: increased  time and assist for LEs d/t pain    Transfers                 General transfer comment: deferred d/t pain and fatigue    Balance Overall balance assessment: Needs assistance Sitting-balance support: Feet supported Sitting balance-Leahy Scale: Fair                                     ADL either performed or assessed with clinical judgement   ADL Overall ADL's : Needs assistance/impaired                                       General ADL Comments: requires SETUP for seated UB ADLs as he is unble to mobilize to retrieve needed items, requires MOD A for LB ADLs at this time 2/2 pain.     Vision Patient Visual Report: No change from baseline       Perception     Praxis      Pertinent Vitals/Pain Pain Assessment: Faces Faces Pain Scale: Hurts even more Pain Location: bilateral leg pain R>L Pain Descriptors / Indicators: Aching;Sore Pain Intervention(s): Limited activity within patient's tolerance;Monitored during session     Hand Dominance     Extremity/Trunk Assessment Upper Extremity Assessment Upper Extremity Assessment: RUE deficits/detail;LUE deficits/detail RUE Deficits / Details: shld ROM to 1/2 range, h/o  inj. Grip MMT grossly 4-/5 LUE Deficits / Details: ROM WFL, MMT grossly 4/5   Lower Extremity Assessment Lower Extremity Assessment: Defer to PT evaluation;Generalized weakness;RLE deficits/detail RLE: Unable to fully assess due to pain   Cervical / Trunk Assessment Cervical / Trunk Assessment: Kyphotic   Communication Communication Communication: No difficulties   Cognition Arousal/Alertness: Awake/alert Behavior During Therapy: WFL for tasks assessed/performed Overall Cognitive Status: Within Functional Limits for tasks assessed                                 General Comments: pt is oriented to place, situation, and self but not time including month/year. He is appropriate wtih all command following  and conversationally.   General Comments       Exercises Other Exercises Other Exercises: OT facilitates ed re: role of OT, potential rehabilitation needs after hospital stay, use of AE for LB ADLs, strengthening for UB, importance of OOB activity, WBAT to R LE. Pt with moderate reception, will require f/u.   Shoulder Instructions      Home Living Family/patient expects to be discharged to:: Private residence Living Arrangements: Other relatives Available Help at Discharge: Family Type of Home: Mobile home Home Access: Ramped entrance     Home Layout: One level               Home Equipment: Environmental consultant - 2 wheels;Walker - 4 wheels;Bedside commode;Hospital bed          Prior Functioning/Environment Level of Independence: Needs assistance  Gait / Transfers Assistance Needed: pt stated he is able to stand and pivot to [facility] at home without assistance ADL's / Homemaking Assistance Needed: sponge baths modI, nephew/neice in law perform IADLs   Comments: no other falls        OT Problem List: Decreased strength;Decreased range of motion;Decreased activity tolerance;Impaired balance (sitting and/or standing);Decreased knowledge of use of DME or AE;Decreased knowledge of precautions;Cardiopulmonary status limiting activity;Obesity;Pain;Increased edema      OT Treatment/Interventions: Self-care/ADL training;DME and/or AE instruction;Therapeutic activities;Balance training;Therapeutic exercise;Energy conservation;Patient/family education    OT Goals(Current goals can be found in the care plan section) Acute Rehab OT Goals Patient Stated Goal: to get stronger OT Goal Formulation: With patient Time For Goal Achievement: 06/24/20 Potential to Achieve Goals: Good ADL Goals Pt Will Perform Lower Body Dressing: with min assist;sit to/from stand (with LRAD/AE PRN) Pt Will Transfer to Toilet: with min assist;ambulating;bedside commode (with LRAD to BSC ~10' away) Pt Will Perform  Toileting - Clothing Manipulation and hygiene: with min assist;sit to/from stand Pt/caregiver will Perform Home Exercise Program: Increased strength;Both right and left upper extremity;With Supervision (mindful of R shld issues at baseline)  OT Frequency: Min 2X/week   Barriers to D/C: Inaccessible home environment;Decreased caregiver support  nephew and his spouse that pt live with are both on disability.       Co-evaluation              AM-PAC OT "6 Clicks" Daily Activity     Outcome Measure Help from another person eating meals?: None Help from another person taking care of personal grooming?: A Little Help from another person toileting, which includes using toliet, bedpan, or urinal?: A Lot Help from another person bathing (including washing, rinsing, drying)?: A Lot Help from another person to put on and taking off regular upper body clothing?: A Little Help from another person to put on and taking off regular lower body  clothing?: A Lot 6 Click Score: 16   End of Session Nurse Communication: Mobility status  Activity Tolerance: Patient tolerated treatment well Patient left: Other (comment) (seated EOB eating dinner, bed alarm set, RN okay'ed)  OT Visit Diagnosis: Unsteadiness on feet (R26.81);Muscle weakness (generalized) (M62.81);History of falling (Z91.81);Pain Pain - Right/Left: Right Pain - part of body: Leg                Time: 4098-1191 OT Time Calculation (min): 33 min Charges:  OT General Charges $OT Visit: 1 Visit OT Evaluation $OT Eval Moderate Complexity: 1 Mod OT Treatments $Self Care/Home Management : 8-22 mins $Therapeutic Activity: 8-22 mins  Rejeana Brock, MS, OTR/L ascom [phone] 06/10/20, 6:16 PM

## 2020-06-10 NOTE — Progress Notes (Addendum)
   Subjective: 2 Days Post-Op Procedure(s) (LRB): INTRAMEDULLARY (IM) NAIL TIBIAL (Right) Patient reports pain as mild.  Overall pain improved after surgery. Patient is well, and has had no acute complaints or problems Denies any CP, SOB, ABD pain. We will continue therapy today.   Objective: Vital signs in last 24 hours: Temp:  [97.6 F (36.4 C)-99.6 F (37.6 C)] 99.6 F (37.6 C) (04/07 0320) Pulse Rate:  [67-79] 70 (04/07 0320) Resp:  [16-19] 16 (04/07 0036) BP: (93-153)/(63-124) 125/70 (04/07 0320) SpO2:  [91 %-98 %] 94 % (04/07 0320)  Intake/Output from previous day: 04/06 0701 - 04/07 0700 In: 28.2 [I.V.:28.2] Out: 700 [Urine:700] Intake/Output this shift: No intake/output data recorded.  Recent Labs    06/08/20 0527 06/09/20 0622 06/10/20 0425  HGB 11.1* 10.3* 9.7*   Recent Labs    06/09/20 0622 06/10/20 0425  WBC 12.8* 10.3  RBC 3.51* 3.33*  HCT 32.2* 31.1*  PLT 364 338   Recent Labs    06/09/20 0622 06/10/20 0425  NA 139 141  K 3.6 3.7  CL 99 102  CO2 29 29  BUN 13 10  CREATININE 0.83 0.80  GLUCOSE 101* 91  CALCIUM 8.5* 8.4*   No results for input(s): LABPT, INR in the last 72 hours.  EXAM General - Patient is Alert, Appropriate and Oriented Extremity - Neurovascular intact Sensation intact distally No cellulitis present Compartment soft Dressing - dressing C/D/I and no drainage Motor Function - intact, moving foot and toes well on exam.   Past Medical History:  Diagnosis Date  . COPD (chronic obstructive pulmonary disease) (HCC)   . Gout   . Hypertension     Assessment/Plan:   2 Days Post-Op Procedure(s) (LRB): INTRAMEDULLARY (IM) NAIL TIBIAL (Right) Principal Problem:   RLL pneumonia Active Problems:   Hypertension   COPD (chronic obstructive pulmonary disease) (HCC)   Gout   Closed displaced spiral fracture of shaft of right tibia   Acute respiratory failure with hypoxia (HCC)   Accidental fall   Pneumonia   Acute  urinary retention   Alcohol consumption binge drinking  Estimated body mass index is 29.7 kg/m as calculated from the following:   Height as of this encounter: 5\' 8"  (1.727 m).   Weight as of this encounter: 88.6 kg. Advance diet Up with therapy  Pain well controlled Vital signs stable Care manager to assist with discharge to SNF  Patient will need follow-up with [facility] orthopedics 2 weeks postop Norco Rx in chart Aspirin for DVT prophylaxis at discharge  DVT Prophylaxis - Lovenox Weight-Bearing as tolerated to right leg   [facility], PA-C [facility] 06/10/2020, 7:33 AM

## 2020-06-10 NOTE — Progress Notes (Signed)
PT Cancellation Note  Patient Details Name: Aaron Harvey MRN: [id number] DOB: Jul 19, 1955   Cancelled Treatment:    Reason Eval/Treat Not Completed: Other (comment). Pt reported elevated abdominal pain (R sided this AM) RN in room, and recent administration of pain medication. Pt still restless in bed due to this pain and reluctant to work with PT at the time referencing recent bowel movement, abdominal pain and reported a migraine last night as well. PT to re-attempt as able.    Olga Coaster PT, DPT 9:50 AM,06/10/20

## 2020-06-11 DIAGNOSIS — G8911 Acute pain due to trauma: Secondary | ICD-10-CM | POA: Diagnosis not present

## 2020-06-11 DIAGNOSIS — S82241K Displaced spiral fracture of shaft of right tibia, subsequent encounter for closed fracture with nonunion: Secondary | ICD-10-CM

## 2020-06-11 DIAGNOSIS — J449 Chronic obstructive pulmonary disease, unspecified: Secondary | ICD-10-CM | POA: Diagnosis not present

## 2020-06-11 DIAGNOSIS — E871 Hypo-osmolality and hyponatremia: Secondary | ICD-10-CM | POA: Diagnosis not present

## 2020-06-11 DIAGNOSIS — M2550 Pain in unspecified joint: Secondary | ICD-10-CM | POA: Diagnosis not present

## 2020-06-11 DIAGNOSIS — W19XXXD Unspecified fall, subsequent encounter: Secondary | ICD-10-CM

## 2020-06-11 DIAGNOSIS — R339 Retention of urine, unspecified: Secondary | ICD-10-CM | POA: Diagnosis not present

## 2020-06-11 DIAGNOSIS — Z8781 Personal history of (healed) traumatic fracture: Secondary | ICD-10-CM | POA: Diagnosis not present

## 2020-06-11 DIAGNOSIS — K219 Gastro-esophageal reflux disease without esophagitis: Secondary | ICD-10-CM | POA: Diagnosis not present

## 2020-06-11 DIAGNOSIS — R338 Other retention of urine: Secondary | ICD-10-CM

## 2020-06-11 DIAGNOSIS — R5381 Other malaise: Secondary | ICD-10-CM | POA: Diagnosis not present

## 2020-06-11 DIAGNOSIS — R2681 Unsteadiness on feet: Secondary | ICD-10-CM | POA: Diagnosis not present

## 2020-06-11 DIAGNOSIS — I10 Essential (primary) hypertension: Secondary | ICD-10-CM | POA: Diagnosis not present

## 2020-06-11 DIAGNOSIS — M62838 Other muscle spasm: Secondary | ICD-10-CM | POA: Diagnosis not present

## 2020-06-11 DIAGNOSIS — J309 Allergic rhinitis, unspecified: Secondary | ICD-10-CM | POA: Diagnosis not present

## 2020-06-11 DIAGNOSIS — M84461D Pathological fracture, right tibia, subsequent encounter for fracture with routine healing: Secondary | ICD-10-CM | POA: Diagnosis not present

## 2020-06-11 DIAGNOSIS — J189 Pneumonia, unspecified organism: Secondary | ICD-10-CM | POA: Diagnosis not present

## 2020-06-11 DIAGNOSIS — M109 Gout, unspecified: Secondary | ICD-10-CM | POA: Diagnosis not present

## 2020-06-11 DIAGNOSIS — S82201D Unspecified fracture of shaft of right tibia, subsequent encounter for closed fracture with routine healing: Secondary | ICD-10-CM | POA: Diagnosis not present

## 2020-06-11 DIAGNOSIS — W19XXXA Unspecified fall, initial encounter: Secondary | ICD-10-CM | POA: Diagnosis not present

## 2020-06-11 DIAGNOSIS — Z9889 Other specified postprocedural states: Secondary | ICD-10-CM | POA: Diagnosis not present

## 2020-06-11 DIAGNOSIS — Z7401 Bed confinement status: Secondary | ICD-10-CM | POA: Diagnosis not present

## 2020-06-11 DIAGNOSIS — E785 Hyperlipidemia, unspecified: Secondary | ICD-10-CM | POA: Diagnosis not present

## 2020-06-11 DIAGNOSIS — D513 Other dietary vitamin B12 deficiency anemia: Secondary | ICD-10-CM | POA: Diagnosis not present

## 2020-06-11 DIAGNOSIS — I2510 Atherosclerotic heart disease of native coronary artery without angina pectoris: Secondary | ICD-10-CM | POA: Diagnosis not present

## 2020-06-11 DIAGNOSIS — G894 Chronic pain syndrome: Secondary | ICD-10-CM | POA: Diagnosis not present

## 2020-06-11 DIAGNOSIS — J9601 Acute respiratory failure with hypoxia: Secondary | ICD-10-CM | POA: Diagnosis not present

## 2020-06-11 DIAGNOSIS — M24569 Contracture, unspecified knee: Secondary | ICD-10-CM | POA: Diagnosis not present

## 2020-06-11 DIAGNOSIS — D508 Other iron deficiency anemias: Secondary | ICD-10-CM | POA: Diagnosis not present

## 2020-06-11 DIAGNOSIS — Z4789 Encounter for other orthopedic aftercare: Secondary | ICD-10-CM | POA: Diagnosis not present

## 2020-06-11 DIAGNOSIS — M6281 Muscle weakness (generalized): Secondary | ICD-10-CM | POA: Diagnosis not present

## 2020-06-11 DIAGNOSIS — E876 Hypokalemia: Secondary | ICD-10-CM | POA: Diagnosis not present

## 2020-06-11 DIAGNOSIS — D75839 Thrombocytosis, unspecified: Secondary | ICD-10-CM | POA: Diagnosis not present

## 2020-06-11 DIAGNOSIS — Z743 Need for continuous supervision: Secondary | ICD-10-CM | POA: Diagnosis not present

## 2020-06-11 DIAGNOSIS — J441 Chronic obstructive pulmonary disease with (acute) exacerbation: Secondary | ICD-10-CM | POA: Diagnosis not present

## 2020-06-11 LAB — CBC
HCT: 30 % — ABNORMAL LOW (ref 39.0–52.0)
Hemoglobin: 9.3 g/dL — ABNORMAL LOW (ref 13.0–17.0)
MCH: 28.9 pg (ref 26.0–34.0)
MCHC: 31 g/dL (ref 30.0–36.0)
MCV: 93.2 fL (ref 80.0–100.0)
Platelets: 332 10*3/uL (ref 150–400)
RBC: 3.22 MIL/uL — ABNORMAL LOW (ref 4.22–5.81)
RDW: 17.4 % — ABNORMAL HIGH (ref 11.5–15.5)
WBC: 9.5 10*3/uL (ref 4.0–10.5)
nRBC: 0 % (ref 0.0–0.2)

## 2020-06-11 LAB — RESP PANEL BY RT-PCR (FLU A&B, COVID) ARPGX2
Influenza A by PCR: NEGATIVE
Influenza B by PCR: NEGATIVE
SARS Coronavirus 2 by RT PCR: NEGATIVE

## 2020-06-11 LAB — BASIC METABOLIC PANEL
Anion gap: 10 (ref 5–15)
BUN: 10 mg/dL (ref 8–23)
CO2: 27 mmol/L (ref 22–32)
Calcium: 8.3 mg/dL — ABNORMAL LOW (ref 8.9–10.3)
Chloride: 104 mmol/L (ref 98–111)
Creatinine, Ser: 0.87 mg/dL (ref 0.61–1.24)
GFR, Estimated: >60 mL/min (ref >60)
Glucose, Bld: 96 mg/dL (ref 70–99)
Potassium: 3.6 mmol/L (ref 3.5–5.1)
Sodium: 141 mmol/L (ref 135–145)

## 2020-06-11 MED ORDER — ENOXAPARIN SODIUM 40 MG/0.4ML ~~LOC~~ SOLN: 40.0000 mg | SUBCUTANEOUS | Status: DC

## 2020-06-11 MED ORDER — GABAPENTIN 300 MG PO CAPS: 300.0000 mg | ORAL_CAPSULE | Freq: Three times a day (TID) | ORAL | Status: DC

## 2020-06-11 MED ORDER — AMOXICILLIN-POT CLAVULANATE 875-125 MG PO TABS: 1.0000 | ORAL_TABLET | Freq: Two times a day (BID) | ORAL | 0 refills | Status: AC

## 2020-06-11 MED ORDER — AMOXICILLIN-POT CLAVULANATE 875-125 MG PO TABS: 1.0000 | ORAL_TABLET | Freq: Two times a day (BID) | ORAL | 0 refills | Status: DC

## 2020-06-11 MED ORDER — DOCUSATE SODIUM 100 MG PO CAPS
200.0000 mg | ORAL_CAPSULE | Freq: Two times a day (BID) | ORAL | 0 refills | Status: DC
Start: 2020-06-11 — End: 2020-08-31

## 2020-06-11 MED ORDER — TAMSULOSIN HCL 0.4 MG PO CAPS: 0.4000 mg | ORAL_CAPSULE | Freq: Every day | ORAL | Status: DC

## 2020-06-11 MED ORDER — METHOCARBAMOL 500 MG PO TABS: 500.0000 mg | ORAL_TABLET | Freq: Four times a day (QID) | ORAL | Status: DC | PRN

## 2020-06-11 MED ORDER — LORATADINE 10 MG PO TABS
10.0000 mg | ORAL_TABLET | Freq: Every day | ORAL | Status: AC
Start: 2020-06-12 — End: ?

## 2020-06-11 MED ORDER — GUAIFENESIN ER 600 MG PO TB12: 600.0000 mg | ORAL_TABLET | Freq: Two times a day (BID) | ORAL | Status: DC

## 2020-06-11 MED ORDER — BENZONATATE 100 MG PO CAPS: 100.0000 mg | ORAL_CAPSULE | Freq: Three times a day (TID) | ORAL | 0 refills | Status: DC | PRN

## 2020-06-11 MED ORDER — POLYETHYLENE GLYCOL 3350 17 G PO PACK: 17.0000 g | PACK | Freq: Two times a day (BID) | ORAL | 0 refills | Status: DC

## 2020-06-11 NOTE — Discharge Summary (Addendum)
Aaron Harvey:[id number] DOB: 1955-04-29 DOA: 06/05/2020  PCP: Armando Gang, FNP  Admit date: 06/05/2020 Discharge date: 06/11/2020  Admitted From:home Disposition:  SNF  Recommendations for Outpatient Follow-up:  1. Follow up with PCP in 1 week 2. Please obtain BMP/CBC in one week 3.need follow-up with [facility] orthopedics 2 weeks postop 4.  Aspirin 325 mg for DVT prophylaxis at discharge   Discharge Condition:Stable CODE STATUS: Full Diet recommendation: Heart Healthy  Brief/Interim Summary: Per Aaron Harvey is a 65 y.o. male with medical history significant for COPD, gout, HTN, presenting to the ED via EMS following what appears to be an accidental fall onto his right side, in which he tripped and fell as he was trying to go to the bathroom.  He did not hit his head and did not lose consciousness.  Patient was previously in his usual state of health.sustaining right tibial fracture with work-up also revealing hypoxia with RLL pneumonia on chest x-ray.   Patient was admitted.  Orthopedics was consulted.  Patient was started on IV antibiotics.  RLL pneumonia: possible aspiration.  Started on IV Unasyn.   Will need 3 more days of Augmentin to complete course   Continue incentive spirometer.     Acute hypoxic respiratory failure: likely secondary to above. 80s on RA w/ EMS. Weaned off of supplemental oxygen Currently satting 93 to 97% on room air  Possible COPD exacerbation:  Improved.  No wheezing on exam.   Continue inhalers and incentive spirometer      Closed displaced spiral fracture of right tibia: secondary to fall. S/p right tibial intramedullary nail as per ortho surg 06/08/20 PT OT recommended SNF. Patient will need follow-up with [facility] orthopedics 2 weeks postop Aspirin for DVT prophylaxis at discharge Weight-Bearing as tolerated to right leg  Leukocytosis: resolved  Acute urinary retention:  Foley removed.  Continue Flomax    Alcohol abuse: drinks on the  weekends approx 12 pack of beer daily. Alcohol cessation counseling  Hyponatremia: resolved  HTN: continue on BB   Gout: continue on colchicine     Discharge Diagnoses:  Principal Problem:   RLL pneumonia Active Problems:   Hypertension   COPD (chronic obstructive pulmonary disease) (HCC)   Gout   Closed displaced spiral fracture of shaft of right tibia   Acute respiratory failure with hypoxia (HCC)   Accidental fall   Pneumonia   Acute urinary retention   Alcohol consumption binge drinking    Discharge Instructions  Discharge Instructions    Diet - low sodium heart healthy   Complete by: As directed    Discharge instructions   Complete by: As directed    need follow-up with [facility] orthopedics 2 weeks postop*   If the dressing is still on your incision site when you go home, remove it on the third day after your surgery date. Remove dressing if it begins to fall off, or if it is dirty or damaged before the third day.   Complete by: As directed    Increase activity slowly   Complete by: As directed      Allergies as of 06/11/2020   No Known Allergies     Medication List    STOP taking these medications   aspirin 81 MG chewable tablet Replaced by: aspirin EC 325 MG tablet   Colcrys 0.6 MG tablet Generic drug: colchicine   furosemide 40 MG tablet Commonly known as: LASIX     TAKE these medications   albuterol 108 (90 Base)  MCG/ACT inhaler Commonly known as: VENTOLIN HFA Take 2 puffs by mouth every 4 (four) hours as needed. Reported on 09/15/2015   amoxicillin-clavulanate 875-125 MG tablet Commonly known as: Augmentin Take 1 tablet by mouth every 12 (twelve) hours for 3 days.   aspirin EC 325 MG tablet Take 1 tablet (325 mg total) by mouth daily. Replaces: aspirin 81 MG chewable tablet   benzonatate 100 MG capsule Commonly known as: TESSALON Take 1 capsule (100 mg total) by mouth 3 (three) times daily as needed for cough.   docusate sodium 100 MG  capsule Commonly known as: COLACE Take 2 capsules (200 mg total) by mouth 2 (two) times daily.   gabapentin 300 MG capsule Commonly known as: NEURONTIN Take 1 capsule (300 mg total) by mouth 3 (three) times daily. What changed:   how much to take  additional instructions   guaiFENesin 600 MG 12 hr tablet Commonly known as: MUCINEX Take 1 tablet (600 mg total) by mouth 2 (two) times daily.   HYDROcodone-acetaminophen 5-325 MG tablet Commonly known as: NORCO/VICODIN Take 1 tablet by mouth every 4 (four) hours as needed. What changed: reasons to take this   loratadine 10 MG tablet Commonly known as: CLARITIN Take 1 tablet (10 mg total) by mouth daily. Start taking on: June 12, 2020   methocarbamol 500 MG tablet Commonly known as: ROBAXIN Take 1 tablet (500 mg total) by mouth every 6 (six) hours as needed for muscle spasms.   metoprolol tartrate 25 MG tablet Commonly known as: LOPRESSOR Take 25 mg by mouth 2 (two) times daily. Reported on 09/15/2015   omeprazole 40 MG capsule Commonly known as: PRILOSEC Take 40 mg by mouth daily.   PARoxetine 10 MG tablet Commonly known as: PAXIL Take 10 mg by mouth daily.   polyethylene glycol 17 g packet Commonly known as: MIRALAX / GLYCOLAX Take 17 g by mouth 2 (two) times daily.   potassium chloride SA 20 MEQ tablet Commonly known as: KLOR-CON Take 20 mEq by mouth daily.   pravastatin 20 MG tablet Commonly known as: PRAVACHOL Take 80 mg by mouth daily. Reported on 09/15/2015   tamsulosin 0.4 MG Caps capsule Commonly known as: FLOMAX Take 1 capsule (0.4 mg total) by mouth daily after supper.   Trelegy Ellipta 100-62.5-25 MCG/INH Aepb Generic drug: Fluticasone-Umeclidin-Vilant Inhale 2 puffs into the lungs daily.            Discharge Care Instructions  (From admission, onward)         Start     Ordered   06/11/20 0000  If the dressing is still on your incision site when you go home, remove it on the third day  after your surgery date. Remove dressing if it begins to fall off, or if it is dirty or damaged before the third day.        06/11/20 1402          Contact information for follow-up providers    Evon Slack, PA-C Follow up in 2 week(s).   Specialties: Orthopedic Surgery, Emergency Medicine Contact information: [address] [place] [address] [phone]        Armando Gang, FNP Follow up in 1 week(s).   Specialty: Family Medicine Contact information: [address] [place] [address] [phone]            Contact information for after-discharge care    Destination    [facility] Preferred SNF .   Service: Skilled  Nursing Contact information: [address] [place] [address] [phone]                 No Known Allergies  Consultations:  Orthopedics   Procedures/Studies: DG Chest 1 View  Result Date: 06/05/2020 CLINICAL DATA:  Leg pain after fall. EXAM: CHEST  1 VIEW COMPARISON:  Jul 09, 2013 FINDINGS: There is new infiltrate in the right lung base. Rounded opacity projected over the right upper lung in the perihilar region. The heart, hila, and mediastinum are otherwise unremarkable. Skin fold seen over the lateral left lung base. No pneumothorax. No other abnormalities. IMPRESSION: 1. New infiltrate in the right base worrisome for pneumonia or aspiration. Recommend attention on short-term follow-up imaging after treatment. 2. Rounded opacity over the right upper lung is favored to represent confluence of shadows. Recommend attention on follow-up. Electronically Signed   By: Gerome Sam III M.D   On: 06/05/2020 18:12   DG Knee 1-2 Views Left  Result Date: 06/05/2020 CLINICAL DATA:  Pain after fall EXAM: LEFT KNEE - 1-2 VIEW COMPARISON:  None. FINDINGS: The study is limited due to positioning. No fractures, dislocation, or joint effusion identified. Vascular calcifications. IMPRESSION: Limited  study due to positioning.  No abnormalities noted. Electronically Signed   By: Gerome Sam III M.D   On: 06/05/2020 18:10   DG Tibia/Fibula Right  Result Date: 06/08/2020 CLINICAL DATA:  IM nail to the right tibia EXAM: RIGHT TIBIA AND FIBULA - 2 VIEW; DG C-ARM 1-60 MIN COMPARISON:  06/05/2020 FINDINGS: Intraoperative fluoroscopy is obtained for surgical control purposes. Fluoroscopy time is recorded at 1 minute 20 seconds. Exposure dose parameters are not provided. Two spot fluoroscopic images are obtained. Spot fluoroscopic images demonstrate intramedullary rod in the tibia with single proximal and 2 distal locking screws. The midshaft is not included within the field of view and the fracture seen previously is not visualized on these views. IMPRESSION: Intraoperative fluoroscopy obtained for surgical control purposes demonstrating intramedullary rod fixation of the tibia. Electronically Signed   By: Burman Nieves M.D.   On: 06/08/2020 21:59   DG Tibia/Fibula Right  Result Date: 06/05/2020 CLINICAL DATA:  Pain after fall EXAM: RIGHT TIBIA AND FIBULA - 2 VIEW COMPARISON:  None. FINDINGS: There is a lucency associated with the medial aspect of the proximal fibula, not definitely extending through the entire shaft. No other fibular abnormalities. There is a comminuted mildly displaced fracture through the mid tibial diaphysis. IMPRESSION: 1. Comminuted mildly displaced fracture through the mid tibial diaphysis. 2. There is a lucency associated with the medial aspect of the proximal fibula, not definitely extending through the entire shaft. A subtle fracture is not excluded. Electronically Signed   By: Gerome Sam III M.D   On: 06/05/2020 18:09   CT Head Wo Contrast  Result Date: 06/05/2020 CLINICAL DATA:  Fall EXAM: CT HEAD WITHOUT CONTRAST TECHNIQUE: Contiguous axial images were obtained from the base of the skull through the vertex without intravenous contrast. COMPARISON:  09/19/2018 FINDINGS:  Brain: No acute intracranial abnormality. Specifically, no hemorrhage, hydrocephalus, mass lesion, acute infarction, or significant intracranial injury. Vascular: No hyperdense vessel or unexpected calcification. Skull: No acute calvarial abnormality. Sinuses/Orbits: No acute findings Other: None IMPRESSION: No acute intracranial abnormality. Electronically Signed   By: Charlett Nose M.D.   On: 06/05/2020 20:06   CT ANGIO CHEST PE W OR WO CONTRAST  Result Date: 06/07/2020 CLINICAL DATA:  Cough cough fall landing on right side. EXAM: CT ANGIOGRAPHY CHEST  WITH CONTRAST TECHNIQUE: Multidetector CT imaging of the chest was performed using the standard protocol during bolus administration of intravenous contrast. Multiplanar CT image reconstructions and MIPs were obtained to evaluate the vascular anatomy. CONTRAST:  75mL OMNIPAQUE IOHEXOL 350 MG/ML SOLN COMPARISON:  None. FINDINGS: Cardiovascular: No filling defects in the pulmonary arteries to suggest pulmonary emboli. Heart is normal size. Diffuse coronary artery and aortic atherosclerosis. No aneurysm. Mediastinum/Nodes: No mediastinal, hilar, or axillary adenopathy. Trachea and esophagus are unremarkable. Thyroid unremarkable. Lungs/Pleura: Multiple rounded opacities in the posteroinferior right upper lobe and posterolateral right lower lobe. Largest rounded opacity measures up to 3.1 cm. This may reflect pneumonia but warrants follow-up given the masslike appearance. In addition. Extensive nodular airspace disease in the right upper lobe with largest nodule measuring 9 mm on image 30. This also may be infectious/inflammatory but warrants follow-up. No effusions. Left lung clear. Upper Abdomen: Imaging into the upper abdomen demonstrates no acute findings. Musculoskeletal: Chest wall soft tissues are unremarkable. No acute bony abnormality. Review of the MIP images confirms the above findings. IMPRESSION: Nodular airspace disease throughout much of the right lung  with numerous small nodules in the right upper lobe and larger nodules in the inferior right upper lobe and right lower lobe. This could reflect pneumonia. Followup chest CT is recommended in 3-4 weeks following trial of antibiotic therapy to ensure resolution and exclude underlying malignancy. Extensive coronary artery disease. Aortic Atherosclerosis (ICD10-I70.0). Electronically Signed   By: Charlett NoseKevin  Dover M.D.   On: 06/07/2020 18:27   CT Cervical Spine Wo Contrast  Result Date: 06/05/2020 CLINICAL DATA:  Fall EXAM: CT CERVICAL SPINE WITHOUT CONTRAST TECHNIQUE: Multidetector CT imaging of the cervical spine was performed without intravenous contrast. Multiplanar CT image reconstructions were also generated. COMPARISON:  12/24/2015 FINDINGS: Alignment: Slight anterolisthesis of C5 on C6 related to facet disease. Skull base and vertebrae: No acute fracture. No primary bone lesion or focal pathologic process. Soft tissues and spinal canal: No prevertebral fluid or swelling. No visible canal hematoma. Disc levels: Posterior fusion from C2-C5. Degenerative disc disease and facet disease diffusely. Upper chest: No acute findings Other: None IMPRESSION: Postoperative and degenerative changes in the cervical spine. No acute bony abnormality. Electronically Signed   By: Charlett NoseKevin  Dover M.D.   On: 06/05/2020 20:11   CT Knee Left Wo Contrast  Result Date: 06/05/2020 CLINICAL DATA:  Trauma EXAM: CT OF THE LEFT KNEE WITHOUT CONTRAST TECHNIQUE: Multidetector CT imaging of the left knee was performed according to the standard protocol. Multiplanar CT image reconstructions were also generated. COMPARISON:  Plain films earlier today FINDINGS: Bones/Joint/Cartilage Diffuse osteopenia. No fracture at the left knee. Old healed proximal fibular fracture. Previously seen mid tibial fracture by plain films not visualized on this study. Ligaments Suboptimally assessed by CT. Muscles and Tendons Grossly intact Soft tissues No significant  joint effusion.  Diffuse vascular calcifications. IMPRESSION: No visible fracture at the left knee. Mid tibial fracture not imaged on this CT study. Trace joint effusion. Ligaments suboptimally assessed by CT. If there is high concern for ligamentous injury, MRI would be recommended for further evaluation. Electronically Signed   By: Charlett NoseKevin  Dover M.D.   On: 06/05/2020 20:28   DG Tibia/Fibula Right Port  Result Date: 06/08/2020 CLINICAL DATA:  Post ORIF right tib fib EXAM: PORTABLE RIGHT TIBIA AND FIBULA - 2 VIEW COMPARISON:  06/05/2020 FINDINGS: Postoperative changes with intramedullary rod fixation of an oblique fracture of the midshaft right tibia. A single proximal and 2 distal locking screws  are present. Near-anatomic alignment is demonstrated in the fracture fragments with minimal residual displacement. Oblique comminuted fracture of the proximal right fibula is again demonstrated. Minimal posterior and medial displacement of the distal fracture fragment resulting in slight cortical step-off. Skin clips are consistent with recent surgery. IMPRESSION: Postoperative intramedullary rod fixation of fractures of the midshaft right tibia. Electronically Signed   By: Burman Nieves M.D.   On: 06/08/2020 23:08   DG C-Arm 1-60 Min  Result Date: 06/08/2020 CLINICAL DATA:  IM nail to the right tibia EXAM: RIGHT TIBIA AND FIBULA - 2 VIEW; DG C-ARM 1-60 MIN COMPARISON:  06/05/2020 FINDINGS: Intraoperative fluoroscopy is obtained for surgical control purposes. Fluoroscopy time is recorded at 1 minute 20 seconds. Exposure dose parameters are not provided. Two spot fluoroscopic images are obtained. Spot fluoroscopic images demonstrate intramedullary rod in the tibia with single proximal and 2 distal locking screws. The midshaft is not included within the field of view and the fracture seen previously is not visualized on these views. IMPRESSION: Intraoperative fluoroscopy obtained for surgical control purposes  demonstrating intramedullary rod fixation of the tibia. Electronically Signed   By: Burman Nieves M.D.   On: 06/08/2020 21:59   ECHOCARDIOGRAM COMPLETE  Result Date: 06/07/2020    ECHOCARDIOGRAM REPORT   Patient Name:   Aaron Harvey Saad Date of Exam: 06/07/2020 Medical Rec #:  [id number]     Height:       68.0 in Accession #:    [id number]    Weight:       195.3 lb Date of Birth:  10-Mar-1955      BSA:          2.023 m Patient Age:    64 years      BP:           112/66 mmHg Patient Gender: M             HR:           64 bpm. Exam Location:  [facility] Procedure: 2D Echo, Color Doppler and Cardiac Doppler Indications:     R01.2 Other cardiac sounds  History:         Patient has no prior history of Echocardiogram examinations.                  COPD; Risk Factors:Hypertension.  Sonographer:     Humphrey Rolls RDCS (AE) Referring Phys:  [id number] Gillis Santa Diagnosing Phys: Alwyn Pea MD  Sonographer Comments: Image acquisition challenging due to COPD, Image acquisition challenging due to respiratory motion and Image acquisition challenging due to patient body habitus. IMPRESSIONS  1. Normal LVF Normal Echo.  2. Left ventricular ejection fraction, by estimation, is 65 to 70%. The left ventricle has normal function. The left ventricle has no regional wall motion abnormalities. Left ventricular diastolic parameters were normal.  3. Right ventricular systolic function is normal. The right ventricular size is normal.  4. The mitral valve is normal in structure. No evidence of mitral valve regurgitation.  5. The aortic valve is normal in structure. Aortic valve regurgitation is not visualized. Conclusion(s)/Recommendation(s): Normal biventricular function without evidence of hemodynamically significant valvular heart disease. FINDINGS  Left Ventricle: Left ventricular ejection fraction, by estimation, is 65 to 70%. The left ventricle has normal function. The left ventricle has no regional wall motion abnormalities. The left  ventricular internal cavity size was normal in size. There is  no left ventricular hypertrophy. Left ventricular diastolic parameters were normal. Right Ventricle: The  right ventricular size is normal. No increase in right ventricular wall thickness. Right ventricular systolic function is normal. Left Atrium: Left atrial size was normal in size. Right Atrium: Right atrial size was normal in size. Pericardium: There is no evidence of pericardial effusion. Mitral Valve: The mitral valve is normal in structure. No evidence of mitral valve regurgitation. MV peak gradient, 4.6 mmHg. The mean mitral valve gradient is 1.0 mmHg. Tricuspid Valve: The tricuspid valve is normal in structure. Tricuspid valve regurgitation is trivial. Aortic Valve: The aortic valve is normal in structure. Aortic valve regurgitation is not visualized. Aortic valve mean gradient measures 3.0 mmHg. Aortic valve peak gradient measures 6.7 mmHg. Aortic valve area, by VTI measures 2.46 cm. Pulmonic Valve: The pulmonic valve was normal in structure. Pulmonic valve regurgitation is not visualized. Aorta: The ascending aorta was not well visualized. IAS/Shunts: No atrial level shunt detected by color flow Doppler. Additional Comments: Normal LVF Normal Echo.  LEFT VENTRICLE PLAX 2D LVIDd:         4.40 cm  Diastology LVIDs:         2.70 cm  LV e' medial:    7.94 cm/s LV PW:         1.10 cm  LV E/e' medial:  11.4 LV IVS:        0.90 cm  LV e' lateral:   10.20 cm/s LVOT diam:     2.00 cm  LV E/e' lateral: 8.9 LV SV:         60 LV SV Index:   30 LVOT Area:     3.14 cm  RIGHT VENTRICLE RV Basal diam:  3.00 cm LEFT ATRIUM           Index       RIGHT ATRIUM           Index LA Vol (A4C): 52.8 ml 26.10 ml/m RA Area:     15.00 cm                                   RA Volume:   33.50 ml  16.56 ml/m  AORTIC VALVE AV Area (Vmax):    2.26 cm AV Area (Vmean):   2.35 cm AV Area (VTI):     2.46 cm AV Vmax:           129.00 cm/s AV Vmean:          82.400 cm/s AV  VTI:            0.243 m AV Peak Grad:      6.7 mmHg AV Mean Grad:      3.0 mmHg LVOT Vmax:         92.70 cm/s LVOT Vmean:        61.600 cm/s LVOT VTI:          0.190 m LVOT/AV VTI ratio: 0.78  AORTA Ao Root diam: 3.40 cm MITRAL VALVE MV Area (PHT): 2.60 cm    SHUNTS MV Area VTI:   1.91 cm    Systemic VTI:  0.19 m MV Peak grad:  4.6 mmHg    Systemic Diam: 2.00 cm MV Mean grad:  1.0 mmHg MV Vmax:       1.07 m/s MV Vmean:      55.2 cm/s MV Decel Time: 292 msec MV E velocity: 90.40 cm/s MV A velocity: 35.10 cm/s MV E/A ratio:  2.58 Alwyn Pea MD Electronically  signed by Alwyn Pea MD Signature Date/Time: 06/07/2020/4:30:37 PM    Final    DG Femur Min 2 Views Left  Result Date: 06/05/2020 CLINICAL DATA:  Pain after fall. EXAM: LEFT FEMUR 2 VIEWS COMPARISON:  None. FINDINGS: There is no evidence of fracture or other focal bone lesions. Soft tissues are unremarkable. IMPRESSION: Negative. Electronically Signed   By: Gerome Sam III M.D   On: 06/05/2020 18:07      Subjective: Has no complaints this a.m.  Discharge Exam: Vitals:   06/11/20 0758 06/11/20 1151  BP: 136/75 117/60  Pulse: 88 72  Resp: 19 17  Temp: 98.4 F (36.9 C) 98.4 F (36.9 C)  SpO2: 97% 93%   Vitals:   06/11/20 0440 06/11/20 0442 06/11/20 0758 06/11/20 1151  BP: 98/63  136/75 117/60  Pulse: 81 77 88 72  Resp: 18  19 17   Temp: 98.5 F (36.9 C)  98.4 F (36.9 C) 98.4 F (36.9 C)  TempSrc: Oral  Oral   SpO2: (!) 87% 95% 97% 93%  Weight:      Height:        General: Pt is alert, awake, not in acute distress Cardiovascular: RRR, S1/S2 +, no rubs, no gallops Respiratory: CTA bilaterally, no wheezing, no rhonchi Abdominal: Soft, NT, ND, bowel sounds + Extremities: no edema    The results of significant diagnostics from this hospitalization (including imaging, microbiology, ancillary and laboratory) are listed below for reference.     Microbiology: Recent Results (from the past 240 hour(s))  Blood  culture (routine x 2)     Status: None   Collection Time: 06/05/20  6:04 PM   Specimen: BLOOD  Result Value Ref Range Status   Specimen Description BLOOD BLOOD RIGHT FOREARM  Final   Special Requests   Final    BOTTLES DRAWN AEROBIC AND ANAEROBIC Blood Culture results may not be optimal due to an excessive volume of blood received in culture bottles   Culture   Final    NO GROWTH 5 DAYS Performed at [facility], [address] [place], [place]    Report Status 06/10/2020 FINAL  Final  Blood culture (routine x 2)     Status: None   Collection Time: 06/05/20  6:09 PM   Specimen: BLOOD  Result Value Ref Range Status   Specimen Description BLOOD LEFT ANTECUBITAL  Final   Special Requests   Final    BOTTLES DRAWN AEROBIC AND ANAEROBIC Blood Culture results may not be optimal due to an excessive volume of blood received in culture bottles   Culture   Final    NO GROWTH 5 DAYS Performed at [facility], [address] [place], [place]    Report Status 06/10/2020 FINAL  Final  Resp Panel by RT-PCR (Flu A&B, Covid) Nasopharyngeal Swab     Status: None   Collection Time: 06/05/20  6:37 PM   Specimen: Nasopharyngeal Swab; Nasopharyngeal(NP) swabs in vial transport medium  Result Value Ref Range Status   SARS Coronavirus 2 by RT PCR NEGATIVE NEGATIVE Final    Comment: (NOTE) SARS-CoV-2 target nucleic acids are NOT DETECTED.  The SARS-CoV-2 RNA is generally detectable in upper respiratory specimens during the acute phase of infection. The lowest concentration of SARS-CoV-2 viral copies this assay can detect is 138 copies/mL. A negative result does not preclude SARS-Cov-2 infection and should not be used as the sole basis for treatment or other patient management decisions. A negative result may  occur with  improper specimen collection/handling, submission of specimen other than nasopharyngeal swab, presence of viral mutation(s) within the areas  targeted by this assay, and inadequate number of viral copies(<138 copies/mL). A negative result must be combined with clinical observations, patient history, and epidemiological information. The expected result is Negative.  Fact Sheet for Patients:  BloggerCourse.com  Fact Sheet for Healthcare Providers:  SeriousBroker.it  This test is no t yet approved or cleared by the [place] FDA and  has been authorized for detection and/or diagnosis of SARS-CoV-2 by FDA under an Emergency Use Authorization (EUA). This EUA will remain  in effect (meaning this test can be used) for the duration of the COVID-19 declaration under Section 564(b)(1) of the Act, 21 U.S.C.section 360bbb-3(b)(1), unless the authorization is terminated  or revoked sooner.       Influenza A by PCR NEGATIVE NEGATIVE Final   Influenza B by PCR NEGATIVE NEGATIVE Final    Comment: (NOTE) The Xpert Xpress SARS-CoV-2/FLU/RSV plus assay is intended as an aid in the diagnosis of influenza from Nasopharyngeal swab specimens and should not be used as a sole basis for treatment. Nasal washings and aspirates are unacceptable for Xpert Xpress SARS-CoV-2/FLU/RSV testing.  Fact Sheet for Patients: BloggerCourse.com  Fact Sheet for Healthcare Providers: SeriousBroker.it  This test is not yet approved or cleared by the [place] FDA and has been authorized for detection and/or diagnosis of SARS-CoV-2 by FDA under an Emergency Use Authorization (EUA). This EUA will remain in effect (meaning this test can be used) for the duration of the COVID-19 declaration under Section 564(b)(1) of the Act, 21 U.S.C. section 360bbb-3(b)(1), unless the authorization is terminated or revoked.  Performed at [facility], [address] [place], [place] [address]      Labs: BNP (last 3 results) Recent Labs     06/06/20 0859  BNP 525.0*   Basic Metabolic Panel: Recent Labs  Lab 06/06/20 0859 06/07/20 0514 06/08/20 0527 06/09/20 0622 06/10/20 0425 06/11/20 0432  NA 131* 137 140 139 141 141  K 4.4 4.3 3.7 3.6 3.7 3.6  CL 96* 101 100 99 102 104  CO2 26 28 29 29 29 27   GLUCOSE 152* 105* 98 101* 91 96  BUN 13 17 17 13 10 10   CREATININE 0.72 1.12 1.04 0.83 0.80 0.87  CALCIUM 8.3* 8.7* 8.9 8.5* 8.4* 8.3*  MG 1.9 2.0  --   --   --   --   PHOS 3.5 3.5  --   --   --   --    Liver Function Tests: Recent Labs  Lab 06/05/20 1805  AST 26  ALT 15  ALKPHOS 78  BILITOT 1.2  PROT 7.1  ALBUMIN 3.0*   No results for input(s): LIPASE, AMYLASE in the last 168 hours. No results for input(s): AMMONIA in the last 168 hours. CBC: Recent Labs  Lab 06/05/20 1805 06/05/20 2259 06/07/20 0514 06/08/20 0527 06/09/20 0622 06/10/20 0425 06/11/20 0432  WBC 10.5   < > 12.4* 12.1* 12.8* 10.3 9.5  NEUTROABS 8.5*  --   --   --   --   --   --   HGB 13.4   < > 10.6* 11.1* 10.3* 9.7* 9.3*  HCT 40.0   < > 33.3* 34.0* 32.2* 31.1* 30.0*  MCV 87.5   < > 91.5 91.4 91.7 93.4 93.2  PLT 417*   < > 384 389 364 338 332   < > = values in this interval  not displayed.   Cardiac Enzymes: No results for input(s): CKTOTAL, CKMB, CKMBINDEX, TROPONINI in the last 168 hours. BNP: Invalid input(s): POCBNP CBG: Recent Labs  Lab 06/08/20 2205 06/08/20 2238  GLUCAP 67* 104*   D-Dimer No results for input(s): DDIMER in the last 72 hours. Hgb A1c No results for input(s): HGBA1C in the last 72 hours. Lipid Profile No results for input(s): CHOL, HDL, LDLCALC, TRIG, CHOLHDL, LDLDIRECT in the last 72 hours. Thyroid function studies No results for input(s): TSH, T4TOTAL, T3FREE, THYROIDAB in the last 72 hours.  Invalid input(s): FREET3 Anemia work up No results for input(s): VITAMINB12, FOLATE, FERRITIN, TIBC, IRON, RETICCTPCT in the last 72 hours. Urinalysis    Component Value Date/Time   COLORURINE AMBER (A)  10/01/2018 0123   APPEARANCEUR CLEAR (A) 10/01/2018 0123   LABSPEC 1.014 10/01/2018 0123   PHURINE 6.0 10/01/2018 0123   GLUCOSEU NEGATIVE 10/01/2018 0123   HGBUR NEGATIVE 10/01/2018 0123   BILIRUBINUR NEGATIVE 10/01/2018 0123   KETONESUR NEGATIVE 10/01/2018 0123   PROTEINUR NEGATIVE 10/01/2018 0123   NITRITE NEGATIVE 10/01/2018 0123   LEUKOCYTESUR NEGATIVE 10/01/2018 0123   Sepsis Labs Invalid input(s): PROCALCITONIN,  WBC,  LACTICIDVEN Microbiology Recent Results (from the past 240 hour(s))  Blood culture (routine x 2)     Status: None   Collection Time: 06/05/20  6:04 PM   Specimen: BLOOD  Result Value Ref Range Status   Specimen Description BLOOD BLOOD RIGHT FOREARM  Final   Special Requests   Final    BOTTLES DRAWN AEROBIC AND ANAEROBIC Blood Culture results may not be optimal due to an excessive volume of blood received in culture bottles   Culture   Final    NO GROWTH 5 DAYS Performed at [facility], [address] [place], [place] [address]    Report Status 06/10/2020 FINAL  Final  Blood culture (routine x 2)     Status: None   Collection Time: 06/05/20  6:09 PM   Specimen: BLOOD  Result Value Ref Range Status   Specimen Description BLOOD LEFT ANTECUBITAL  Final   Special Requests   Final    BOTTLES DRAWN AEROBIC AND ANAEROBIC Blood Culture results may not be optimal due to an excessive volume of blood received in culture bottles   Culture   Final    NO GROWTH 5 DAYS Performed at [facility], [address] [place], [place] [address]    Report Status 06/10/2020 FINAL  Final  Resp Panel by RT-PCR (Flu A&B, Covid) Nasopharyngeal Swab     Status: None   Collection Time: 06/05/20  6:37 PM   Specimen: Nasopharyngeal Swab; Nasopharyngeal(NP) swabs in vial transport medium  Result Value Ref Range Status   SARS Coronavirus 2 by RT PCR NEGATIVE NEGATIVE Final    Comment: (NOTE) SARS-CoV-2 target nucleic acids are NOT DETECTED.  The SARS-CoV-2 RNA  is generally detectable in upper respiratory specimens during the acute phase of infection. The lowest concentration of SARS-CoV-2 viral copies this assay can detect is 138 copies/mL. A negative result does not preclude SARS-Cov-2 infection and should not be used as the sole basis for treatment or other patient management decisions. A negative result may occur with  improper specimen collection/handling, submission of specimen other than nasopharyngeal swab, presence of viral mutation(s) within the areas targeted by this assay, and inadequate number of viral copies(<138 copies/mL). A negative result must be combined with clinical observations, patient history, and epidemiological information. The expected result is Negative.  Fact  Sheet for Patients:  BloggerCourse.com  Fact Sheet for Healthcare Providers:  SeriousBroker.it  This test is no t yet approved or cleared by the [place] FDA and  has been authorized for detection and/or diagnosis of SARS-CoV-2 by FDA under an Emergency Use Authorization (EUA). This EUA will remain  in effect (meaning this test can be used) for the duration of the COVID-19 declaration under Section 564(b)(1) of the Act, 21 U.S.C.section 360bbb-3(b)(1), unless the authorization is terminated  or revoked sooner.       Influenza A by PCR NEGATIVE NEGATIVE Final   Influenza B by PCR NEGATIVE NEGATIVE Final    Comment: (NOTE) The Xpert Xpress SARS-CoV-2/FLU/RSV plus assay is intended as an aid in the diagnosis of influenza from Nasopharyngeal swab specimens and should not be used as a sole basis for treatment. Nasal washings and aspirates are unacceptable for Xpert Xpress SARS-CoV-2/FLU/RSV testing.  Fact Sheet for Patients: BloggerCourse.com  Fact Sheet for Healthcare Providers: SeriousBroker.it  This test is not yet approved or cleared by the  [place] FDA and has been authorized for detection and/or diagnosis of SARS-CoV-2 by FDA under an Emergency Use Authorization (EUA). This EUA will remain in effect (meaning this test can be used) for the duration of the COVID-19 declaration under Section 564(b)(1) of the Act, 21 U.S.C. section 360bbb-3(b)(1), unless the authorization is terminated or revoked.  Performed at [facility], [address], [place], [place] [address]      Time coordinating discharge: Over 30 minutes  SIGNED:   Lynn Ito, MD  [facility] 06/11/2020, 2:55 PM Pager   If 7PM-7AM, please contact night-coverage [email or web address] Password [email or web address]

## 2020-06-11 NOTE — TOC Progression Note (Signed)
Transition of Care [facility]) - Progression Note    Patient Details  Name: Aaron Harvey MRN: [id number] Date of Birth: [birth year]  Transition of Care [facility]) CM/SW Contact  Barrie Dunker, RN Phone Number: 06/11/2020, 3:07 PM  Clinical Narrative:   [facility] transport to transport the patient to [facility] room [phone] They will transport in a wheelchair vehicle  Will pick up at the [facility] entrance Will call the desk on arrival to [phone]    Expected Discharge Plan: Skilled Nursing Facility Barriers to Discharge: Barriers Resolved  Expected Discharge Plan and Services Expected Discharge Plan: Skilled Nursing Facility         Expected Discharge Date: 06/11/20                                     Social Determinants of Health (SDOH) Interventions    Readmission Risk Interventions No flowsheet data found.

## 2020-06-11 NOTE — TOC Progression Note (Signed)
Transition of Care [facility]) - Progression Note    Patient Details  Name: Aaron Harvey MRN: [id number] Date of Birth: 14-Nov-1955  Transition of Care [facility]) CM/SW Contact  Barrie Dunker, RN Phone Number: 06/11/2020, 1:52 PM  Clinical Narrative:   [facility] Notified of Approval for the patient to go today, Approval number  [id number] , [id number] Notified [facility], awaiting Covid swab        Expected Discharge Plan and Services                                                 Social Determinants of Health (SDOH) Interventions    Readmission Risk Interventions No flowsheet data found.

## 2020-06-11 NOTE — TOC Progression Note (Signed)
Transition of Care [facility]) - Progression Note    Patient Details  Name: Aaron Harvey MRN: [id number] Date of Birth: 09-Jan-1956  Transition of Care [facility]) CM/SW Contact  Barrie Dunker, RN Phone Number: 06/11/2020, 3:39 PM  Clinical Narrative:   Leonette Most with [facility] transport called and stated that he is here to pick up the patient to transport to [facility], I explained to Leonette Most that the patient needs a lot of assistance getting to a wheelchair Provided Leonette Most with the phone number to [facility] to call when he arrives so that they can help the patient get into the facility with a wheelchair, The nurse was notified that [facility] is here to pick up the patient    Expected Discharge Plan: Skilled Nursing Facility Barriers to Discharge: Barriers Resolved  Expected Discharge Plan and Services Expected Discharge Plan: Skilled Nursing Facility         Expected Discharge Date: 06/11/20                                     Social Determinants of Health (SDOH) Interventions    Readmission Risk Interventions No flowsheet data found.

## 2020-06-11 NOTE — Progress Notes (Signed)
Pt d/c to [facility] via [facility].  NAD noted VSS.  All personal belongings taken at time of d/c.  IV removed from pt L forearm without issue.  Kendal Hymen, pts niece was notified when pt left.

## 2020-06-11 NOTE — Progress Notes (Signed)
This nurse and NT attempted to assist pt from wheelchair into [facility] car but pt was unable to stand and assist.  Pt almost fell and required 3-4 people to get him safely back into the wheelchair.  Case management was called and informed we would need EMS to transport.  Once back in the room he required use of the steady lift, this nurse, NT and OT to get back into the bed safely.  EMS was called and pt was placed 3rd on the list for pickup.     Nurse has attempted to call report x2 to [facility] and was unsuccessful.

## 2020-06-11 NOTE — TOC Progression Note (Signed)
Transition of Care [facility]) - Progression Note    Patient Details  Name: Aaron Harvey MRN: [id number] Date of Birth: Oct 29, 1955  Transition of Care [facility]) CM/SW Contact  Barrie Dunker, RN Phone Number: 06/11/2020, 11:24 AM  Clinical Narrative:   Sherron Monday with Kendal Hymen the patient's niece, reviewed the bed options, she accepted the bed from [facility], Notified Tammy at [facility], will start Insurance auth approval         Expected Discharge Plan and Services                                                 Social Determinants of Health (SDOH) Interventions    Readmission Risk Interventions No flowsheet data found.

## 2020-06-11 NOTE — TOC Progression Note (Signed)
Transition of Care [facility]) - Progression Note    Patient Details  Name: Aaron Harvey MRN: [id number] Date of Birth: [birth year]  Transition of Care [facility]) CM/SW Contact  Barrie Dunker, RN Phone Number: 06/11/2020, 3:55 PM  Clinical Narrative:   The nurse and NT tried to assist the patient into the transport van, the patient is too weak and almost fell, the driver refused to transport the patient for safety reasons, Called [facility] to transport, there are 3 ahead of the patient on the list    Expected Discharge Plan: Skilled Nursing Facility Barriers to Discharge: Barriers Resolved  Expected Discharge Plan and Services Expected Discharge Plan: Skilled Nursing Facility         Expected Discharge Date: 06/11/20                                     Social Determinants of Health (SDOH) Interventions    Readmission Risk Interventions No flowsheet data found.

## 2020-06-11 NOTE — Progress Notes (Signed)
   Subjective: 3 Days Post-Op Procedure(s) (LRB): INTRAMEDULLARY (IM) NAIL TIBIAL (Right) Patient reports pain as mild.   Patient is well, and has had no acute complaints or problems Denies any CP, SOB, ABD pain. We will continue therapy today.   Objective: Vital signs in last 24 hours: Temp:  [98.2 F (36.8 C)-98.7 F (37.1 C)] 98.5 F (36.9 C) (04/08 0440) Pulse Rate:  [77-86] 77 (04/08 0442) Resp:  [16-20] 18 (04/08 0440) BP: (98-147)/(63-86) 98/63 (04/08 0440) SpO2:  [87 %-95 %] 95 % (04/08 0442)  Intake/Output from previous day: 04/07 0701 - 04/08 0700 In: 720 [P.O.:720] Out: 425 [Urine:425] Intake/Output this shift: No intake/output data recorded.  Recent Labs    06/09/20 0622 06/10/20 0425 06/11/20 0432  HGB 10.3* 9.7* 9.3*   Recent Labs    06/10/20 0425 06/11/20 0432  WBC 10.3 9.5  RBC 3.33* 3.22*  HCT 31.1* 30.0*  PLT 338 332   Recent Labs    06/10/20 0425 06/11/20 0432  NA 141 141  K 3.7 3.6  CL 102 104  CO2 29 27  BUN 10 10  CREATININE 0.80 0.87  GLUCOSE 91 96  CALCIUM 8.4* 8.3*   No results for input(s): LABPT, INR in the last 72 hours.  EXAM General - Patient is Alert, Appropriate and Oriented Extremity - Neurovascular intact Sensation intact distally No cellulitis present Compartment soft Dressing - dressing C/D/I and no drainage Motor Function - intact, moving foot and toes well on exam.   Past Medical History:  Diagnosis Date  . COPD (chronic obstructive pulmonary disease) (HCC)   . Gout   . Hypertension     Assessment/Plan:   3 Days Post-Op Procedure(s) (LRB): INTRAMEDULLARY (IM) NAIL TIBIAL (Right) Principal Problem:   RLL pneumonia Active Problems:   Hypertension   COPD (chronic obstructive pulmonary disease) (HCC)   Gout   Closed displaced spiral fracture of shaft of right tibia   Acute respiratory failure with hypoxia (HCC)   Accidental fall   Pneumonia   Acute urinary retention   Alcohol consumption binge  drinking  Estimated body mass index is 29.7 kg/m as calculated from the following:   Height as of this encounter: 5\' 8"  (1.727 m).   Weight as of this encounter: 88.6 kg. Advance diet Up with therapy  Pain well controlled Vital signs stable Care manager to assist with discharge to SNF  Patient will need follow-up with [facility] orthopedics 2 weeks postop Norco Rx in chart Aspirin for DVT prophylaxis at discharge  DVT Prophylaxis - Lovenox Weight-Bearing as tolerated to right leg   [facility], PA-C [facility] 06/11/2020, 7:52 AM

## 2020-06-11 NOTE — Progress Notes (Signed)
Physical Therapy Treatment Patient Details Name: Aaron Harvey MRN: [id number] DOB: 1955/11/04 Today's Date: 06/11/2020    History of Present Illness Patient is a 65 yo male s/p fall at home, s/p R tibial IM nail, per PT order WBAT.  Also found to have RLL PNA, acute respiratory failure, potential COPD exacerbation, acute urinary retention. PMH of COPD, gout, HTN, previous back surgery, etoh.    PT Comments    Patient alert, agreeable to PT with encouragement, referenced a headache. RN notified at end of session of pt request for medication. Session focused on functional mobility; able to perform bed mobility with CGA and bed rails (including rolling for bed pan). Able to sit EOB for several minutes for seated balance exercises as well as ADLs with set up. Sit <> stand attempted x2 with RW and maxA, pt still with difficulty with RLE weight bearing due to decreased confidence/pain. Pt declined OOB to chair, but was able to laterally scoot on the bed with bed rails and minA. Returned to supine, all needs in reach and pt positioned to maximize TKE bilaterally. The patient would benefit from further skilled PT intervention to continue to progress towards goals. Recommendation remains appropriate.     Follow Up Recommendations  SNF     Equipment Recommendations  Other (comment) (TBD at next venue of care)    Recommendations for Other Services OT consult     Precautions / Restrictions Precautions Precautions: Fall Restrictions Weight Bearing Restrictions: Yes RLE Weight Bearing: Weight bearing as tolerated    Mobility  Bed Mobility Overal bed mobility: Needs Assistance Bed Mobility: Supine to Sit;Sit to Supine Rolling: Supervision   Supine to sit: Min guard;HOB elevated Sit to supine: Min guard   General bed mobility comments: use of bed rails    Transfers Overall transfer level: Needs assistance Equipment used: Rolling walker (2 wheeled) Transfers: Sit to/from Stand Sit to  Stand: Max assist        Lateral/Scoot Transfers: Min assist General transfer comment: lateral scoot at EOB with use of bed rails and cueing, effortful. declined out of bed at this time  Ambulation/Gait                 Stairs             Wheelchair Mobility    Modified Rankin (Stroke Patients Only)       Balance Overall balance assessment: Needs assistance Sitting-balance support: Feet supported Sitting balance-Leahy Scale: Fair       Standing balance-Leahy Scale: Zero                              Cognition Arousal/Alertness: Awake/alert Behavior During Therapy: WFL for tasks assessed/performed Overall Cognitive Status: Within Functional Limits for tasks assessed                                 General Comments: pt is oriented to place, situation, and self but not time including month/year. He is appropriate wtih all command following and conversationally.      Exercises Other Exercises Other Exercises: seated EOB with unilateral support, able to brush teeth put on deoderant and clean face with set up. Other Exercises: seated balance perturbations 2x20seconds multidirectional without UE support. seated L foot DF/PF x15. Pt left in bed with legs as extended as possible Other Exercises: bed pan with cga for rolling, totalA  for pericare    General Comments        Pertinent Vitals/Pain Pain Assessment: Faces Faces Pain Scale: Hurts even more Pain Location: bilateral leg pain R>L Pain Descriptors / Indicators: Aching;Sore Pain Intervention(s): Limited activity within patient's tolerance;Monitored during session;Repositioned;Patient requesting pain meds-RN notified    Home Living                      Prior Function            PT Goals (current goals can now be found in the care plan section) Progress towards PT goals: Progressing toward goals    Frequency    BID      PT Plan Current plan remains  appropriate    Co-evaluation              AM-PAC PT "6 Clicks" Mobility   Outcome Measure  Help needed turning from your back to your side while in a flat bed without using bedrails?: A Little Help needed moving from lying on your back to sitting on the side of a flat bed without using bedrails?: A Little Help needed moving to and from a bed to a chair (including a wheelchair)?: A Lot Help needed standing up from a chair using your arms (e.g., wheelchair or bedside chair)?: Total Help needed to walk in hospital room?: Total Help needed climbing 3-5 steps with a railing? : Total 6 Click Score: 11    End of Session Equipment Utilized During Treatment: Gait belt Activity Tolerance: Patient tolerated treatment well Patient left: in bed;with bed alarm set;with call bell/phone within reach Nurse Communication: Mobility status PT Visit Diagnosis: Other abnormalities of gait and mobility (R26.89);Muscle weakness (generalized) (M62.81);Difficulty in walking, not elsewhere classified (R26.2);Pain Pain - Right/Left: Right Pain - part of body: Knee     Time: 7530-0511 PT Time Calculation (min) (ACUTE ONLY): 30 min  Charges:  $Therapeutic Exercise: 8-22 mins $Therapeutic Activity: 8-22 mins                     Olga Coaster PT, DPT 10:26 AM,06/11/20

## 2020-06-12 DIAGNOSIS — E871 Hypo-osmolality and hyponatremia: Secondary | ICD-10-CM | POA: Diagnosis not present

## 2020-06-12 DIAGNOSIS — I10 Essential (primary) hypertension: Secondary | ICD-10-CM | POA: Diagnosis not present

## 2020-06-12 DIAGNOSIS — M109 Gout, unspecified: Secondary | ICD-10-CM | POA: Diagnosis not present

## 2020-06-12 DIAGNOSIS — M84461D Pathological fracture, right tibia, subsequent encounter for fracture with routine healing: Secondary | ICD-10-CM | POA: Diagnosis not present

## 2020-06-12 DIAGNOSIS — J441 Chronic obstructive pulmonary disease with (acute) exacerbation: Secondary | ICD-10-CM | POA: Diagnosis not present

## 2020-06-12 DIAGNOSIS — R339 Retention of urine, unspecified: Secondary | ICD-10-CM | POA: Diagnosis not present

## 2020-06-12 DIAGNOSIS — M24569 Contracture, unspecified knee: Secondary | ICD-10-CM | POA: Diagnosis not present

## 2020-06-14 DIAGNOSIS — G8911 Acute pain due to trauma: Secondary | ICD-10-CM | POA: Diagnosis not present

## 2020-06-14 DIAGNOSIS — M84461D Pathological fracture, right tibia, subsequent encounter for fracture with routine healing: Secondary | ICD-10-CM | POA: Diagnosis not present

## 2020-06-14 DIAGNOSIS — G894 Chronic pain syndrome: Secondary | ICD-10-CM | POA: Diagnosis not present

## 2020-06-15 DIAGNOSIS — M62838 Other muscle spasm: Secondary | ICD-10-CM | POA: Diagnosis not present

## 2020-06-15 DIAGNOSIS — G8911 Acute pain due to trauma: Secondary | ICD-10-CM | POA: Diagnosis not present

## 2020-06-15 DIAGNOSIS — M84461D Pathological fracture, right tibia, subsequent encounter for fracture with routine healing: Secondary | ICD-10-CM | POA: Diagnosis not present

## 2020-06-15 DIAGNOSIS — G894 Chronic pain syndrome: Secondary | ICD-10-CM | POA: Diagnosis not present

## 2020-06-21 DIAGNOSIS — G8911 Acute pain due to trauma: Secondary | ICD-10-CM | POA: Diagnosis not present

## 2020-06-21 DIAGNOSIS — E785 Hyperlipidemia, unspecified: Secondary | ICD-10-CM | POA: Diagnosis not present

## 2020-06-21 DIAGNOSIS — M84461D Pathological fracture, right tibia, subsequent encounter for fracture with routine healing: Secondary | ICD-10-CM | POA: Diagnosis not present

## 2020-06-21 DIAGNOSIS — D75839 Thrombocytosis, unspecified: Secondary | ICD-10-CM | POA: Diagnosis not present

## 2020-06-22 DIAGNOSIS — Z8781 Personal history of (healed) traumatic fracture: Secondary | ICD-10-CM | POA: Diagnosis not present

## 2020-06-22 DIAGNOSIS — Z9889 Other specified postprocedural states: Secondary | ICD-10-CM | POA: Diagnosis not present

## 2020-06-24 DIAGNOSIS — R339 Retention of urine, unspecified: Secondary | ICD-10-CM | POA: Diagnosis not present

## 2020-06-24 DIAGNOSIS — I10 Essential (primary) hypertension: Secondary | ICD-10-CM | POA: Diagnosis not present

## 2020-06-24 DIAGNOSIS — M84461D Pathological fracture, right tibia, subsequent encounter for fracture with routine healing: Secondary | ICD-10-CM | POA: Diagnosis not present

## 2020-06-24 DIAGNOSIS — G8911 Acute pain due to trauma: Secondary | ICD-10-CM | POA: Diagnosis not present

## 2020-06-24 DIAGNOSIS — M62838 Other muscle spasm: Secondary | ICD-10-CM | POA: Diagnosis not present

## 2020-06-24 DIAGNOSIS — M24569 Contracture, unspecified knee: Secondary | ICD-10-CM | POA: Diagnosis not present

## 2020-06-24 DIAGNOSIS — J441 Chronic obstructive pulmonary disease with (acute) exacerbation: Secondary | ICD-10-CM | POA: Diagnosis not present

## 2020-06-24 DIAGNOSIS — W19XXXA Unspecified fall, initial encounter: Secondary | ICD-10-CM | POA: Diagnosis not present

## 2020-06-24 DIAGNOSIS — M109 Gout, unspecified: Secondary | ICD-10-CM | POA: Diagnosis not present

## 2020-06-29 DIAGNOSIS — J449 Chronic obstructive pulmonary disease, unspecified: Secondary | ICD-10-CM | POA: Diagnosis not present

## 2020-06-30 DIAGNOSIS — H2513 Age-related nuclear cataract, bilateral: Secondary | ICD-10-CM | POA: Diagnosis not present

## 2020-06-30 DIAGNOSIS — H25043 Posterior subcapsular polar age-related cataract, bilateral: Secondary | ICD-10-CM | POA: Diagnosis not present

## 2020-07-20 DIAGNOSIS — Z9889 Other specified postprocedural states: Secondary | ICD-10-CM | POA: Diagnosis not present

## 2020-07-20 DIAGNOSIS — M1A09X0 Idiopathic chronic gout, multiple sites, without tophus (tophi): Secondary | ICD-10-CM | POA: Diagnosis not present

## 2020-07-20 DIAGNOSIS — Z8781 Personal history of (healed) traumatic fracture: Secondary | ICD-10-CM | POA: Diagnosis not present

## 2020-07-29 DIAGNOSIS — J449 Chronic obstructive pulmonary disease, unspecified: Secondary | ICD-10-CM | POA: Diagnosis not present

## 2020-08-29 DIAGNOSIS — J449 Chronic obstructive pulmonary disease, unspecified: Secondary | ICD-10-CM | POA: Diagnosis not present

## 2020-08-31 ENCOUNTER — Encounter: Payer: Self-pay | Admitting: Family Medicine

## 2020-08-31 ENCOUNTER — Other Ambulatory Visit: Payer: Self-pay

## 2020-08-31 ENCOUNTER — Ambulatory Visit (INDEPENDENT_AMBULATORY_CARE_PROVIDER_SITE_OTHER): Payer: Medicare Other | Admitting: Family Medicine

## 2020-08-31 VITALS — BP 120/59 | HR 79 | Ht 68.0 in | Wt 218.0 lb

## 2020-08-31 DIAGNOSIS — Z7689 Persons encountering health services in other specified circumstances: Secondary | ICD-10-CM | POA: Diagnosis not present

## 2020-08-31 DIAGNOSIS — M8949 Other hypertrophic osteoarthropathy, multiple sites: Secondary | ICD-10-CM | POA: Diagnosis not present

## 2020-08-31 DIAGNOSIS — R600 Localized edema: Secondary | ICD-10-CM | POA: Diagnosis not present

## 2020-08-31 DIAGNOSIS — K219 Gastro-esophageal reflux disease without esophagitis: Secondary | ICD-10-CM

## 2020-08-31 DIAGNOSIS — I10 Essential (primary) hypertension: Secondary | ICD-10-CM | POA: Diagnosis not present

## 2020-08-31 DIAGNOSIS — G894 Chronic pain syndrome: Secondary | ICD-10-CM

## 2020-08-31 DIAGNOSIS — E782 Mixed hyperlipidemia: Secondary | ICD-10-CM | POA: Diagnosis not present

## 2020-08-31 DIAGNOSIS — J432 Centrilobular emphysema: Secondary | ICD-10-CM | POA: Diagnosis not present

## 2020-08-31 DIAGNOSIS — M159 Polyosteoarthritis, unspecified: Secondary | ICD-10-CM

## 2020-08-31 DIAGNOSIS — Z720 Tobacco use: Secondary | ICD-10-CM | POA: Diagnosis not present

## 2020-08-31 DIAGNOSIS — F330 Major depressive disorder, recurrent, mild: Secondary | ICD-10-CM | POA: Insufficient documentation

## 2020-08-31 DIAGNOSIS — N401 Enlarged prostate with lower urinary tract symptoms: Secondary | ICD-10-CM

## 2020-08-31 DIAGNOSIS — M150 Primary generalized (osteo)arthritis: Secondary | ICD-10-CM

## 2020-08-31 MED ORDER — PRAVASTATIN SODIUM 40 MG PO TABS: 40.0000 mg | ORAL_TABLET | Freq: Every day | ORAL | 2 refills | Status: AC

## 2020-08-31 MED ORDER — ASPIRIN EC 81 MG PO TBEC: 81.0000 mg | DELAYED_RELEASE_TABLET | Freq: Every day | ORAL | Status: AC

## 2020-08-31 MED ORDER — ALBUTEROL SULFATE HFA 108 (90 BASE) MCG/ACT IN AERS: 2.0000 | INHALATION_SPRAY | RESPIRATORY_TRACT | 2 refills | Status: AC | PRN

## 2020-08-31 MED ORDER — METOPROLOL TARTRATE 25 MG PO TABS: 25.0000 mg | ORAL_TABLET | Freq: Two times a day (BID) | ORAL | 2 refills | Status: AC

## 2020-08-31 MED ORDER — GABAPENTIN 300 MG PO CAPS: 600.0000 mg | ORAL_CAPSULE | Freq: Three times a day (TID) | ORAL | 2 refills | Status: AC

## 2020-08-31 MED ORDER — OMEPRAZOLE 40 MG PO CPDR: 40.0000 mg | DELAYED_RELEASE_CAPSULE | Freq: Every day | ORAL | 2 refills | Status: AC

## 2020-08-31 MED ORDER — PAROXETINE HCL 10 MG PO TABS: 10.0000 mg | ORAL_TABLET | Freq: Every day | ORAL | 2 refills | Status: AC

## 2020-08-31 MED ORDER — TRELEGY ELLIPTA 100-62.5-25 MCG/INH IN AEPB: 1.0000 | INHALATION_SPRAY | Freq: Every day | RESPIRATORY_TRACT | 2 refills | Status: AC

## 2020-08-31 MED ORDER — METHOCARBAMOL 500 MG PO TABS: 500.0000 mg | ORAL_TABLET | Freq: Four times a day (QID) | ORAL | 2 refills | Status: AC | PRN

## 2020-08-31 MED ORDER — POTASSIUM CHLORIDE CRYS ER 20 MEQ PO TBCR: 20.0000 meq | EXTENDED_RELEASE_TABLET | Freq: Every day | ORAL | 2 refills | Status: DC

## 2020-08-31 MED ORDER — FUROSEMIDE 40 MG PO TABS
40.0000 mg | ORAL_TABLET | Freq: Every day | ORAL | 2 refills | Status: AC
Start: 2020-08-31 — End: ?

## 2020-08-31 MED ORDER — TAMSULOSIN HCL 0.4 MG PO CAPS: 0.4000 mg | ORAL_CAPSULE | Freq: Every day | ORAL | 2 refills | Status: AC

## 2020-08-31 NOTE — Progress Notes (Signed)
Subjective:    Patient ID: Aaron Harvey, male    DOB: [birth year], 65 y.o.   MRN: [id number]  Aaron Harvey is a 65 y.o. male presenting on 08/31/2020 for Establish Care and Back Pain  Previous PCP Franco Nones FNP  HPI  COPD Tobacco Abuse On Trelegy, will re order Not ready to quit smoking.  Chronic Pain Syndrome OA/DJD  Spinal Stenosis S/p Spinal Surgery Off opiates Low Back Pain [facility], has chronic Neck and Back pain. History of spinal cord compression. He has history Cervical spine surgery with hardware in place  Right Leg Tibial Fracture S/p Surgery repair On methocarbamol, Gabapentin, needs re order Request referral to Pain Specialist - but needs to get ins switched  History of Chronic Gout Rheumatology Dr Allena Katz [facility]) 09/23/20 Has colchicine PRN flares.  Major Depression recurrent On Paxil needs refill   Depression screen [facility] 2/9 08/31/2020  Decreased Interest 1  Down, Depressed, Hopeless 1  PHQ - 2 Score 2  Altered sleeping 2  Tired, decreased energy 2  Change in appetite 0  Feeling bad or failure about yourself  0  Trouble concentrating 1  Moving slowly or fidgety/restless 1  Suicidal thoughts 0  PHQ-9 Score 8  Difficult doing work/chores Somewhat difficult    Past Medical History:  Diagnosis Date   Alcohol addiction (HCC)    Anxiety    COPD (chronic obstructive pulmonary disease) (HCC)    Depression    Gout    Hyperlipidemia    Hypertension    Past Surgical History:  Procedure Laterality Date   BACK SURGERY     CARDIAC SURGERY     CERVICAL DISC SURGERY     TIBIA IM NAIL INSERTION Right 06/08/2020   Procedure: INTRAMEDULLARY (IM) NAIL TIBIAL;  Surgeon: Kennedy Bucker, MD;  Location: [facility] ORS;  Service: Orthopedics;  Laterality: Right;   Social History   Socioeconomic History   Marital status: Single    Spouse name: Not on file   Number of children: Not on file   Years of education: Not on file    Highest education level: Not on file  Occupational History   Not on file  Tobacco Use   Smoking status: Every Day    Packs/day: 1.00    Years: 60.00    Pack years: 60.00    Types: Cigarettes   Smokeless tobacco: Never  Vaping Use   Vaping Use: Never used  Substance and Sexual Activity   Alcohol use: Yes    Alcohol/week: 24.0 standard drinks    Types: 24 Cans of beer per week   Drug use: Yes    Types: Marijuana   Sexual activity: Not on file  Other Topics Concern   Not on file  Social History Narrative   Not on file   Social Determinants of Health   Financial Resource Strain: Not on file  Food Insecurity: Not on file  Transportation Needs: Not on file  Physical Activity: Not on file  Stress: Not on file  Social Connections: Not on file  Intimate Partner Violence: Not on file   History reviewed. No pertinent family history. Current Outpatient Medications on File Prior to Visit  Medication Sig   loratadine (CLARITIN) 10 MG tablet Take 1 tablet (10 mg total) by mouth daily.   No current facility-administered medications on file prior to visit.    Review of Systems Per HPI unless specifically indicated above      Objective:    BP Marland Kitchen)  120/59   Pulse 79   Ht  (1.727 m)   Wt 218 lb (98.9 kg)   SpO2 100%   BMI 33.15 kg/m   Wt Readings from Last 3 Encounters:  08/31/20 218 lb (98.9 kg)  06/05/20 195 lb 5.2 oz (88.6 kg)  09/19/18 180 lb (81.6 kg)    Physical Exam Vitals and nursing note reviewed.  Constitutional:      General: He is not in acute distress.    Appearance: Normal appearance. He is well-developed. He is not diaphoretic.     Comments: Well-appearing, comfortable, cooperative  HENT:     Head: Normocephalic and atraumatic.  Eyes:     General:        Right eye: No discharge.        Left eye: No discharge.     Conjunctiva/sclera: Conjunctivae normal.  Cardiovascular:     Rate and Rhythm: Normal rate.  Pulmonary:     Effort: Pulmonary  effort is normal.  Musculoskeletal:     Comments: In wheelchair Lower extremities with edema and some deformity  Skin:    General: Skin is warm and dry.     Findings: No erythema or rash.  Neurological:     Mental Status: He is alert and oriented to person, place, and time.  Psychiatric:        Mood and Affect: Mood normal.        Behavior: Behavior normal.        Thought Content: Thought content normal.     Comments: Well groomed, good eye contact, normal speech and thoughts   Results for orders placed or performed during the hospital encounter of 06/05/20  Blood culture (routine x 2)   Specimen: BLOOD  Result Value Ref Range   Specimen Description BLOOD BLOOD RIGHT FOREARM    Special Requests      BOTTLES DRAWN AEROBIC AND ANAEROBIC Blood Culture results may not be optimal due to an excessive volume of blood received in culture bottles   Culture      NO GROWTH 5 DAYS Performed at [facility], [address] [place], [place] [address]    Report Status 06/10/2020 FINAL   Blood culture (routine x 2)   Specimen: BLOOD  Result Value Ref Range   Specimen Description BLOOD LEFT ANTECUBITAL    Special Requests      BOTTLES DRAWN AEROBIC AND ANAEROBIC Blood Culture results may not be optimal due to an excessive volume of blood received in culture bottles   Culture      NO GROWTH 5 DAYS Performed at [facility], [address] [place], [place] [address]    Report Status 06/10/2020 FINAL   Resp Panel by RT-PCR (Flu A&B, Covid) Nasopharyngeal Swab   Specimen: Nasopharyngeal Swab; Nasopharyngeal(NP) swabs in vial transport medium  Result Value Ref Range   SARS Coronavirus 2 by RT PCR NEGATIVE NEGATIVE   Influenza A by PCR NEGATIVE NEGATIVE   Influenza B by PCR NEGATIVE NEGATIVE  Resp Panel by RT-PCR (Flu A&B, Covid) Nasopharyngeal Swab   Specimen: Nasopharyngeal Swab; Nasopharyngeal(NP) swabs in vial transport medium  Result Value Ref Range   SARS  Coronavirus 2 by RT PCR NEGATIVE NEGATIVE   Influenza A by PCR NEGATIVE NEGATIVE   Influenza B by PCR NEGATIVE NEGATIVE  CBC with Differential  Result Value Ref Range   WBC 10.5 4.0 - 10.5 K/uL   RBC 4.57 4.22 - 5.81 MIL/uL   Hemoglobin 13.4 13.0 - 17.0  g/dL   HCT 40.940.0 81.139.0 - 91.452.0 %   MCV 87.5 80.0 - 100.0 fL   MCH 29.3 26.0 - 34.0 pg   MCHC 33.5 30.0 - 36.0 g/dL   RDW 78.216.9 (H) 95.611.5 - 21.315.5 %   Platelets 417 (H) 150 - 400 K/uL   nRBC 0.0 0.0 - 0.2 %   Neutrophils Relative % 81 %   Neutro Abs 8.5 (H) 1.7 - 7.7 K/uL   Lymphocytes Relative 11 %   Lymphs Abs 1.1 0.7 - 4.0 K/uL   Monocytes Relative 7 %   Monocytes Absolute 0.8 0.1 - 1.0 K/uL   Eosinophils Relative 0 %   Eosinophils Absolute 0.0 0.0 - 0.5 K/uL   Basophils Relative 0 %   Basophils Absolute 0.0 0.0 - 0.1 K/uL   Immature Granulocytes 1 %   Abs Immature Granulocytes 0.13 (H) 0.00 - 0.07 K/uL  Comprehensive metabolic panel  Result Value Ref Range   Sodium 131 (L) 135 - 145 mmol/L   Potassium 4.7 3.5 - 5.1 mmol/L   Chloride 94 (L) 98 - 111 mmol/L   CO2 25 22 - 32 mmol/L   Glucose, Bld 120 (H) 70 - 99 mg/dL   BUN 12 8 - 23 mg/dL   Creatinine, Ser 0.860.87 0.61 - 1.24 mg/dL   Calcium 8.4 (L) 8.9 - 10.3 mg/dL   Total Protein 7.1 6.5 - 8.1 g/dL   Albumin 3.0 (L) 3.5 - 5.0 g/dL   AST 26 15 - 41 U/L   ALT 15 0 - 44 U/L   Alkaline Phosphatase 78 38 - 126 U/L   Total Bilirubin 1.2 0.3 - 1.2 mg/dL   GFR, Estimated >57>60 >84>60 mL/min   Anion gap 12 5 - 15  Lactic acid, plasma  Result Value Ref Range   Lactic Acid, Venous 1.3 0.5 - 1.9 mmol/L  Ethanol  Result Value Ref Range   Alcohol, Ethyl (B) <10 <10 mg/dL  Blood gas, venous  Result Value Ref Range   pH, Ven 7.43 7.250 - 7.430   pCO2, Ven 48 44.0 - 60.0 mmHg   pO2, Ven 67.0 (H) 32.0 - 45.0 mmHg   Bicarbonate 31.9 (H) 20.0 - 28.0 mmol/L   Acid-Base Excess 6.4 (H) 0.0 - 2.0 mmol/L   O2 Saturation 93.5 %   Patient temperature 37.0    Collection site LINE    Sample type  VENOUS   HIV Antibody (routine testing w rflx)  Result Value Ref Range   HIV Screen 4th Generation wRfx Non Reactive Non Reactive  CBC  Result Value Ref Range   WBC 11.5 (H) 4.0 - 10.5 K/uL   RBC 4.38 4.22 - 5.81 MIL/uL   Hemoglobin 12.7 (L) 13.0 - 17.0 g/dL   HCT 69.638.8 (L) 29.539.0 - 28.452.0 %   MCV 88.6 80.0 - 100.0 fL   MCH 29.0 26.0 - 34.0 pg   MCHC 32.7 30.0 - 36.0 g/dL   RDW 13.216.9 (H) 44.011.5 - 10.215.5 %   Platelets 389 150 - 400 K/uL   nRBC 0.0 0.0 - 0.2 %  Creatinine, serum  Result Value Ref Range   Creatinine, Ser 0.89 0.61 - 1.24 mg/dL   GFR, Estimated >72>60 >53>60 mL/min  Lactic acid, plasma  Result Value Ref Range   Lactic Acid, Venous 1.7 0.5 - 1.9 mmol/L  Basic metabolic panel  Result Value Ref Range   Sodium 131 (L) 135 - 145 mmol/L   Potassium 4.4 3.5 - 5.1 mmol/L   Chloride 96 (  L) 98 - 111 mmol/L   CO2 26 22 - 32 mmol/L   Glucose, Bld 152 (H) 70 - 99 mg/dL   BUN 13 8 - 23 mg/dL   Creatinine, Ser 1.61 0.61 - 1.24 mg/dL   Calcium 8.3 (L) 8.9 - 10.3 mg/dL   GFR, Estimated >09 >60 mL/min   Anion gap 9 5 - 15  Magnesium  Result Value Ref Range   Magnesium 1.9 1.7 - 2.4 mg/dL  Phosphorus  Result Value Ref Range   Phosphorus 3.5 2.5 - 4.6 mg/dL  Osmolality  Result Value Ref Range   Osmolality 282 275 - 295 mOsm/kg  Brain natriuretic peptide  Result Value Ref Range   B Natriuretic Peptide 525.0 (H) 0.0 - 100.0 pg/mL  Basic metabolic panel  Result Value Ref Range   Sodium 137 135 - 145 mmol/L   Potassium 4.3 3.5 - 5.1 mmol/L   Chloride 101 98 - 111 mmol/L   CO2 28 22 - 32 mmol/L   Glucose, Bld 105 (H) 70 - 99 mg/dL   BUN 17 8 - 23 mg/dL   Creatinine, Ser 4.54 0.61 - 1.24 mg/dL   Calcium 8.7 (L) 8.9 - 10.3 mg/dL   GFR, Estimated >09 >81 mL/min   Anion gap 8 5 - 15  CBC  Result Value Ref Range   WBC 12.4 (H) 4.0 - 10.5 K/uL   RBC 3.64 (L) 4.22 - 5.81 MIL/uL   Hemoglobin 10.6 (L) 13.0 - 17.0 g/dL   HCT 19.1 (L) 47.8 - 29.5 %   MCV 91.5 80.0 - 100.0 fL   MCH 29.1  26.0 - 34.0 pg   MCHC 31.8 30.0 - 36.0 g/dL   RDW 62.1 (H) 30.8 - 65.7 %   Platelets 384 150 - 400 K/uL   nRBC 0.0 0.0 - 0.2 %  Magnesium  Result Value Ref Range   Magnesium 2.0 1.7 - 2.4 mg/dL  Phosphorus  Result Value Ref Range   Phosphorus 3.5 2.5 - 4.6 mg/dL  D-dimer, quantitative  Result Value Ref Range   D-Dimer, Quant 1.43 (H) 0.00 - 0.50 ug/mL-FEU  Basic metabolic panel  Result Value Ref Range   Sodium 140 135 - 145 mmol/L   Potassium 3.7 3.5 - 5.1 mmol/L   Chloride 100 98 - 111 mmol/L   CO2 29 22 - 32 mmol/L   Glucose, Bld 98 70 - 99 mg/dL   BUN 17 8 - 23 mg/dL   Creatinine, Ser 8.46 0.61 - 1.24 mg/dL   Calcium 8.9 8.9 - 96.2 mg/dL   GFR, Estimated >95 >28 mL/min   Anion gap 11 5 - 15  CBC  Result Value Ref Range   WBC 12.1 (H) 4.0 - 10.5 K/uL   RBC 3.72 (L) 4.22 - 5.81 MIL/uL   Hemoglobin 11.1 (L) 13.0 - 17.0 g/dL   HCT 41.3 (L) 24.4 - 01.0 %   MCV 91.4 80.0 - 100.0 fL   MCH 29.8 26.0 - 34.0 pg   MCHC 32.6 30.0 - 36.0 g/dL   RDW 27.2 (H) 53.6 - 64.4 %   Platelets 389 150 - 400 K/uL   nRBC 0.0 0.0 - 0.2 %  Basic metabolic panel  Result Value Ref Range   Sodium 139 135 - 145 mmol/L   Potassium 3.6 3.5 - 5.1 mmol/L   Chloride 99 98 - 111 mmol/L   CO2 29 22 - 32 mmol/L   Glucose, Bld 101 (H) 70 - 99 mg/dL   BUN 13 8 -  23 mg/dL   Creatinine, Ser 1.94 0.61 - 1.24 mg/dL   Calcium 8.5 (L) 8.9 - 10.3 mg/dL   GFR, Estimated >17 >40 mL/min   Anion gap 11 5 - 15  CBC  Result Value Ref Range   WBC 12.8 (H) 4.0 - 10.5 K/uL   RBC 3.51 (L) 4.22 - 5.81 MIL/uL   Hemoglobin 10.3 (L) 13.0 - 17.0 g/dL   HCT 81.4 (L) 48.1 - 85.6 %   MCV 91.7 80.0 - 100.0 fL   MCH 29.3 26.0 - 34.0 pg   MCHC 32.0 30.0 - 36.0 g/dL   RDW 31.4 (H) 97.0 - 26.3 %   Platelets 364 150 - 400 K/uL   nRBC 0.0 0.0 - 0.2 %  Glucose, capillary  Result Value Ref Range   Glucose-Capillary 67 (L) 70 - 99 mg/dL  Glucose, capillary  Result Value Ref Range   Glucose-Capillary 104 (H) 70 - 99 mg/dL   Basic metabolic panel  Result Value Ref Range   Sodium 141 135 - 145 mmol/L   Potassium 3.7 3.5 - 5.1 mmol/L   Chloride 102 98 - 111 mmol/L   CO2 29 22 - 32 mmol/L   Glucose, Bld 91 70 - 99 mg/dL   BUN 10 8 - 23 mg/dL   Creatinine, Ser 7.85 0.61 - 1.24 mg/dL   Calcium 8.4 (L) 8.9 - 10.3 mg/dL   GFR, Estimated >88 >50 mL/min   Anion gap 10 5 - 15  CBC  Result Value Ref Range   WBC 10.3 4.0 - 10.5 K/uL   RBC 3.33 (L) 4.22 - 5.81 MIL/uL   Hemoglobin 9.7 (L) 13.0 - 17.0 g/dL   HCT 27.7 (L) 41.2 - 87.8 %   MCV 93.4 80.0 - 100.0 fL   MCH 29.1 26.0 - 34.0 pg   MCHC 31.2 30.0 - 36.0 g/dL   RDW 67.6 (H) 72.0 - 94.7 %   Platelets 338 150 - 400 K/uL   nRBC 0.0 0.0 - 0.2 %  Basic metabolic panel  Result Value Ref Range   Sodium 141 135 - 145 mmol/L   Potassium 3.6 3.5 - 5.1 mmol/L   Chloride 104 98 - 111 mmol/L   CO2 27 22 - 32 mmol/L   Glucose, Bld 96 70 - 99 mg/dL   BUN 10 8 - 23 mg/dL   Creatinine, Ser 0.96 0.61 - 1.24 mg/dL   Calcium 8.3 (L) 8.9 - 10.3 mg/dL   GFR, Estimated >28 >36 mL/min   Anion gap 10 5 - 15  CBC  Result Value Ref Range   WBC 9.5 4.0 - 10.5 K/uL   RBC 3.22 (L) 4.22 - 5.81 MIL/uL   Hemoglobin 9.3 (L) 13.0 - 17.0 g/dL   HCT 62.9 (L) 47.6 - 54.6 %   MCV 93.2 80.0 - 100.0 fL   MCH 28.9 26.0 - 34.0 pg   MCHC 31.0 30.0 - 36.0 g/dL   RDW 50.3 (H) 54.6 - 56.8 %   Platelets 332 150 - 400 K/uL   nRBC 0.0 0.0 - 0.2 %  ECHOCARDIOGRAM COMPLETE  Result Value Ref Range   Weight 3,125.24 oz   Height 68 in   BP 112/66 mmHg   Ao pk vel 1.29 m/s   AV Area VTI 2.46 cm2   AR max vel 2.26 cm2   AV Mean grad 3.0 mmHg   AV Peak grad 6.7 mmHg   S' Lateral 2.70 cm   AV Area mean vel 2.35 cm2  Area-P 1/2 2.60 cm2   MV VTI 1.91 cm2      Assessment & Plan:   Problem List Items Addressed This Visit     Tobacco abuse   Primary osteoarthritis involving multiple joints   Relevant Medications   gabapentin (NEURONTIN) 300 MG capsule   aspirin EC 81 MG tablet    methocarbamol (ROBAXIN) 500 MG tablet   Gastroesophageal reflux disease without esophagitis   Relevant Medications   omeprazole (PRILOSEC) 40 MG capsule   Depression, major, recurrent, mild (HCC)   Relevant Medications   PARoxetine (PAXIL) 10 MG tablet   Chronic pain syndrome - Primary   Relevant Medications   gabapentin (NEURONTIN) 300 MG capsule   aspirin EC 81 MG tablet   PARoxetine (PAXIL) 10 MG tablet   methocarbamol (ROBAXIN) 500 MG tablet   Centrilobular emphysema (HCC)   Relevant Medications   albuterol (VENTOLIN HFA) 108 (90 Base) MCG/ACT inhaler   TRELEGY ELLIPTA 100-62.5-25 MCG/INH AEPB   Bilateral lower extremity edema   Relevant Medications   furosemide (LASIX) 40 MG tablet   Other Visit Diagnoses     Encounter to establish care with new doctor       Mixed hyperlipidemia       Relevant Medications   aspirin EC 81 MG tablet   furosemide (LASIX) 40 MG tablet   pravastatin (PRAVACHOL) 40 MG tablet   metoprolol tartrate (LOPRESSOR) 25 MG tablet   Essential hypertension       Relevant Medications   aspirin EC 81 MG tablet   furosemide (LASIX) 40 MG tablet   potassium chloride SA (KLOR-CON) 20 MEQ tablet   pravastatin (PRAVACHOL) 40 MG tablet   metoprolol tartrate (LOPRESSOR) 25 MG tablet   Benign prostatic hyperplasia with lower urinary tract symptoms, symptom details unspecified       Relevant Medications   tamsulosin (FLOMAX) 0.4 MG CAPS capsule       Review outside records prior PCP / Specialists.  Will renew his previous medications today, med rec completed. Reviewed PDMP  Chronic Pain Syndrome Multiple pain generators, OA/DJD, spinal stenosis, cord compression History of prior fracture / repair Followed by Orthopedics and Spine specialist/neurosurgery in past. Prior C spine surgery  He has been managed on opiates in the past. Now off for >1 yr +, has been given rx by orthopedic but not long term Today we discussed that chronic pain management with  opiate medication is not primary goal of our treatment plan and I cannot do that as a new patient evaluation today. Goal to restart previous medications and try to improve his pain and function. When he is ready and has insurance / info switched to our office they can notify us and we can place referral to Pain Clinic.  GERD Re order Omeprazole  Major Depression recurrent  Anxiety Re order Paxil   Meds ordered this encounter  Medications   gabapentin (NEURONTIN) 300 MG capsule    Sig: Take 2 capsules (600 mg total) by mouth 3 (three) times daily.    Dispense:  180 capsule    Refill:  2   omeprazole (PRILOSEC) 40 MG capsule    Sig: Take 1 capsule (40 mg total) by mouth daily before breakfast.    Dispense:  30 capsule    Refill:  2   aspirin EC 81 MG tablet    Sig: Take 1 tablet (81 mg total) by mouth daily.   PARoxetine (PAXIL) 10 MG tablet    Sig: Take 1 tablet (10  mg total) by mouth daily.    Dispense:  30 tablet    Refill:  2   furosemide (LASIX) 40 MG tablet    Sig: Take 1 tablet (40 mg total) by mouth daily.    Dispense:  30 tablet    Refill:  2   potassium chloride SA (KLOR-CON) 20 MEQ tablet    Sig: Take 1 tablet (20 mEq total) by mouth daily.    Dispense:  30 tablet    Refill:  2   pravastatin (PRAVACHOL) 40 MG tablet    Sig: Take 1 tablet (40 mg total) by mouth at bedtime.    Dispense:  30 tablet    Refill:  2   albuterol (VENTOLIN HFA) 108 (90 Base) MCG/ACT inhaler    Sig: Inhale 2 puffs into the lungs every 4 (four) hours as needed. Reported on 09/15/2015    Dispense:  8 g    Refill:  2   TRELEGY ELLIPTA 100-62.5-25 MCG/INH AEPB    Sig: Inhale 1 puff into the lungs daily.    Dispense:  60 each    Refill:  2   metoprolol tartrate (LOPRESSOR) 25 MG tablet    Sig: Take 1 tablet (25 mg total) by mouth 2 (two) times daily.    Dispense:  60 tablet    Refill:  2   methocarbamol (ROBAXIN) 500 MG tablet    Sig: Take 1 tablet (500 mg total) by mouth every 6 (six)  hours as needed for muscle spasms.    Dispense:  120 tablet    Refill:  2   tamsulosin (FLOMAX) 0.4 MG CAPS capsule    Sig: Take 1 capsule (0.4 mg total) by mouth daily after supper.    Dispense:  30 capsule    Refill:  2     Follow up plan: Return in about 6 weeks (around 10/12/2020) for 6 week follow-up for pain, COPD, med refills.  Saralyn Pilar, DO [facility] 08/31/2020, 10:50 AM

## 2020-08-31 NOTE — Patient Instructions (Addendum)
Thank you for coming to the office today.  Refilled all meds 30 day with 2 refills  Once insurance changed over, we can refer to Pain Clinic.  [facility] Address: [address] [place], [place] [address] Phone: [phone]  Dr Ronita Hipps  [facility] [address] [place], [place] [address] Ph: [phone]  [facility] Kathryne Sharper Ph: [phone]   Please schedule a Follow-up Appointment to: Return in about 6 weeks (around 10/12/2020) for 6 week follow-up for pain, COPD, med refills.  If you have any other questions or concerns, please feel free to call the office or send a message through MyChart. You may also schedule an earlier appointment if necessary.  Additionally, you may be receiving a survey about your experience at our office within a few days to 1 week by e-mail or mail. We value your feedback.  Saralyn Pilar, DO [facility], [place]

## 2020-09-04 ENCOUNTER — Inpatient Hospital Stay
Admission: EM | Admit: 2020-09-04 | Discharge: 2020-09-10 | DRG: 291 | Disposition: A | Discharge status: Skilled Nursing Facility | Payer: Medicare Other | Attending: Hospitalist | Admitting: Hospitalist

## 2020-09-04 ENCOUNTER — Emergency Department: Payer: Medicare Other

## 2020-09-04 ENCOUNTER — Encounter: Payer: Self-pay | Admitting: Emergency Medicine

## 2020-09-04 ENCOUNTER — Other Ambulatory Visit: Payer: Self-pay

## 2020-09-04 DIAGNOSIS — G894 Chronic pain syndrome: Secondary | ICD-10-CM | POA: Diagnosis not present

## 2020-09-04 DIAGNOSIS — D519 Vitamin B12 deficiency anemia, unspecified: Secondary | ICD-10-CM | POA: Diagnosis not present

## 2020-09-04 DIAGNOSIS — I5033 Acute on chronic diastolic (congestive) heart failure: Secondary | ICD-10-CM | POA: Diagnosis present

## 2020-09-04 DIAGNOSIS — W19XXXA Unspecified fall, initial encounter: Secondary | ICD-10-CM | POA: Diagnosis present

## 2020-09-04 DIAGNOSIS — M2550 Pain in unspecified joint: Secondary | ICD-10-CM | POA: Diagnosis not present

## 2020-09-04 DIAGNOSIS — F10920 Alcohol use, unspecified with intoxication, uncomplicated: Secondary | ICD-10-CM | POA: Diagnosis not present

## 2020-09-04 DIAGNOSIS — E785 Hyperlipidemia, unspecified: Secondary | ICD-10-CM | POA: Diagnosis not present

## 2020-09-04 DIAGNOSIS — R270 Ataxia, unspecified: Secondary | ICD-10-CM | POA: Diagnosis not present

## 2020-09-04 DIAGNOSIS — I10 Essential (primary) hypertension: Secondary | ICD-10-CM | POA: Diagnosis present

## 2020-09-04 DIAGNOSIS — R531 Weakness: Secondary | ICD-10-CM

## 2020-09-04 DIAGNOSIS — Z743 Need for continuous supervision: Secondary | ICD-10-CM | POA: Diagnosis not present

## 2020-09-04 DIAGNOSIS — J441 Chronic obstructive pulmonary disease with (acute) exacerbation: Secondary | ICD-10-CM | POA: Diagnosis present

## 2020-09-04 DIAGNOSIS — I4891 Unspecified atrial fibrillation: Secondary | ICD-10-CM | POA: Diagnosis not present

## 2020-09-04 DIAGNOSIS — E871 Hypo-osmolality and hyponatremia: Secondary | ICD-10-CM | POA: Diagnosis not present

## 2020-09-04 DIAGNOSIS — K219 Gastro-esophageal reflux disease without esophagitis: Secondary | ICD-10-CM | POA: Diagnosis present

## 2020-09-04 DIAGNOSIS — Y908 Blood alcohol level of 240 mg/100 ml or more: Secondary | ICD-10-CM | POA: Diagnosis present

## 2020-09-04 DIAGNOSIS — D509 Iron deficiency anemia, unspecified: Secondary | ICD-10-CM | POA: Diagnosis not present

## 2020-09-04 DIAGNOSIS — I480 Paroxysmal atrial fibrillation: Secondary | ICD-10-CM | POA: Diagnosis present

## 2020-09-04 DIAGNOSIS — J9621 Acute and chronic respiratory failure with hypoxia: Secondary | ICD-10-CM | POA: Diagnosis present

## 2020-09-04 DIAGNOSIS — R0602 Shortness of breath: Secondary | ICD-10-CM | POA: Diagnosis present

## 2020-09-04 DIAGNOSIS — F10229 Alcohol dependence with intoxication, unspecified: Secondary | ICD-10-CM | POA: Diagnosis present

## 2020-09-04 DIAGNOSIS — Z7982 Long term (current) use of aspirin: Secondary | ICD-10-CM | POA: Diagnosis not present

## 2020-09-04 DIAGNOSIS — M7989 Other specified soft tissue disorders: Secondary | ICD-10-CM | POA: Diagnosis present

## 2020-09-04 DIAGNOSIS — J690 Pneumonitis due to inhalation of food and vomit: Secondary | ICD-10-CM | POA: Diagnosis not present

## 2020-09-04 DIAGNOSIS — Z7951 Long term (current) use of inhaled steroids: Secondary | ICD-10-CM | POA: Diagnosis not present

## 2020-09-04 DIAGNOSIS — I248 Other forms of acute ischemic heart disease: Secondary | ICD-10-CM | POA: Diagnosis present

## 2020-09-04 DIAGNOSIS — F1010 Alcohol abuse, uncomplicated: Secondary | ICD-10-CM

## 2020-09-04 DIAGNOSIS — R339 Retention of urine, unspecified: Secondary | ICD-10-CM | POA: Diagnosis not present

## 2020-09-04 DIAGNOSIS — R778 Other specified abnormalities of plasma proteins: Secondary | ICD-10-CM | POA: Diagnosis not present

## 2020-09-04 DIAGNOSIS — M25561 Pain in right knee: Secondary | ICD-10-CM

## 2020-09-04 DIAGNOSIS — J189 Pneumonia, unspecified organism: Secondary | ICD-10-CM | POA: Diagnosis present

## 2020-09-04 DIAGNOSIS — Z9981 Dependence on supplemental oxygen: Secondary | ICD-10-CM | POA: Diagnosis not present

## 2020-09-04 DIAGNOSIS — F17210 Nicotine dependence, cigarettes, uncomplicated: Secondary | ICD-10-CM | POA: Diagnosis not present

## 2020-09-04 DIAGNOSIS — R2681 Unsteadiness on feet: Secondary | ICD-10-CM | POA: Diagnosis not present

## 2020-09-04 DIAGNOSIS — Z79899 Other long term (current) drug therapy: Secondary | ICD-10-CM

## 2020-09-04 DIAGNOSIS — J9611 Chronic respiratory failure with hypoxia: Secondary | ICD-10-CM | POA: Diagnosis not present

## 2020-09-04 DIAGNOSIS — M6281 Muscle weakness (generalized): Secondary | ICD-10-CM | POA: Diagnosis not present

## 2020-09-04 DIAGNOSIS — R600 Localized edema: Secondary | ICD-10-CM | POA: Diagnosis not present

## 2020-09-04 DIAGNOSIS — A419 Sepsis, unspecified organism: Secondary | ICD-10-CM | POA: Diagnosis present

## 2020-09-04 DIAGNOSIS — R338 Other retention of urine: Secondary | ICD-10-CM | POA: Diagnosis not present

## 2020-09-04 DIAGNOSIS — I959 Hypotension, unspecified: Secondary | ICD-10-CM | POA: Diagnosis not present

## 2020-09-04 DIAGNOSIS — Z7401 Bed confinement status: Secondary | ICD-10-CM | POA: Diagnosis not present

## 2020-09-04 DIAGNOSIS — R000 Tachycardia, unspecified: Secondary | ICD-10-CM | POA: Diagnosis not present

## 2020-09-04 DIAGNOSIS — Z720 Tobacco use: Secondary | ICD-10-CM | POA: Diagnosis present

## 2020-09-04 DIAGNOSIS — I110 Hypertensive heart disease with heart failure: Principal | ICD-10-CM | POA: Diagnosis present

## 2020-09-04 DIAGNOSIS — F10929 Alcohol use, unspecified with intoxication, unspecified: Secondary | ICD-10-CM | POA: Diagnosis present

## 2020-09-04 DIAGNOSIS — S0990XA Unspecified injury of head, initial encounter: Secondary | ICD-10-CM | POA: Diagnosis not present

## 2020-09-04 DIAGNOSIS — R5381 Other malaise: Secondary | ICD-10-CM | POA: Diagnosis not present

## 2020-09-04 DIAGNOSIS — M6282 Rhabdomyolysis: Secondary | ICD-10-CM | POA: Diagnosis present

## 2020-09-04 DIAGNOSIS — Z20822 Contact with and (suspected) exposure to covid-19: Secondary | ICD-10-CM | POA: Diagnosis not present

## 2020-09-04 DIAGNOSIS — D649 Anemia, unspecified: Secondary | ICD-10-CM | POA: Diagnosis not present

## 2020-09-04 DIAGNOSIS — J449 Chronic obstructive pulmonary disease, unspecified: Secondary | ICD-10-CM | POA: Diagnosis not present

## 2020-09-04 DIAGNOSIS — M25562 Pain in left knee: Secondary | ICD-10-CM

## 2020-09-04 DIAGNOSIS — I5020 Unspecified systolic (congestive) heart failure: Secondary | ICD-10-CM | POA: Diagnosis not present

## 2020-09-04 DIAGNOSIS — S82301D Unspecified fracture of lower end of right tibia, subsequent encounter for closed fracture with routine healing: Secondary | ICD-10-CM | POA: Diagnosis not present

## 2020-09-04 DIAGNOSIS — R6889 Other general symptoms and signs: Secondary | ICD-10-CM | POA: Diagnosis not present

## 2020-09-04 DIAGNOSIS — D72829 Elevated white blood cell count, unspecified: Secondary | ICD-10-CM

## 2020-09-04 LAB — CBC
HCT: 31.3 % — ABNORMAL LOW (ref 39.0–52.0)
Hemoglobin: 9.7 g/dL — ABNORMAL LOW (ref 13.0–17.0)
MCH: 24.7 pg — ABNORMAL LOW (ref 26.0–34.0)
MCHC: 31 g/dL (ref 30.0–36.0)
MCV: 79.6 fL — ABNORMAL LOW (ref 80.0–100.0)
Platelets: 286 10*3/uL (ref 150–400)
RBC: 3.93 MIL/uL — ABNORMAL LOW (ref 4.22–5.81)
RDW: 16.2 % — ABNORMAL HIGH (ref 11.5–15.5)
WBC: 6.3 10*3/uL (ref 4.0–10.5)
nRBC: 0 % (ref 0.0–0.2)

## 2020-09-04 LAB — URINALYSIS, COMPLETE (UACMP) WITH MICROSCOPIC
Bacteria, UA: NONE SEEN
Bilirubin Urine: NEGATIVE
Glucose, UA: NEGATIVE mg/dL
Hgb urine dipstick: NEGATIVE
Ketones, ur: NEGATIVE mg/dL
Leukocytes,Ua: NEGATIVE
Nitrite: NEGATIVE
Protein, ur: NEGATIVE mg/dL
Specific Gravity, Urine: 1.004 — ABNORMAL LOW (ref 1.005–1.030)
Squamous Epithelial / HPF: NONE SEEN (ref 0–5)
pH: 6 (ref 5.0–8.0)

## 2020-09-04 LAB — COMPREHENSIVE METABOLIC PANEL
ALT: 10 U/L (ref 0–44)
AST: 20 U/L (ref 15–41)
Albumin: 3.4 g/dL — ABNORMAL LOW (ref 3.5–5.0)
Alkaline Phosphatase: 54 U/L (ref 38–126)
Anion gap: 8 (ref 5–15)
BUN: 7 mg/dL — ABNORMAL LOW (ref 8–23)
CO2: 25 mmol/L (ref 22–32)
Calcium: 8.3 mg/dL — ABNORMAL LOW (ref 8.9–10.3)
Chloride: 96 mmol/L — ABNORMAL LOW (ref 98–111)
Creatinine, Ser: 0.68 mg/dL (ref 0.61–1.24)
GFR, Estimated: >60 mL/min (ref >60)
Glucose, Bld: 94 mg/dL (ref 70–99)
Potassium: 4.1 mmol/L (ref 3.5–5.1)
Sodium: 129 mmol/L — ABNORMAL LOW (ref 135–145)
Total Bilirubin: 0.5 mg/dL (ref 0.3–1.2)
Total Protein: 6.9 g/dL (ref 6.5–8.1)

## 2020-09-04 LAB — RESP PANEL BY RT-PCR (FLU A&B, COVID) ARPGX2
Influenza A by PCR: NEGATIVE
Influenza B by PCR: NEGATIVE
SARS Coronavirus 2 by RT PCR: NEGATIVE

## 2020-09-04 LAB — TROPONIN I (HIGH SENSITIVITY)
Troponin I (High Sensitivity): 59 ng/L — ABNORMAL HIGH (ref <18)
Troponin I (High Sensitivity): 63 ng/L — ABNORMAL HIGH (ref <18)

## 2020-09-04 LAB — CK: Total CK: 629 U/L — ABNORMAL HIGH (ref 49–397)

## 2020-09-04 LAB — ETHANOL: Alcohol, Ethyl (B): 254 mg/dL — ABNORMAL HIGH (ref <10)

## 2020-09-04 MED ORDER — ALBUTEROL SULFATE (2.5 MG/3ML) 0.083% IN NEBU: 3.0000 mL | INHALATION_SOLUTION | RESPIRATORY_TRACT | Status: DC | PRN

## 2020-09-04 MED ORDER — FLUTICASONE-UMECLIDIN-VILANT 100-62.5-25 MCG/INH IN AEPB: 1.0000 | INHALATION_SPRAY | Freq: Every day | RESPIRATORY_TRACT | Status: DC

## 2020-09-04 MED ORDER — FLUTICASONE FUROATE-VILANTEROL 100-25 MCG/INH IN AEPB
1.0000 | INHALATION_SPRAY | Freq: Every day | RESPIRATORY_TRACT | Status: DC
  Administered 2020-09-05: 1 via RESPIRATORY_TRACT
  Filled 2020-09-04: qty 28

## 2020-09-04 MED ORDER — GABAPENTIN 300 MG PO CAPS
600.0000 mg | ORAL_CAPSULE | Freq: Three times a day (TID) | ORAL | Status: DC
  Administered 2020-09-04 – 2020-09-10 (×18): 600 mg via ORAL
  Filled 2020-09-04 (×13): qty 2
  Filled 2020-09-04: qty 6
  Filled 2020-09-04 (×4): qty 2

## 2020-09-04 MED ORDER — METOPROLOL TARTRATE 25 MG PO TABS
25.0000 mg | ORAL_TABLET | Freq: Two times a day (BID) | ORAL | Status: DC
  Administered 2020-09-04 – 2020-09-05 (×2): 25 mg via ORAL
  Filled 2020-09-04 (×3): qty 1

## 2020-09-04 MED ORDER — LORATADINE 10 MG PO TABS
10.0000 mg | ORAL_TABLET | Freq: Every day | ORAL | Status: DC
  Administered 2020-09-04 – 2020-09-10 (×7): 10 mg via ORAL
  Filled 2020-09-04 (×7): qty 1

## 2020-09-04 MED ORDER — SODIUM CHLORIDE 0.9 % IV BOLUS
1000.0000 mL | Freq: Once | INTRAVENOUS | Status: AC
  Administered 2020-09-04: 1000 mL via INTRAVENOUS

## 2020-09-04 MED ORDER — ASPIRIN EC 81 MG PO TBEC
81.0000 mg | DELAYED_RELEASE_TABLET | Freq: Every day | ORAL | Status: DC
  Administered 2020-09-04 – 2020-09-10 (×6): 81 mg via ORAL
  Filled 2020-09-04 (×6): qty 1

## 2020-09-04 MED ORDER — UMECLIDINIUM BROMIDE 62.5 MCG/INH IN AEPB
1.0000 | INHALATION_SPRAY | Freq: Every day | RESPIRATORY_TRACT | Status: DC
  Administered 2020-09-05: 1 via RESPIRATORY_TRACT
  Filled 2020-09-04: qty 7

## 2020-09-04 MED ORDER — METHOCARBAMOL 500 MG PO TABS
500.0000 mg | ORAL_TABLET | Freq: Four times a day (QID) | ORAL | Status: DC | PRN
  Administered 2020-09-05 – 2020-09-10 (×4): 500 mg via ORAL
  Filled 2020-09-04 (×5): qty 1
  Filled 2020-09-04: qty 2
  Filled 2020-09-04 (×4): qty 1

## 2020-09-04 MED ORDER — PAROXETINE HCL 10 MG PO TABS
10.0000 mg | ORAL_TABLET | Freq: Every day | ORAL | Status: DC
  Administered 2020-09-04 – 2020-09-10 (×7): 10 mg via ORAL
  Filled 2020-09-04 (×7): qty 1

## 2020-09-04 MED ORDER — OXYCODONE-ACETAMINOPHEN 5-325 MG PO TABS
1.0000 | ORAL_TABLET | Freq: Once | ORAL | Status: AC
  Administered 2020-09-04: 1 via ORAL
  Filled 2020-09-04: qty 1

## 2020-09-04 MED ORDER — FUROSEMIDE 40 MG PO TABS
40.0000 mg | ORAL_TABLET | Freq: Every day | ORAL | Status: DC
  Administered 2020-09-04 – 2020-09-05 (×2): 40 mg via ORAL
  Filled 2020-09-04 (×2): qty 1

## 2020-09-04 NOTE — ED Notes (Signed)
Patient placed on 2L O2 due to oxygen levels 89%. MD made aware

## 2020-09-04 NOTE — ED Triage Notes (Signed)
Pt brought in by [facility] from home, family called out for frequently rolling out of bed, He does have ETOH  (at least a 5th of liquor)on board. EMS reported that house was unsanitary conditions. Pt lives with his nephew and his wife. Pt is contractured in lower extremities. He is covered in feces. Feet are red and swollen.

## 2020-09-04 NOTE — ED Notes (Signed)
Patient is resting comfortably. 

## 2020-09-04 NOTE — ED Notes (Signed)
Offered pt dinner tray, pt says he does not feel like eating now. Dinner tray placed at the bedside, call bell within reach. Aware to notify staff when he is ready to eat for repositioning and eating.

## 2020-09-04 NOTE — ED Notes (Signed)
Patient transported to CT 

## 2020-09-04 NOTE — ED Provider Notes (Signed)
[facility] Emergency Department Provider Note  Time seen: 3:21 PM  I have reviewed the triage vital signs and the nursing notes.   HISTORY  Chief Complaint Fall and Alcohol Intoxication   HPI Aaron Harvey is a 65 y.o. male with a past medical history of alcohol abuse, COPD, anxiety, hypertension, hyperlipidemia, presents to the emergency department for a fall/weakness/alcohol use.  According to EMS report the patient's nephew called as the patient fell yesterday has been largely unable to care for himself for the past few weeks, found to be in his own stool and urine.  Patient admits alcohol use but states none since last night.  Will typically drink 1/5 of liquor per day.  Patient states chronic back pain but denies any acute complaints.  Past Medical History:  Diagnosis Date   Alcohol addiction (HCC)    Anxiety    COPD (chronic obstructive pulmonary disease) (HCC)    Depression    Gout    Hyperlipidemia    Hypertension     Patient Active Problem List   Diagnosis Date Noted   Chronic pain syndrome 08/31/2020   Bilateral lower extremity edema 08/31/2020   Gastroesophageal reflux disease without esophagitis 08/31/2020   Depression, major, recurrent, mild (HCC) 08/31/2020   Primary osteoarthritis involving multiple joints 08/31/2020   Centrilobular emphysema (HCC) 08/31/2020   Tobacco abuse 08/31/2020   Alcohol consumption binge drinking 06/06/2020   COPD (chronic obstructive pulmonary disease) (HCC) 06/05/2020   Gout 06/05/2020   Closed displaced spiral fracture of shaft of right tibia 06/05/2020   Accidental fall 06/05/2020   Acute urinary retention 06/05/2020   Hypertension 12/21/2009    Past Surgical History:  Procedure Laterality Date   BACK SURGERY     CARDIAC SURGERY     CERVICAL DISC SURGERY     TIBIA IM NAIL INSERTION Right 06/08/2020   Procedure: INTRAMEDULLARY (IM) NAIL TIBIAL;  Surgeon: Kennedy Bucker, MD;  Location: [facility] ORS;   Service: Orthopedics;  Laterality: Right;    Prior to Admission medications   Medication Sig Start Date End Date Taking? Authorizing Provider  albuterol (VENTOLIN HFA) 108 (90 Base) MCG/ACT inhaler Inhale 2 puffs into the lungs every 4 (four) hours as needed. Reported on 09/15/2015 08/31/20   Smitty Cords, DO  aspirin EC 81 MG tablet Take 1 tablet (81 mg total) by mouth daily. 08/31/20   Karamalegos, Netta Neat, DO  furosemide (LASIX) 40 MG tablet Take 1 tablet (40 mg total) by mouth daily. 08/31/20   Karamalegos, Netta Neat, DO  gabapentin (NEURONTIN) 300 MG capsule Take 2 capsules (600 mg total) by mouth 3 (three) times daily. 08/31/20   Karamalegos, Netta Neat, DO  loratadine (CLARITIN) 10 MG tablet Take 1 tablet (10 mg total) by mouth daily. 06/12/20   Lynn Ito, MD  methocarbamol (ROBAXIN) 500 MG tablet Take 1 tablet (500 mg total) by mouth every 6 (six) hours as needed for muscle spasms. 08/31/20   Karamalegos, Netta Neat, DO  metoprolol tartrate (LOPRESSOR) 25 MG tablet Take 1 tablet (25 mg total) by mouth 2 (two) times daily. 08/31/20   Karamalegos, Netta Neat, DO  omeprazole (PRILOSEC) 40 MG capsule Take 1 capsule (40 mg total) by mouth daily before breakfast. 08/31/20   Althea Charon, Netta Neat, DO  PARoxetine (PAXIL) 10 MG tablet Take 1 tablet (10 mg total) by mouth daily. 08/31/20   Karamalegos, Netta Neat, DO  potassium chloride SA (KLOR-CON) 20 MEQ tablet Take 1 tablet (20 mEq total) by mouth  daily. 08/31/20   Smitty Cords, DO  pravastatin (PRAVACHOL) 40 MG tablet Take 1 tablet (40 mg total) by mouth at bedtime. 08/31/20   Karamalegos, Netta Neat, DO  tamsulosin (FLOMAX) 0.4 MG CAPS capsule Take 1 capsule (0.4 mg total) by mouth daily after supper. 08/31/20   Karamalegos, Alexander J, DO  TRELEGY ELLIPTA 100-62.5-25 MCG/INH AEPB Inhale 1 puff into the lungs daily. 08/31/20   Karamalegos, Netta Neat, DO    No Known Allergies  No family history on file.  Social  History Social History   Tobacco Use   Smoking status: Every Day    Packs/day: 1.00    Years: 60.00    Pack years: 60.00    Types: Cigarettes   Smokeless tobacco: Never  Vaping Use   Vaping Use: Never used  Substance Use Topics   Alcohol use: Yes    Alcohol/week: 24.0 standard drinks    Types: 24 Cans of beer per week   Drug use: Yes    Types: Marijuana    Review of Systems Constitutional: Negative for fever. Cardiovascular: Negative for chest pain. Respiratory: Negative for shortness of breath. Gastrointestinal: Negative for abdominal pain Musculoskeletal: Back pain, chronic Neurological: Negative for headache All other ROS negative  ____________________________________________   PHYSICAL EXAM:  VITAL SIGNS: ED Triage Vitals  Enc Vitals Group     BP 09/04/20 1512 125/81     Pulse Rate 09/04/20 1512 98     Resp 09/04/20 1512 20     Temp 09/04/20 1512 99 F (37.2 C)     Temp Source 09/04/20 1512 Oral     SpO2 09/04/20 1512 93 %     Weight 09/04/20 1513 218 lb (98.9 kg)     Height 09/04/20 1513 5\' 8"  (1.727 m)     Head Circumference --      Peak Flow --      Pain Score 09/04/20 1513 10     Pain Loc --      Pain Edu? --      Excl. in GC? --    Constitutional: Patient is awake and alert mildly slurred speech.  Lying in bed, covered in what appears to be dried stool. Eyes: Normal exam ENT      Head: Normocephalic and atraumatic.      Mouth/Throat: Somewhat dry appearing mucous membranes. Cardiovascular: Normal rate, regular rhythm.  Respiratory: Normal respiratory effort without tachypnea nor retractions. Breath sounds are clear Gastrointestinal: Soft and nontender. No distention.   Musculoskeletal: Chronically contracted lower extremities per patient.  1-2+ edema bilateral lower extremities, chronic per patient. Neurologic: Mildly slurred speech.  Normal language.  Somewhat somnolent. Skin:  Skin is warm, dry.  Appears to be covered in dried  stool. Psychiatric: Mood and affect are normal.  Somewhat somnolent.  ____________________________________________    EKG  EKG viewed and interpreted by myself shows sinus tachycardia 100 bpm with a narrow QRS, normal axis, normal intervals, nonspecific ST changes.  ____________________________________________    RADIOLOGY  CT head is negative. Chest x-ray does not appear to show any acute abnormality.  ____________________________________________   INITIAL IMPRESSION / ASSESSMENT AND PLAN / ED COURSE  Pertinent labs & imaging results that were available during my care of the patient were reviewed by me and considered in my medical decision making (see chart for details).   Patient presents to the emergency department with concerns over alcohol use, fall, prolonged stay on the floor, inability to care for himself.  We will check labs  including urinalysis, CK, obtain a CT scan of the head, chest x-ray, COVID swab, begin IV hydration while awaiting lab results.  Patient agreeable to plan of care.  Patient's work-up shows no significant abnormality warranting admission to the hospital.  CK is mildly elevated, patient receiving IV fluids.  Alcohol is elevated.  CT scan negative, chest x-ray appears negative as well.  Troponin is mildly elevated however on recheck it is largely unchanged.  Urinalysis is normal.  Mild hyponatremia, patient receiving IV fluids.  We will have the patient seen by social work as well as PT as it appears that they are unable to adequately care for the patient at home.  Aaron Harvey was evaluated in Emergency Department on 09/04/2020 for the symptoms described in the history of present illness. He was evaluated in the context of the global COVID-19 pandemic, which necessitated consideration that the patient might be at risk for infection with the SARS-CoV-2 virus that causes COVID-19. Institutional protocols and algorithms that pertain to the evaluation of patients  at risk for COVID-19 are in a state of rapid change based on information released by regulatory bodies including the CDC and federal and state organizations. These policies and algorithms were followed during the patient's care in the ED.  ____________________________________________   FINAL CLINICAL IMPRESSION(S) / ED DIAGNOSES  Fall Alcohol abuse Weakness   Minna Antis, MD 09/04/20 2016

## 2020-09-04 NOTE — ED Notes (Signed)
Patient's family member, Kendal Hymen (niece) phoned to check on the patient [phone]).

## 2020-09-05 DIAGNOSIS — R531 Weakness: Secondary | ICD-10-CM | POA: Diagnosis not present

## 2020-09-05 DIAGNOSIS — I959 Hypotension, unspecified: Secondary | ICD-10-CM | POA: Diagnosis not present

## 2020-09-05 MED ORDER — LORAZEPAM 2 MG/ML IJ SOLN
0.0000 mg | Freq: Four times a day (QID) | INTRAMUSCULAR | Status: AC
Start: 2020-09-05 — End: 2020-09-07
  Administered 2020-09-05: 2 mg via INTRAVENOUS
  Administered 2020-09-06: 1 mg via INTRAVENOUS
  Filled 2020-09-05 (×2): qty 1

## 2020-09-05 MED ORDER — THIAMINE HCL 100 MG/ML IJ SOLN: 100.0000 mg | Freq: Every day | INTRAMUSCULAR | Status: DC

## 2020-09-05 MED ORDER — LORAZEPAM 2 MG/ML IJ SOLN: 0.0000 mg | Freq: Two times a day (BID) | INTRAMUSCULAR | Status: AC

## 2020-09-05 MED ORDER — LORAZEPAM 2 MG PO TABS
0.0000 mg | ORAL_TABLET | Freq: Four times a day (QID) | ORAL | Status: AC
  Administered 2020-09-06: 1 mg via ORAL
  Filled 2020-09-05: qty 1

## 2020-09-05 MED ORDER — MORPHINE SULFATE (PF) 4 MG/ML IV SOLN
4.0000 mg | Freq: Once | INTRAVENOUS | Status: AC
  Administered 2020-09-05: 4 mg via INTRAVENOUS
  Filled 2020-09-05: qty 1

## 2020-09-05 MED ORDER — TAMSULOSIN HCL 0.4 MG PO CAPS
0.4000 mg | ORAL_CAPSULE | Freq: Every day | ORAL | Status: DC
  Administered 2020-09-05: 0.4 mg via ORAL
  Filled 2020-09-05: qty 1

## 2020-09-05 MED ORDER — LORAZEPAM 2 MG PO TABS: 0.0000 mg | ORAL_TABLET | Freq: Two times a day (BID) | ORAL | Status: AC

## 2020-09-05 MED ORDER — THIAMINE HCL 100 MG PO TABS
100.0000 mg | ORAL_TABLET | Freq: Every day | ORAL | Status: DC
  Administered 2020-09-06 – 2020-09-10 (×5): 100 mg via ORAL
  Filled 2020-09-05 (×5): qty 1

## 2020-09-05 NOTE — ED Notes (Signed)
>  on bladder scan, EDP made aware, order for foley.

## 2020-09-05 NOTE — Evaluation (Signed)
Physical Therapy Evaluation Patient Details Name: Aaron Harvey MRN: [id number] DOB: 01-02-1956 Today's Date: 09/05/2020   History of Present Illness  Patient is a 65 y.o. male with a past medical history of alcohol abuse, COPD, anxiety, hypertension, hyperlipidemia, presents to the emergency department for a fall/weakness/alcohol use, inability to care for himself.  Clinical Impression  Patient agreeable to PT. He reports he has not walked in years and uses a wheelchair at primary means of mobility.  Patient complains of chronic pain in BLE and low back.  Bilateral LE appear to have flexion contractures with heels touching buttocks while laying in bed. Patient does not tolerate PROM efforts for stretching due to pain. He requires physical assistance for bed mobility. Sp02 99% with activity, however patient appears to bear down and hold breath with activity. Cues provided for breathing techniques. Fair sitting balance demonstrated. Patient is generally deconditioned and fatigued with minimal activity. Recommend PT follow up to maximize independence and address functional limitations to decrease caregiver burden. SNF is recommended at discharge.     Follow Up Recommendations SNF    Equipment Recommendations  None recommended by PT    Recommendations for Other Services       Precautions / Restrictions Precautions Precautions: Fall Restrictions Weight Bearing Restrictions: No      Mobility  Bed Mobility Overal bed mobility: Needs Assistance Bed Mobility: Supine to Sit;Sit to Supine     Supine to sit: Mod assist Sit to supine: Mod assist   General bed mobility comments: assistance for BLE and occasional trunk support provided. verbal cues for technique    Transfers                 General transfer comment: unable to attempt due to generalized weakness and generalized pain. patient also with mild shortness of breath with exertion, Sp02 99%. verbal cues for breathing  techniques as patient with tendency to hold breath with activity  Ambulation/Gait                Stairs            Wheelchair Mobility    Modified Rankin (Stroke Patients Only)       Balance Overall balance assessment: Needs assistance Sitting-balance support: Feet supported Sitting balance-Leahy Scale: Fair                                       Pertinent Vitals/Pain Pain Assessment: Faces Faces Pain Scale: Hurts whole lot Pain Location: legs and lower back (chronic pain) Pain Descriptors / Indicators: Discomfort    Home Living Family/patient expects to be discharged to:: Private residence Living Arrangements: Other relatives Available Help at Discharge: Family Type of Home: Mobile home Home Access: Ramped entrance     Home Layout: One level Home Equipment: Wheelchair - manual;Bedside commode;Hospital bed      Prior Function Level of Independence: Independent with assistive device(s)         Comments: patient has not walked in "a couple years". wheelchair is primary means of mobility. uses UE and LE to propel the wheelchair independently and does a scooting transfer. patient gives himself a sponge bath without assistance from family. family cooks     Hand Dominance   Dominant Hand: Right    Extremity/Trunk Assessment   Upper Extremity Assessment Upper Extremity Assessment: Generalized weakness    Lower Extremity Assessment Lower Extremity Assessment: LLE deficits/detail;RLE deficits/detail  RLE Deficits / Details: hyperteonicity vs flexion contracture of hip and knee. heels are touching buttocks while laying in bed. pain with PROM efforts. redness and edema noted on top of feet RLE: Unable to fully assess due to pain LLE Deficits / Details: hyperteonicity vs flexion contracture of hip and knee. heels are touching buttocks while laying in bed. pain with PROM effort. redness and edema noted on top of feet LLE: Unable to fully  assess due to pain       Communication   Communication: No difficulties  Cognition Arousal/Alertness: Awake/alert Behavior During Therapy: WFL for tasks assessed/performed Overall Cognitive Status: Within Functional Limits for tasks assessed                                        General Comments General comments (skin integrity, edema, etc.): attempted repositioning/ROM of BLE. patient with pain with PROM efforts.    Exercises     Assessment/Plan    PT Assessment Patient needs continued PT services  PT Problem List Decreased strength;Decreased range of motion;Decreased activity tolerance;Decreased balance;Decreased mobility;Decreased safety awareness;Pain       PT Treatment Interventions DME instruction;Stair training;Therapeutic activities;Functional mobility training;Therapeutic exercise;Balance training;Neuromuscular re-education;Patient/family education;Wheelchair mobility training;Manual techniques;Modalities    PT Goals (Current goals can be found in the Care Plan section)  Acute Rehab PT Goals Patient Stated Goal: to be more comfortable PT Goal Formulation: With patient Time For Goal Achievement: 09/19/20 Potential to Achieve Goals: Fair    Frequency Min 2X/week   Barriers to discharge        Co-evaluation               AM-PAC PT "6 Clicks" Mobility  Outcome Measure Help needed turning from your back to your side while in a flat bed without using bedrails?: A Little Help needed moving from lying on your back to sitting on the side of a flat bed without using bedrails?: A Lot Help needed moving to and from a bed to a chair (including a wheelchair)?: A Lot Help needed standing up from a chair using your arms (e.g., wheelchair or bedside chair)?: Total Help needed to walk in hospital room?: Total Help needed climbing 3-5 steps with a railing? : Total 6 Click Score: 10    End of Session Equipment Utilized During Treatment:  Oxygen Activity Tolerance: Patient limited by fatigue;Patient limited by pain Patient left: in bed;with call bell/phone within reach;with bed alarm set Nurse Communication: Mobility status PT Visit Diagnosis: Unsteadiness on feet (R26.81);Muscle weakness (generalized) (M62.81)    Time: 6333-5456 PT Time Calculation (min) (ACUTE ONLY): 26 min   Charges:   PT Evaluation $PT Eval Moderate Complexity: 1 Mod PT Treatments $Therapeutic Activity: 8-22 mins        Donna Bernard, PT, MPT   Ina Homes 09/05/2020, 9:55 AM

## 2020-09-05 NOTE — ED Notes (Signed)
During medication administration, asked pt if he would like to speak to his niece. States, "I want to rest right now". Kendal Hymen (niece) called and made aware.

## 2020-09-05 NOTE — ED Provider Notes (Signed)
I got called in the room by patient's nurse because patient was complaining of being unable to urinate.  Patient told me that he felt like he needed to urinate but was unable to.  Bladder scan shows greater than 900 cc.  Foley catheter was placed.  Patient has had urinary retention in the past.  We will start patient on Flomax.  UA has been reviewed by me with no signs of UTI   Nita Sickle, MD 09/05/20 [phone]

## 2020-09-05 NOTE — ED Notes (Signed)
Provided ginger ale to drink.

## 2020-09-05 NOTE — ED Provider Notes (Signed)
Emergency Medicine Observation Re-evaluation Note  Aaron Harvey is a 65 y.o. male, seen on rounds today.  Pt initially presented to the ED for complaints of Fall and Alcohol Intoxication Currently, the patient is calm, complaining of generalized crampy leg pain bilaterally.Marland Kitchen  Physical Exam  BP (!) 149/88   Pulse 77   Temp 98.4 F (36.9 C) (Oral)   Resp 20   Ht 5\' 8"  (1.727 m)   Wt 98.9 kg   SpO2 95%   BMI 33.15 kg/m  Physical Exam General: Nontoxic, no distress Cardiac: Regular rate and rhythm Lungs: Clear to auscultation bilaterally, unlabored breathing Psych: Lucid GI: Had soft bowel movement in his diaper  ED Course / MDM  EKG:   I have reviewed the labs performed to date as well as medications administered while in observation.  Recent changes in the last 24 hours include none.  Plan  Current plan is for as needed pain management, continue home meds.  Will give morphine 4 mg IV for now.  Awaiting social work disposition.   , MD 09/05/20 [phone]

## 2020-09-05 NOTE — ED Notes (Signed)
Aaron Harvey (niece)  [phone]  ** Requesting a call when pt is awake **

## 2020-09-06 ENCOUNTER — Inpatient Hospital Stay: Payer: Medicare Other

## 2020-09-06 ENCOUNTER — Encounter: Payer: Self-pay | Admitting: Internal Medicine

## 2020-09-06 DIAGNOSIS — F10229 Alcohol dependence with intoxication, unspecified: Secondary | ICD-10-CM | POA: Diagnosis present

## 2020-09-06 DIAGNOSIS — R339 Retention of urine, unspecified: Secondary | ICD-10-CM | POA: Diagnosis present

## 2020-09-06 DIAGNOSIS — W19XXXA Unspecified fall, initial encounter: Secondary | ICD-10-CM | POA: Diagnosis present

## 2020-09-06 DIAGNOSIS — I5033 Acute on chronic diastolic (congestive) heart failure: Secondary | ICD-10-CM | POA: Diagnosis present

## 2020-09-06 DIAGNOSIS — E871 Hypo-osmolality and hyponatremia: Secondary | ICD-10-CM | POA: Diagnosis present

## 2020-09-06 DIAGNOSIS — A419 Sepsis, unspecified organism: Secondary | ICD-10-CM | POA: Diagnosis present

## 2020-09-06 DIAGNOSIS — M25562 Pain in left knee: Secondary | ICD-10-CM | POA: Diagnosis present

## 2020-09-06 DIAGNOSIS — D509 Iron deficiency anemia, unspecified: Secondary | ICD-10-CM | POA: Diagnosis present

## 2020-09-06 DIAGNOSIS — Z20822 Contact with and (suspected) exposure to covid-19: Secondary | ICD-10-CM | POA: Diagnosis present

## 2020-09-06 DIAGNOSIS — I959 Hypotension, unspecified: Secondary | ICD-10-CM | POA: Diagnosis present

## 2020-09-06 DIAGNOSIS — R531 Weakness: Secondary | ICD-10-CM | POA: Diagnosis not present

## 2020-09-06 DIAGNOSIS — F10920 Alcohol use, unspecified with intoxication, uncomplicated: Secondary | ICD-10-CM | POA: Diagnosis not present

## 2020-09-06 DIAGNOSIS — R778 Other specified abnormalities of plasma proteins: Secondary | ICD-10-CM

## 2020-09-06 DIAGNOSIS — F10929 Alcohol use, unspecified with intoxication, unspecified: Secondary | ICD-10-CM | POA: Diagnosis present

## 2020-09-06 DIAGNOSIS — R338 Other retention of urine: Secondary | ICD-10-CM | POA: Diagnosis not present

## 2020-09-06 DIAGNOSIS — Z720 Tobacco use: Secondary | ICD-10-CM

## 2020-09-06 DIAGNOSIS — J189 Pneumonia, unspecified organism: Secondary | ICD-10-CM

## 2020-09-06 DIAGNOSIS — D519 Vitamin B12 deficiency anemia, unspecified: Secondary | ICD-10-CM | POA: Diagnosis present

## 2020-09-06 DIAGNOSIS — M7989 Other specified soft tissue disorders: Secondary | ICD-10-CM | POA: Diagnosis present

## 2020-09-06 DIAGNOSIS — Y908 Blood alcohol level of 240 mg/100 ml or more: Secondary | ICD-10-CM | POA: Diagnosis present

## 2020-09-06 DIAGNOSIS — J441 Chronic obstructive pulmonary disease with (acute) exacerbation: Secondary | ICD-10-CM

## 2020-09-06 DIAGNOSIS — G894 Chronic pain syndrome: Secondary | ICD-10-CM

## 2020-09-06 DIAGNOSIS — M25561 Pain in right knee: Secondary | ICD-10-CM | POA: Diagnosis present

## 2020-09-06 DIAGNOSIS — I248 Other forms of acute ischemic heart disease: Secondary | ICD-10-CM | POA: Diagnosis present

## 2020-09-06 DIAGNOSIS — K219 Gastro-esophageal reflux disease without esophagitis: Secondary | ICD-10-CM

## 2020-09-06 DIAGNOSIS — M6282 Rhabdomyolysis: Secondary | ICD-10-CM | POA: Diagnosis present

## 2020-09-06 DIAGNOSIS — I110 Hypertensive heart disease with heart failure: Secondary | ICD-10-CM | POA: Diagnosis present

## 2020-09-06 DIAGNOSIS — S82301D Unspecified fracture of lower end of right tibia, subsequent encounter for closed fracture with routine healing: Secondary | ICD-10-CM | POA: Diagnosis not present

## 2020-09-06 DIAGNOSIS — R0602 Shortness of breath: Secondary | ICD-10-CM | POA: Diagnosis present

## 2020-09-06 DIAGNOSIS — R600 Localized edema: Secondary | ICD-10-CM | POA: Diagnosis not present

## 2020-09-06 DIAGNOSIS — Z9981 Dependence on supplemental oxygen: Secondary | ICD-10-CM | POA: Diagnosis not present

## 2020-09-06 DIAGNOSIS — E785 Hyperlipidemia, unspecified: Secondary | ICD-10-CM | POA: Diagnosis present

## 2020-09-06 DIAGNOSIS — R7989 Other specified abnormal findings of blood chemistry: Secondary | ICD-10-CM | POA: Diagnosis present

## 2020-09-06 DIAGNOSIS — Z7982 Long term (current) use of aspirin: Secondary | ICD-10-CM | POA: Diagnosis not present

## 2020-09-06 DIAGNOSIS — F17210 Nicotine dependence, cigarettes, uncomplicated: Secondary | ICD-10-CM | POA: Diagnosis present

## 2020-09-06 DIAGNOSIS — J690 Pneumonitis due to inhalation of food and vomit: Secondary | ICD-10-CM | POA: Diagnosis present

## 2020-09-06 DIAGNOSIS — I480 Paroxysmal atrial fibrillation: Secondary | ICD-10-CM | POA: Diagnosis present

## 2020-09-06 DIAGNOSIS — J9621 Acute and chronic respiratory failure with hypoxia: Secondary | ICD-10-CM | POA: Diagnosis present

## 2020-09-06 DIAGNOSIS — I4891 Unspecified atrial fibrillation: Secondary | ICD-10-CM | POA: Diagnosis not present

## 2020-09-06 DIAGNOSIS — Z7951 Long term (current) use of inhaled steroids: Secondary | ICD-10-CM | POA: Diagnosis not present

## 2020-09-06 LAB — BASIC METABOLIC PANEL
Anion gap: 14 (ref 5–15)
BUN: 16 mg/dL (ref 8–23)
CO2: 27 mmol/L (ref 22–32)
Calcium: 8.3 mg/dL — ABNORMAL LOW (ref 8.9–10.3)
Chloride: 91 mmol/L — ABNORMAL LOW (ref 98–111)
Creatinine, Ser: 0.99 mg/dL (ref 0.61–1.24)
GFR, Estimated: >60 mL/min (ref >60)
Glucose, Bld: 101 mg/dL — ABNORMAL HIGH (ref 70–99)
Potassium: 3.7 mmol/L (ref 3.5–5.1)
Sodium: 132 mmol/L — ABNORMAL LOW (ref 135–145)

## 2020-09-06 LAB — LACTIC ACID, PLASMA
Lactic Acid, Venous: 1.3 mmol/L (ref 0.5–1.9)
Lactic Acid, Venous: 0.9 mmol/L (ref 0.5–1.9)
Lactic Acid, Venous: 1.3 mmol/L (ref 0.5–1.9)

## 2020-09-06 LAB — CBC WITH DIFFERENTIAL/PLATELET
Abs Immature Granulocytes: 0.09 10*3/uL — ABNORMAL HIGH (ref 0.00–0.07)
Basophils Absolute: 0 10*3/uL (ref 0.0–0.1)
Basophils Relative: 0 %
Eosinophils Absolute: 0 10*3/uL (ref 0.0–0.5)
Eosinophils Relative: 0 %
HCT: 30.7 % — ABNORMAL LOW (ref 39.0–52.0)
Hemoglobin: 9.5 g/dL — ABNORMAL LOW (ref 13.0–17.0)
Immature Granulocytes: 1 %
Lymphocytes Relative: 11 %
Lymphs Abs: 1.3 10*3/uL (ref 0.7–4.0)
MCH: 24.9 pg — ABNORMAL LOW (ref 26.0–34.0)
MCHC: 30.9 g/dL (ref 30.0–36.0)
MCV: 80.6 fL (ref 80.0–100.0)
Monocytes Absolute: 1.3 10*3/uL — ABNORMAL HIGH (ref 0.1–1.0)
Monocytes Relative: 11 %
Neutro Abs: 9.2 10*3/uL — ABNORMAL HIGH (ref 1.7–7.7)
Neutrophils Relative %: 77 %
Platelets: 309 10*3/uL (ref 150–400)
RBC: 3.81 MIL/uL — ABNORMAL LOW (ref 4.22–5.81)
RDW: 16.4 % — ABNORMAL HIGH (ref 11.5–15.5)
WBC: 11.9 10*3/uL — ABNORMAL HIGH (ref 4.0–10.5)
nRBC: 0 % (ref 0.0–0.2)

## 2020-09-06 LAB — TROPONIN I (HIGH SENSITIVITY)
Troponin I (High Sensitivity): 79 ng/L — ABNORMAL HIGH (ref <18)
Troponin I (High Sensitivity): 100 ng/L (ref <18)
Troponin I (High Sensitivity): 90 ng/L — ABNORMAL HIGH (ref <18)

## 2020-09-06 LAB — GLUCOSE, CAPILLARY
Glucose-Capillary: 145 mg/dL — ABNORMAL HIGH (ref 70–99)
Glucose-Capillary: 148 mg/dL — ABNORMAL HIGH (ref 70–99)

## 2020-09-06 LAB — BRAIN NATRIURETIC PEPTIDE: B Natriuretic Peptide: 1799 pg/mL — ABNORMAL HIGH (ref 0.0–100.0)

## 2020-09-06 LAB — PROTIME-INR
INR: 1.1 (ref 0.8–1.2)
Prothrombin Time: 14.1 seconds (ref 11.4–15.2)

## 2020-09-06 LAB — CK: Total CK: 2244 U/L — ABNORMAL HIGH (ref 49–397)

## 2020-09-06 LAB — PROCALCITONIN: Procalcitonin: <0.1 ng/mL

## 2020-09-06 MED ORDER — NICOTINE 21 MG/24HR TD PT24
21.0000 mg | MEDICATED_PATCH | Freq: Every day | TRANSDERMAL | Status: DC
  Administered 2020-09-06 – 2020-09-10 (×5): 21 mg via TRANSDERMAL
  Filled 2020-09-06 (×5): qty 1

## 2020-09-06 MED ORDER — ACETAMINOPHEN 325 MG PO TABS: 650.0000 mg | ORAL_TABLET | Freq: Four times a day (QID) | ORAL | Status: DC | PRN

## 2020-09-06 MED ORDER — SODIUM CHLORIDE 0.9 % IV SOLN: INTRAVENOUS | Status: DC

## 2020-09-06 MED ORDER — CHLORHEXIDINE GLUCONATE CLOTH 2 % EX PADS
6.0000 | MEDICATED_PAD | Freq: Every day | CUTANEOUS | Status: DC
  Administered 2020-09-06 – 2020-09-09 (×4): 6 via TOPICAL

## 2020-09-06 MED ORDER — OXYCODONE-ACETAMINOPHEN 5-325 MG PO TABS
1.0000 | ORAL_TABLET | ORAL | Status: DC | PRN
Start: 2020-09-06 — End: 2020-09-09
  Administered 2020-09-06 – 2020-09-09 (×15): 1 via ORAL
  Filled 2020-09-06 (×15): qty 1

## 2020-09-06 MED ORDER — SODIUM CHLORIDE 0.9 % IV BOLUS
500.0000 mL | Freq: Once | INTRAVENOUS | Status: AC
  Administered 2020-09-06: 500 mL via INTRAVENOUS

## 2020-09-06 MED ORDER — ACETAMINOPHEN 500 MG PO TABS
1000.0000 mg | ORAL_TABLET | Freq: Once | ORAL | Status: AC
  Administered 2020-09-06: 1000 mg via ORAL
  Filled 2020-09-06: qty 2

## 2020-09-06 MED ORDER — SODIUM CHLORIDE 0.9 % IV SOLN
2.0000 g | INTRAVENOUS | Status: DC
  Administered 2020-09-06: 2 g via INTRAVENOUS
  Filled 2020-09-06: qty 20

## 2020-09-06 MED ORDER — SODIUM CHLORIDE 0.9 % IV SOLN
500.0000 mg | INTRAVENOUS | Status: DC
  Filled 2020-09-06: qty 500

## 2020-09-06 MED ORDER — PANTOPRAZOLE SODIUM 40 MG PO TBEC
40.0000 mg | DELAYED_RELEASE_TABLET | Freq: Every day | ORAL | Status: DC
  Administered 2020-09-06 – 2020-09-10 (×5): 40 mg via ORAL
  Filled 2020-09-06 (×5): qty 1

## 2020-09-06 MED ORDER — IPRATROPIUM-ALBUTEROL 0.5-2.5 (3) MG/3ML IN SOLN
3.0000 mL | RESPIRATORY_TRACT | Status: DC
  Administered 2020-09-06 (×2): 3 mL via RESPIRATORY_TRACT
  Filled 2020-09-06 (×2): qty 3

## 2020-09-06 MED ORDER — PRAVASTATIN SODIUM 40 MG PO TABS
40.0000 mg | ORAL_TABLET | Freq: Every day | ORAL | Status: DC
  Administered 2020-09-06: 40 mg via ORAL
  Filled 2020-09-06 (×2): qty 1

## 2020-09-06 MED ORDER — DM-GUAIFENESIN ER 30-600 MG PO TB12
1.0000 | ORAL_TABLET | Freq: Two times a day (BID) | ORAL | Status: DC | PRN
  Administered 2020-09-07 – 2020-09-09 (×2): 1 via ORAL
  Filled 2020-09-06 (×2): qty 1

## 2020-09-06 MED ORDER — ONDANSETRON HCL 4 MG/2ML IJ SOLN: 4.0000 mg | Freq: Three times a day (TID) | INTRAMUSCULAR | Status: DC | PRN

## 2020-09-06 MED ORDER — LACTATED RINGERS IV BOLUS
1000.0000 mL | Freq: Once | INTRAVENOUS | Status: AC
  Administered 2020-09-06: 1000 mL via INTRAVENOUS

## 2020-09-06 MED ORDER — ENOXAPARIN SODIUM 60 MG/0.6ML IJ SOSY
0.5000 mg/kg | PREFILLED_SYRINGE | INTRAMUSCULAR | Status: DC
  Administered 2020-09-06 – 2020-09-09 (×4): 50 mg via SUBCUTANEOUS
  Filled 2020-09-06 (×5): qty 0.5

## 2020-09-06 MED ORDER — ALBUTEROL SULFATE (2.5 MG/3ML) 0.083% IN NEBU: 2.5000 mg | INHALATION_SOLUTION | RESPIRATORY_TRACT | Status: DC | PRN

## 2020-09-06 MED ORDER — METHYLPREDNISOLONE SODIUM SUCC 40 MG IJ SOLR
40.0000 mg | Freq: Two times a day (BID) | INTRAMUSCULAR | Status: DC
  Administered 2020-09-06 – 2020-09-07 (×3): 40 mg via INTRAVENOUS
  Filled 2020-09-06 (×3): qty 1

## 2020-09-06 MED ORDER — IPRATROPIUM-ALBUTEROL 0.5-2.5 (3) MG/3ML IN SOLN
3.0000 mL | Freq: Four times a day (QID) | RESPIRATORY_TRACT | Status: DC
  Administered 2020-09-07 (×3): 3 mL via RESPIRATORY_TRACT
  Filled 2020-09-06 (×2): qty 3

## 2020-09-06 MED ORDER — SODIUM CHLORIDE 0.9 % IV BOLUS
1000.0000 mL | Freq: Once | INTRAVENOUS | Status: AC
  Administered 2020-09-06: 1000 mL via INTRAVENOUS

## 2020-09-06 MED ORDER — SODIUM CHLORIDE 0.9 % IV SOLN
1.0000 g | Freq: Once | INTRAVENOUS | Status: DC
  Filled 2020-09-06: qty 10

## 2020-09-06 MED ORDER — AZITHROMYCIN 500 MG PO TABS
500.0000 mg | ORAL_TABLET | Freq: Once | ORAL | Status: DC
  Administered 2020-09-06: 500 mg via ORAL
  Filled 2020-09-06: qty 1

## 2020-09-06 NOTE — ED Notes (Signed)
Rounding on pt and he awakes stating that his feet and legs are "hurting and burning", MD aware. Tylenol 1 gram ordered. When going back into the room to administer Tylenol, pt is back asleep.

## 2020-09-06 NOTE — ED Provider Notes (Signed)
-----------------------------------------   4:45 AM on 09/06/2020 -----------------------------------------  Was brought to my attention patient's blood pressure has been trending downward and is now in the 80s.  He is in no distress although he is reporting some pain in his lower legs which is apparently chronic.  Given his downtrending blood pressure, I ordered a repeat "possible sepsis" lab work as well as a CK because his CK was elevated in the 600s when he first arrived.  I ordered 1000 mg of Tylenol by mouth and 1 L lactated ringer.   ----------------------------------------- 8:30 AM on 09/06/2020 -----------------------------------------  Patient's labs are notable for a rising leukocytosis when he had a normal white blood cell count when he first came to the emergency department.  Of some surprise he also has a rising CK level which is up to 2244, almost quadrupling since he first arrived.  He has metabolic panel is unremarkable and his lactic acid was normal.  I reviewed his prior work-up and see that there was a question of possible pneumonia when he first arrived even though it was thought to be atelectasis.  At this point, however, he meets criteria for admission given his hypotension, increasing leukocytosis, increasing CK concerning for rhabdomyolysis, and probable pneumonia (aspiration is possible given his alcohol abuse).  I talked by phone with Dr. Clyde Lundborg with hospitalist service and he will admit.  Ceftriaxone 2 g IV ordered by the hospitalist.   Loleta Rose, MD 09/06/20 [phone]

## 2020-09-06 NOTE — H&P (Addendum)
History and Physical    RC AMISON YQM:[id number] DOB: 1955/11/09 DOA: 09/04/2020  Referring MD/NP/PA:   PCP: Pcp, No   Patient coming from:  The patient is coming from home.    Chief Complaint: fall, alcohol intoxication, shortness of breath  HPI: Aaron Harvey is a 65 y.o. male with medical history significant of hypertension, hyperlipidemia, COPD (using as needed oxygen at night), GERD, gout, depression, anxiety, alcohol abuse, tobacco abuse, chronic back pain, who presents with fall, alcohol intoxication in the short breath.  Patient initially presented to the ED on 7/2 due to alcohol intoxication and fall.  Patient had negative CT of head for acute intracranial abnormalities.  Later on patient developed shortness of breath, with oxygen desaturating to 89% on room air, currently on 2 L oxygen with 93% saturation.  Patient has cough with little mucus production and shortness of breath.  He states that he uses 4 L oxygen sometimes in the night as needed.  Patient denies chest pain, fever or chills.  No nausea, vomiting, diarrhea or abdominal pain.  He developed urinary retention, bladder scan showed 900 cc residual urine, Foley catheter is placed daily.  No symptoms of UTI.  Patient complains of chronic back pain and leg pain.  His lower leg and feet are mildly erythematous, but no warmth.  Currently patient is drowsy, but easily arousable, oriented x3.  Moves all extremities normally.  No facial droop or slurred speech  Patient was found to have hypotension with blood pressure 81/49, which improved to 91/53 after giving 1 L LR and 1 L of normal saline in ED.  ED Course: pt was found to have alcohol level 254, negative UA, negative COVID PCR, CK level 629 --> 2244, WBC 6.0 --> 11.9, troponin level 59 --> 63, lactic acid 1.3.  Electrolytes renal function okay, temperature normal, tachycardia with heart rate 116, RR 21.  Chest x-ray showed mild bilateral basilar subsegmental opacity. Patient is  admitted to progressive bed as inpatient  Review of Systems:   General: no fevers, chills, no body weight gain, has fatigue HEENT: no blurry vision, hearing changes or sore throat Respiratory: has dyspnea, coughing,  CV: no chest pain, no palpitations GI: no nausea, vomiting, abdominal pain, diarrhea, constipation GU: no dysuria, burning on urination, increased urinary frequency, hematuria  Ext: Has mild bilateral lower leg edema and mild erythema in both leg and feet. Neuro: no unilateral weakness, numbness, or tingling, no vision change or hearing loss Skin: no rash, no skin tear. MSK: has chronic low back pain Heme: No easy bruising.  Travel history: No recent long distant travel.  Allergy: No Known Allergies  Past Medical History:  Diagnosis Date   Alcohol addiction (HCC)    Anxiety    COPD (chronic obstructive pulmonary disease) (HCC)    Depression    Gout    Hyperlipidemia    Hypertension     Past Surgical History:  Procedure Laterality Date   BACK SURGERY     CARDIAC SURGERY     CERVICAL DISC SURGERY     TIBIA IM NAIL INSERTION Right 06/08/2020   Procedure: INTRAMEDULLARY (IM) NAIL TIBIAL;  Surgeon: Kennedy Bucker, MD;  Location: [facility] ORS;  Service: Orthopedics;  Laterality: Right;    Social History:  reports that he has been smoking cigarettes. He has a 60.00 pack-year smoking history. He has never used smokeless tobacco. He reports current alcohol use of about 24.0 standard drinks of alcohol per week. He reports current drug  use. Drug: Marijuana.  Family History: I have tried to review with patient about family medical history, but patient cannot provide detailed information about family medical history.  Prior to Admission medications   Medication Sig Start Date End Date Taking? Authorizing Provider  albuterol (VENTOLIN HFA) 108 (90 Base) MCG/ACT inhaler Inhale 2 puffs into the lungs every 4 (four) hours as needed. Reported on 09/15/2015 08/31/20  Yes Karamalegos,  Netta Neat, DO  aspirin EC 81 MG tablet Take 1 tablet (81 mg total) by mouth daily. 08/31/20  Yes Karamalegos, Netta Neat, DO  furosemide (LASIX) 40 MG tablet Take 1 tablet (40 mg total) by mouth daily. 08/31/20  Yes Karamalegos, Netta Neat, DO  gabapentin (NEURONTIN) 300 MG capsule Take 2 capsules (600 mg total) by mouth 3 (three) times daily. 08/31/20  Yes Karamalegos, Netta Neat, DO  loratadine (CLARITIN) 10 MG tablet Take 1 tablet (10 mg total) by mouth daily. 06/12/20  Yes Lynn Ito, MD  methocarbamol (ROBAXIN) 500 MG tablet Take 1 tablet (500 mg total) by mouth every 6 (six) hours as needed for muscle spasms. 08/31/20  Yes Karamalegos, Netta Neat, DO  metoprolol tartrate (LOPRESSOR) 25 MG tablet Take 1 tablet (25 mg total) by mouth 2 (two) times daily. 08/31/20  Yes Karamalegos, Netta Neat, DO  omeprazole (PRILOSEC) 40 MG capsule Take 1 capsule (40 mg total) by mouth daily before breakfast. 08/31/20  Yes Karamalegos, Netta Neat, DO  PARoxetine (PAXIL) 10 MG tablet Take 1 tablet (10 mg total) by mouth daily. 08/31/20  Yes Karamalegos, Netta Neat, DO  potassium chloride SA (KLOR-CON) 20 MEQ tablet Take 1 tablet (20 mEq total) by mouth daily. 08/31/20  Yes Karamalegos, Netta Neat, DO  pravastatin (PRAVACHOL) 40 MG tablet Take 1 tablet (40 mg total) by mouth at bedtime. 08/31/20  Yes Karamalegos, Netta Neat, DO  tamsulosin (FLOMAX) 0.4 MG CAPS capsule Take 1 capsule (0.4 mg total) by mouth daily after supper. 08/31/20  Yes Karamalegos, Alexander J, DO  TRELEGY ELLIPTA 100-62.5-25 MCG/INH AEPB Inhale 1 puff into the lungs daily. 08/31/20  Yes Smitty Cords, DO    Physical Exam: Vitals:   09/06/20 1300 09/06/20 1315 09/06/20 1358 09/06/20 1401  BP: (!) 143/72   (!) 106/59  Pulse: 96 (!) 103  (!) 102  Resp: Temp:    98 F (36.7 C)  TempSrc:      SpO2: 97% 100%  98%  Weight:   91 kg   Height:       General: Not in acute distress HEENT:       Eyes: PERRL, EOMI, no  scleral icterus.       ENT: No discharge from the ears and nose, no pharynx injection, no tonsillar enlargement.        Neck: No JVD, no bruit, no mass felt. Heme: No neck lymph node enlargement. Cardiac: S1/S2, RRR, No murmurs, No gallops or rubs. Respiratory: Has rhonchi bilaterally GI: Soft, nondistended, nontender, no rebound pain, no organomegaly, BS present. GU: No hematuria Ext: has trace leg edema with mild erythema bilaterally, no warmth.  1+DP/PT pulse bilaterally. Musculoskeletal: No joint deformities, No joint redness or warmth, no limitation of ROM in spin. Skin: No rashes.  Neuro: Patient is drowsy, sleepy, easily arousable, orientated x3, cranial nerves II-XII grossly intact, moves all extremities normally. Psych: Patient is not psychotic, no suicidal or hemocidal ideation.  Labs on Admission: I have personally reviewed following labs and imaging studies  CBC: Recent Labs  Lab 09/04/20 1518 09/06/20 0459  WBC 6.3 11.9*  NEUTROABS  --  9.2*  HGB 9.7* 9.5*  HCT 31.3* 30.7*  MCV 79.6* 80.6  PLT 286 309   Basic Metabolic Panel: Recent Labs  Lab 09/04/20 1518 09/06/20 0459  NA 129* 132*  K 4.1 3.7  CL 96* 91*  CO2 25 27  GLUCOSE 94 101*  BUN 7* 16  CREATININE 0.68 0.99  CALCIUM 8.3* 8.3*   GFR: Estimated Creatinine Clearance: 81.4 mL/min (by C-G formula based on SCr of 0.99 mg/dL). Liver Function Tests: Recent Labs  Lab 09/04/20 1518  AST 20  ALT 10  ALKPHOS 54  BILITOT 0.5  PROT 6.9  ALBUMIN 3.4*   No results for input(s): LIPASE, AMYLASE in the last 168 hours. No results for input(s): AMMONIA in the last 168 hours. Coagulation Profile: Recent Labs  Lab 09/06/20 0842  INR 1.1   Cardiac Enzymes: Recent Labs  Lab 09/04/20 1518 09/06/20 0459  CKTOTAL 629* 2,244*   BNP (last 3 results) No results for input(s): PROBNP in the last 8760 hours. HbA1C: No results for input(s): HGBA1C in the last 72 hours. CBG: No results for input(s):  GLUCAP in the last 168 hours. Lipid Profile: No results for input(s): CHOL, HDL, LDLCALC, TRIG, CHOLHDL, LDLDIRECT in the last 72 hours. Thyroid Function Tests: No results for input(s): TSH, T4TOTAL, FREET4, T3FREE, THYROIDAB in the last 72 hours. Anemia Panel: No results for input(s): VITAMINB12, FOLATE, FERRITIN, TIBC, IRON, RETICCTPCT in the last 72 hours. Urine analysis:    Component Value Date/Time   COLORURINE YELLOW (A) 09/04/2020 1518   APPEARANCEUR CLEAR (A) 09/04/2020 1518   LABSPEC 1.004 (L) 09/04/2020 1518   PHURINE 6.0 09/04/2020 1518   GLUCOSEU NEGATIVE 09/04/2020 1518   HGBUR NEGATIVE 09/04/2020 1518   BILIRUBINUR NEGATIVE 09/04/2020 1518   KETONESUR NEGATIVE 09/04/2020 1518   PROTEINUR NEGATIVE 09/04/2020 1518   NITRITE NEGATIVE 09/04/2020 1518   LEUKOCYTESUR NEGATIVE 09/04/2020 1518   Sepsis Labs: @LABRCNTIP (procalcitonin:4,lacticidven:4) ) Recent Results (from the past 240 hour(s))  Resp Panel by RT-PCR (Flu A&B, Covid) Nasopharyngeal Swab     Status: None   Collection Time: 09/04/20  3:20 PM   Specimen: Nasopharyngeal Swab; Nasopharyngeal(NP) swabs in vial transport medium  Result Value Ref Range Status   SARS Coronavirus 2 by RT PCR NEGATIVE NEGATIVE Final    Comment: (NOTE) SARS-CoV-2 target nucleic acids are NOT DETECTED.  The SARS-CoV-2 RNA is generally detectable in upper respiratory specimens during the acute phase of infection. The lowest concentration of SARS-CoV-2 viral copies this assay can detect is 138 copies/mL. A negative result does not preclude SARS-Cov-2 infection and should not be used as the sole basis for treatment or other patient management decisions. A negative result may occur with  improper specimen collection/handling, submission of specimen other than nasopharyngeal swab, presence of viral mutation(s) within the areas targeted by this assay, and inadequate number of viral copies(<138 copies/mL). A negative result must be  combined with clinical observations, patient history, and epidemiological information. The expected result is Negative.  Fact Sheet for Patients:  BloggerCourse.comhttps://www.fda.gov/media/152166/download  Fact Sheet for Healthcare Providers:  SeriousBroker.ithttps://www.fda.gov/media/152162/download  This test is no t yet approved or cleared by the [place] FDA and  has been authorized for detection and/or diagnosis of SARS-CoV-2 by FDA under an Emergency Use Authorization (EUA). This EUA will remain  in effect (meaning this test can be used) for the duration of the COVID-19 declaration under Section 564(b)(1) of the Act, 21  U.S.C.section 360bbb-3(b)(1), unless the authorization is terminated  or revoked sooner.       Influenza A by PCR NEGATIVE NEGATIVE Final   Influenza B by PCR NEGATIVE NEGATIVE Final    Comment: (NOTE) The Xpert Xpress SARS-CoV-2/FLU/RSV plus assay is intended as an aid in the diagnosis of influenza from Nasopharyngeal swab specimens and should not be used as a sole basis for treatment. Nasal washings and aspirates are unacceptable for Xpert Xpress SARS-CoV-2/FLU/RSV testing.  Fact Sheet for Patients: BloggerCourse.com  Fact Sheet for Healthcare Providers: SeriousBroker.it  This test is not yet approved or cleared by the [place] FDA and has been authorized for detection and/or diagnosis of SARS-CoV-2 by FDA under an Emergency Use Authorization (EUA). This EUA will remain in effect (meaning this test can be used) for the duration of the COVID-19 declaration under Section 564(b)(1) of the Act, 21 U.S.C. section 360bbb-3(b)(1), unless the authorization is terminated or revoked.  Performed at [facility], [address], [place], [place] [address]      Radiological Exams on Admission: CT Head Wo Contrast  Result Date: 09/04/2020 CLINICAL DATA:  Head injury. EXAM: CT HEAD WITHOUT CONTRAST TECHNIQUE: Contiguous  axial images were obtained from the base of the skull through the vertex without intravenous contrast. COMPARISON:  June 05, 2020. FINDINGS: Brain: No evidence of acute infarction, hemorrhage, hydrocephalus, extra-axial collection or mass lesion/mass effect. Vascular: No hyperdense vessel or unexpected calcification. Skull: Normal. Negative for fracture or focal lesion. Sinuses/Orbits: No acute finding. Other: None. IMPRESSION: No acute intracranial abnormality seen. Electronically Signed   By: Lupita Raider M.D.   On: 09/04/2020 16:14   DG Chest Portable 1 View  Result Date: 09/04/2020 CLINICAL DATA:  Weakness. EXAM: PORTABLE CHEST 1 VIEW COMPARISON:  June 05, 2020. FINDINGS: Stable cardiomediastinal silhouette. No pneumothorax or pleural effusion is noted. Mild bibasilar subsegmental atelectasis or infiltrates are noted. Bony thorax is unremarkable. IMPRESSION: Mild bibasilar subsegmental atelectasis or infiltrates are noted. Electronically Signed   By: Lupita Raider M.D.   On: 09/04/2020 15:39     EKG: I have personally reviewed.  Sinus rhythm, QTC 451, right bundle blockage, early R wave progression  Assessment/Plan Principal Problem:   Acute on chronic respiratory failure with hypoxia (HCC) Active Problems:   Hypertension   Acute urinary retention   Chronic pain syndrome   Gastroesophageal reflux disease without esophagitis   Tobacco abuse   Alcohol intoxication (HCC)   HLD (hyperlipidemia)   COPD exacerbation (HCC)   Fall   Sepsis (HCC)   Elevated troponin   Rhabdomyolysis   CAP (community acquired pneumonia)   Swelling of lower leg   Acute on chronic respiratory failure with hypoxia and sepsis due to CAP (community acquired pneumonia) and COPD exacerbation: Chest x-ray showed mild bilateral basilar subsegmental opacity indicating CAP.  Patient has rhonchi on auscultation, indicating COPD exacerbation.  Patient meets criteria for sepsis with tachycardia with heart rate 116,  tachypnea with R   -Admitted to progressive unit as inpatient - IV Rocephin and azithromycin - Mucinex for cough  - Bronchodilators - Urine legionella and S. pneumococcal antigen - Follow up blood culture x2, sputum culture - will get Procalcitonin - IVF: 1L of LR and 1.5 L of NS (patient has diastolic congestive heart failure, limiting aggressive IV fluids treatment)  Hypertension -d/c Lasix, metoprolol due to hypotension  Acute urinary retention -Placed Foley catheter on 7/2 --> will d/c it now.  -Hold Flomax due to hypotension  Chronic  pain syndrome: Patient complains of severe back pain -Continue home Neurontin, Robaxin -per tylenol and percocet  Gastroesophageal reflux disease without esophagitis -Protonix  Tobacco abuse and Alcohol intoxication (HCC) -Nicotine patch -CIWA protocol  HLD (hyperlipidemia) -Pravastatin  Fall: Most likely due to alcohol intoxication.  CT head negative. -PT/OT  Elevated troponin: Troponin level 59, 63.  Most likely due to demand ischemia.  Currently no chest pain -Aspirin, pravastatin -Trend troponin -Check A1c, FLP  Rhabdomyolysis: CK level 2244 -IV fluid as above (patient has significantly elevated BNP 1799, limiting aggressive IV fluid treatment) -Repeat CK level in morning  Swelling of lower leg: 2D echo on 06/07/2020 showed EF of 65 to 70%.  BNP is elevated 1799, but no pulm edema chest x-ray. -get lower extremity Doppler to rule out DVT      DVT ppx: SQ Lovenox Code Status: Full code Family Communication: not done, no family member is at bed side.     I have tried to call her niece and left a message to her. Disposition Plan:  Anticipate discharge back to previous environment Consults called:  none Admission status and Level of care: Progressive Cardiac:    as inpt       Status is: Inpatient  Remains inpatient appropriate because:Inpatient level of care appropriate due to severity of illness  Dispo: The patient is  from: Home              Anticipated d/c is to:  to be determined              Patient currently is not medically stable to d/c.   Difficult to place patient No         Date of Service 09/06/2020    Lorretta Harp [facility]   If 7PM-7AM, please contact night-coverage [email or web address] 09/06/2020, 2:59 PM

## 2020-09-06 NOTE — ED Notes (Signed)
Kelly RN aware of assigned bed 

## 2020-09-06 NOTE — ED Notes (Signed)
Pt adjusted him bed.

## 2020-09-06 NOTE — Progress Notes (Signed)
PHARMACIST - PHYSICIAN COMMUNICATION  CONCERNING:  Enoxaparin (Lovenox) for DVT Prophylaxis    RECOMMENDATION: Patient was prescribed enoxaparin 40mg  q24 hours for VTE prophylaxis.   Filed Weights   09/04/20 1513  Weight: 98.9 kg (218 lb)    Body mass index is 33.15 kg/m.  Estimated Creatinine Clearance: 84.8 mL/min (by C-G formula based on SCr of 0.99 mg/dL).   Based on [facility] policy patient is candidate for enoxaparin 0.5mg /kg TBW SQ every 24 hours based on BMI being >30.   DESCRIPTION: Pharmacy has adjusted enoxaparin dose per [facility] policy.  Patient is now receiving enoxaparin 50 mg every 24 hours   [facility] 09/06/2020 12:31 PM

## 2020-09-06 NOTE — ED Notes (Signed)
Lab at bedside

## 2020-09-06 NOTE — ED Notes (Signed)
Lab called to collect blood specimen  

## 2020-09-06 NOTE — Progress Notes (Addendum)
Date and time results received: 09/06/20 1540  Test: Troponin    Critical Value: 100  Name of Provider Notified: Dr. Clyde Lundborg via secure chat

## 2020-09-06 NOTE — Progress Notes (Signed)
OT Cancellation Note  Patient Details Name: Aaron Harvey MRN: [id number] DOB: 25-Jul-1955   Cancelled Treatment:    Reason Eval/Treat Not Completed: Patient declined, no reason specified;Pain limiting ability to participate. Consult received, chart reviewed. Upon attempt, pt lying on R side, endorsing 10+/10 pain in legs and feet. Pt agreeable to OT evaluation at later date/time in coordination with improved pain management.   Wynona Canes, MPH, MS, OTR/L ascom [phone] 09/06/20, 11:03 AM

## 2020-09-06 NOTE — Progress Notes (Addendum)
Patient arrived on the unit groggy and oriented to self and place.  Patient complains of having a lot of pain in his back.  Gave patient percocet and gabapentin.   Waiting on pharmacy to send the robaxin   Patient had a urinary catheter in place on arrival to the unit due to retention on 09/04/2020.  There was not an order for the catheter.  Paged MD.   Per MD- will discontinue and monitor.  Will replaced if needed.

## 2020-09-06 NOTE — ED Notes (Signed)
MD notified and aware of pt's BP. LR bolus and labs ordered.

## 2020-09-06 NOTE — ED Notes (Signed)
Patient becomes hypotensive while sleeping, MD aware

## 2020-09-06 NOTE — Progress Notes (Signed)
Called and spoke to patient family.  Spoke to Niece (per patient request). Gave update per MD's notes on plan of care.     Niece would like to speak to case manager regarding SNF options.  Does not want patient to go to [facility] in [place].

## 2020-09-06 NOTE — ED Notes (Signed)
Pt oxygen sats dropped to 85% on 2L Aaron Harvey while sleeping. Pt awakened and oxygen sats increased to 92%. Pt oxygen increased to 4L Aaron Harvey while sleeping.

## 2020-09-07 DIAGNOSIS — I5033 Acute on chronic diastolic (congestive) heart failure: Secondary | ICD-10-CM

## 2020-09-07 LAB — CBC
HCT: 32.2 % — ABNORMAL LOW (ref 39.0–52.0)
Hemoglobin: 9.6 g/dL — ABNORMAL LOW (ref 13.0–17.0)
MCH: 23.8 pg — ABNORMAL LOW (ref 26.0–34.0)
MCHC: 29.8 g/dL — ABNORMAL LOW (ref 30.0–36.0)
MCV: 79.7 fL — ABNORMAL LOW (ref 80.0–100.0)
Platelets: 362 10*3/uL (ref 150–400)
RBC: 4.04 MIL/uL — ABNORMAL LOW (ref 4.22–5.81)
RDW: 15.9 % — ABNORMAL HIGH (ref 11.5–15.5)
WBC: 9.4 10*3/uL (ref 4.0–10.5)
nRBC: 0 % (ref 0.0–0.2)

## 2020-09-07 LAB — LIPID PANEL
Cholesterol: 150 mg/dL (ref 0–200)
HDL: 41 mg/dL (ref >40)
LDL Cholesterol: 98 mg/dL (ref 0–99)
Total CHOL/HDL Ratio: 3.7 RATIO
Triglycerides: 53 mg/dL (ref <150)
VLDL: 11 mg/dL (ref 0–40)

## 2020-09-07 LAB — BASIC METABOLIC PANEL
Anion gap: 8 (ref 5–15)
BUN: 15 mg/dL (ref 8–23)
CO2: 31 mmol/L (ref 22–32)
Calcium: 8.4 mg/dL — ABNORMAL LOW (ref 8.9–10.3)
Chloride: 91 mmol/L — ABNORMAL LOW (ref 98–111)
Creatinine, Ser: 0.73 mg/dL (ref 0.61–1.24)
GFR, Estimated: >60 mL/min (ref >60)
Glucose, Bld: 160 mg/dL — ABNORMAL HIGH (ref 70–99)
Potassium: 4.1 mmol/L (ref 3.5–5.1)
Sodium: 130 mmol/L — ABNORMAL LOW (ref 135–145)

## 2020-09-07 LAB — GLUCOSE, CAPILLARY: Glucose-Capillary: 161 mg/dL — ABNORMAL HIGH (ref 70–99)

## 2020-09-07 LAB — IRON AND TIBC
Iron: 19 ug/dL — ABNORMAL LOW (ref 45–182)
Saturation Ratios: 5 % — ABNORMAL LOW (ref 17.9–39.5)
TIBC: 413 ug/dL (ref 250–450)
UIBC: 394 ug/dL

## 2020-09-07 LAB — HEMOGLOBIN A1C
Hgb A1c MFr Bld: 5.1 % (ref 4.8–5.6)
Mean Plasma Glucose: 100 mg/dL

## 2020-09-07 LAB — CK: Total CK: 2838 U/L — ABNORMAL HIGH (ref 49–397)

## 2020-09-07 LAB — VITAMIN B12: Vitamin B-12: 162 pg/mL — ABNORMAL LOW (ref 180–914)

## 2020-09-07 MED ORDER — DILTIAZEM HCL ER COATED BEADS 120 MG PO CP24
240.0000 mg | ORAL_CAPSULE | Freq: Every day | ORAL | Status: DC
  Administered 2020-09-07 – 2020-09-09 (×3): 240 mg via ORAL
  Filled 2020-09-07 (×3): qty 2

## 2020-09-07 MED ORDER — IPRATROPIUM-ALBUTEROL 0.5-2.5 (3) MG/3ML IN SOLN
3.0000 mL | Freq: Three times a day (TID) | RESPIRATORY_TRACT | Status: DC
  Administered 2020-09-07 – 2020-09-10 (×9): 3 mL via RESPIRATORY_TRACT
  Filled 2020-09-07 (×10): qty 3

## 2020-09-07 MED ORDER — AMIODARONE LOAD VIA INFUSION
150.0000 mg | Freq: Once | INTRAVENOUS | Status: AC
  Administered 2020-09-07: 150 mg via INTRAVENOUS
  Filled 2020-09-07: qty 83.34

## 2020-09-07 MED ORDER — AMIODARONE HCL IN DEXTROSE 360-4.14 MG/200ML-% IV SOLN
60.0000 mg/h | INTRAVENOUS | Status: DC
  Administered 2020-09-07: 60 mg/h via INTRAVENOUS
  Filled 2020-09-07 (×2): qty 200

## 2020-09-07 MED ORDER — DILTIAZEM HCL 25 MG/5ML IV SOLN
10.0000 mg | Freq: Once | INTRAVENOUS | Status: AC
  Administered 2020-09-07: 10 mg via INTRAVENOUS
  Filled 2020-09-07: qty 5

## 2020-09-07 MED ORDER — DIGOXIN 0.25 MG/ML IJ SOLN
0.2500 mg | Freq: Once | INTRAMUSCULAR | Status: AC
  Administered 2020-09-07: 0.25 mg via INTRAVENOUS
  Filled 2020-09-07: qty 2

## 2020-09-07 MED ORDER — FUROSEMIDE 10 MG/ML IJ SOLN
40.0000 mg | Freq: Two times a day (BID) | INTRAMUSCULAR | Status: DC
  Administered 2020-09-07 – 2020-09-09 (×5): 40 mg via INTRAVENOUS
  Filled 2020-09-07 (×6): qty 4

## 2020-09-07 MED ORDER — TAMSULOSIN HCL 0.4 MG PO CAPS
0.4000 mg | ORAL_CAPSULE | Freq: Every day | ORAL | Status: DC
  Administered 2020-09-07 – 2020-09-10 (×4): 0.4 mg via ORAL
  Filled 2020-09-07 (×4): qty 1

## 2020-09-07 MED ORDER — AMIODARONE HCL IN DEXTROSE 360-4.14 MG/200ML-% IV SOLN
30.0000 mg/h | INTRAVENOUS | Status: DC
  Administered 2020-09-07 – 2020-09-08 (×2): 30 mg/h via INTRAVENOUS
  Filled 2020-09-07 (×2): qty 200

## 2020-09-07 NOTE — TOC Transition Note (Signed)
Transition of Care [facility]) - CM/SW Discharge Note   Patient Details  Name: Aaron Harvey MRN: [id number] Date of Birth: [birth year]  [facility] CM/SW Contact:  Maree Krabbe, LCSW Phone Number: 09/07/2020, 1:16 PM   Clinical Narrative:  Pt lives with Niece and Nephew. Pt is agreeable to SNF. Pt has been to [facility] in the past and would like to go there again at d/c. Referral sent.       Barriers to Discharge: Continued Medical Work up   Patient Goals and CMS Choice        Discharge Placement                       Discharge Plan and Services In-house Referral: Clinical Social Work                                   Social Determinants of Health (SDOH) Interventions     Readmission Risk Interventions No flowsheet data found.

## 2020-09-07 NOTE — Progress Notes (Signed)
   09/07/20 1503  Assess: MEWS Score  Temp 98.8 F (37.1 C)  BP 128/78  Pulse Rate (!) 125  Resp 17  SpO2 93 %  O2 Device Nasal Cannula  Assess: MEWS Score  MEWS Temp 0  MEWS Systolic 0  MEWS Pulse 2  MEWS RR 0  MEWS LOC 0  MEWS Score 2  MEWS Score Color Yellow  Assess: if the MEWS score is Yellow or Red  Were vital signs taken at a resting state? Yes  Focused Assessment Change from prior assessment (see assessment flowsheet)  Does the patient meet 2 or more of the SIRS criteria? No  MEWS guidelines implemented *See Row Information* Yes  Treat  MEWS Interventions Administered scheduled meds/treatments;Escalated (See documentation below)  Pain Scale 0-10  Pain Score 7  Pain Type Chronic pain  Pain Location Knee  Take Vital Signs  Increase Vital Sign Frequency  Yellow: Q 2hr X 2 then Q 4hr X 2, if remains yellow, continue Q 4hrs  Escalate  MEWS: Escalate Yellow: discuss with charge nurse/RN and consider discussing with provider and RRT  Notify: Charge Nurse/RN  Name of Charge Nurse/RN Notified Shanda Bumps Christmas  Date Charge Nurse/RN Notified 09/07/20  Time Charge Nurse/RN Notified 1517  Notify: Provider  Provider Name/Title Zhang  Date Provider Notified 09/07/20  Time Provider Notified 1517  Document  Patient Outcome Other (Comment) (Continue to monitor after starting amio gtt)  Progress note created (see row info) Yes  Assess: SIRS CRITERIA  SIRS Temperature  0  SIRS Pulse 1  SIRS Respirations  0  SIRS WBC 0  SIRS Score Sum  1

## 2020-09-07 NOTE — Progress Notes (Signed)
SUBJECTIVE: Aaron Harvey is a 65 y.o. male with medical history significant of hypertension, hyperlipidemia, COPD (using as needed oxygen at night), GERD, gout, depression, anxiety, alcohol abuse, tobacco abuse, chronic back pain.   Patient now experiencing atrial fibrillation with RVR. Denies chest pain and shortness of breath.    Vitals:   09/07/20 0759 09/07/20 1129 09/07/20 1313 09/07/20 1503  BP: 123/77 (!) 144/111 (!) 84/59 128/78  Pulse: 97 91 73 (!) 125  Resp: 17 17  17   Temp: 98.8 F (37.1 C) 99.3 F (37.4 C)  98.8 F (37.1 C)  TempSrc: Oral Oral    SpO2: 100% 99% 97% 93%  Weight:      Height:        Intake/Output Summary (Last 24 hours) at 09/07/2020 1539 Last data filed at 09/07/2020 1515 Gross per 24 hour  Intake 1207.2 ml  Output 1287 ml  Net -79.8 ml    LABS: Basic Metabolic Panel: Recent Labs    09/06/20 0459 09/07/20 0506  NA 132* 130*  K 3.7 4.1  CL 91* 91*  CO2 27 31  GLUCOSE 101* 160*  BUN 16 15  CREATININE 0.99 0.73  CALCIUM 8.3* 8.4*   Liver Function Tests: No results for input(s): AST, ALT, ALKPHOS, BILITOT, PROT, ALBUMIN in the last 72 hours. No results for input(s): LIPASE, AMYLASE in the last 72 hours. CBC: Recent Labs    09/06/20 0459 09/07/20 0506  WBC 11.9* 9.4  NEUTROABS 9.2*  --   HGB 9.5* 9.6*  HCT 30.7* 32.2*  MCV 80.6 79.7*  PLT 309 362   Cardiac Enzymes: Recent Labs    09/06/20 0459 09/07/20 0506  CKTOTAL 2,244* 2,838*   BNP: Invalid input(s): POCBNP D-Dimer: No results for input(s): DDIMER in the last 72 hours. Hemoglobin A1C: Recent Labs    09/06/20 0842  HGBA1C 5.1   Fasting Lipid Panel: Recent Labs    09/07/20 0506  CHOL 150  HDL 41  LDLCALC 98  TRIG 53  CHOLHDL 3.7   Thyroid Function Tests: No results for input(s): TSH, T4TOTAL, T3FREE, THYROIDAB in the last 72 hours.  Invalid input(s): FREET3 Anemia Panel: Recent Labs    09/07/20 0506  TIBC 413  IRON 19*     PHYSICAL EXAM General:  Well developed, well nourished, in no acute distress HEENT:  Normocephalic and atramatic Neck:  No JVD.  Lungs: Clear bilaterally to auscultation and percussion. Heart: HRRR . Normal S1 and S2 without gallops or murmurs.  Abdomen: Bowel sounds are positive, abdomen soft and non-tender  Msk:  Back normal, normal gait. Normal strength and tone for age. Extremities: No clubbing, cyanosis or edema.   Neuro: Alert and oriented X 3. Psych:  Good affect, responds appropriately  TELEMETRY: atrial fibrillation, HR 123  ASSESSMENT AND PLAN: Patient on amiodarone, agree with plan.   Principal Problem:   Acute on chronic respiratory failure with hypoxia (HCC) Active Problems:   Hypertension   Acute urinary retention   Chronic pain syndrome   Gastroesophageal reflux disease without esophagitis   Tobacco abuse   Alcohol intoxication (HCC)   HLD (hyperlipidemia)   COPD exacerbation (HCC)   Fall   Sepsis (HCC)   Elevated troponin   Rhabdomyolysis   CAP (community acquired pneumonia)   Swelling of lower leg    Amber Scoggins, FNP-C 09/07/2020 3:39 PM

## 2020-09-07 NOTE — Progress Notes (Signed)
HR 140-160. MD Zhang notified and come to bedside. IV cardizem order and PO Cardizem order placed by MD and administered by this RN. HR remains 130-150 at this time. Will continue to monitor and keep MD up to date

## 2020-09-07 NOTE — NC FL2 (Signed)
[place] MEDICAID FL2 LEVEL OF CARE SCREENING TOOL     IDENTIFICATION  Patient Name: Aaron Harvey Birthdate: 04-Feb-1956 Sex: male Admission Date (Current Location): 09/04/2020  [facility] and [place] Number:  Chiropodist and Address:  [facility], [address] [place], [place] [address]      Provider Number: [id number]  Attending Physician Name and Address:  Marrion Coy, MD  Relative Name and Phone Number:       Current Level of Care: Hospital Recommended Level of Care: Skilled Nursing Facility Prior Approval Number:    Date Approved/Denied:   PASRR Number: [id number]  Discharge Plan: SNF    Current Diagnoses: Patient Active Problem List   Diagnosis Date Noted   Acute on chronic respiratory failure with hypoxia (HCC) 09/06/2020   Alcohol intoxication (HCC) 09/06/2020   HLD (hyperlipidemia) 09/06/2020   COPD exacerbation (HCC) 09/06/2020   Fall 09/06/2020   Sepsis (HCC) 09/06/2020   Elevated troponin 09/06/2020   Rhabdomyolysis 09/06/2020   CAP (community acquired pneumonia) 09/06/2020   Swelling of lower leg 09/06/2020   Hypotension    Chronic pain syndrome 08/31/2020   Bilateral lower extremity edema 08/31/2020   Gastroesophageal reflux disease without esophagitis 08/31/2020   Depression, major, recurrent, mild (HCC) 08/31/2020   Primary osteoarthritis involving multiple joints 08/31/2020   Centrilobular emphysema (HCC) 08/31/2020   Tobacco abuse 08/31/2020   Alcohol consumption binge drinking 06/06/2020   COPD (chronic obstructive pulmonary disease) (HCC) 06/05/2020   Gout 06/05/2020   Closed displaced spiral fracture of shaft of right tibia 06/05/2020   Accidental fall 06/05/2020   Acute urinary retention 06/05/2020   Hypertension 12/21/2009    Orientation RESPIRATION BLADDER Height & Weight     Time, Self, Situation, Place  O2 (NC2L) Incontinent Weight: 200 lb 9.9 oz (91 kg) (pt came in bed cant stand not sure if  bed was zero out KC) Height:  5\' 8"  (172.7 cm)  BEHAVIORAL SYMPTOMS/MOOD NEUROLOGICAL BOWEL NUTRITION STATUS      Continent Diet (heart healthy, thin liquids)  AMBULATORY STATUS COMMUNICATION OF NEEDS Skin   Limited Assist Verbally Normal                       Personal Care Assistance Level of Assistance  Dressing, Feeding, Bathing Bathing Assistance: Limited assistance Feeding assistance: Independent Dressing Assistance: Limited assistance     Functional Limitations Info  Sight, Hearing, Speech Sight Info: Adequate Hearing Info: Adequate Speech Info: Adequate    SPECIAL CARE FACTORS FREQUENCY  PT (By licensed PT), OT (By licensed OT)     PT Frequency: 5x OT Frequency: 5x            Contractures Contractures Info: Not present    Additional Factors Info  Code Status, Allergies Code Status Info: full Allergies Info: no known allergies           Current Medications (09/07/2020):  This is the current hospital active medication list Current Facility-Administered Medications  Medication Dose Route Frequency Provider Last Rate Last Admin   acetaminophen (TYLENOL) tablet 650 mg  650 mg Oral Q6H PRN 11/08/2020, MD       albuterol (PROVENTIL) (2.5 MG/3ML) 0.083% nebulizer solution 2.5 mg  2.5 mg Nebulization Q4H PRN Lorretta Harp, MD       aspirin EC tablet 81 mg  81 mg Oral Daily Lorretta Harp, MD   81 mg at 09/07/20 0830   Chlorhexidine Gluconate Cloth 2 % PADS 6 each  6 each Topical Daily Lorretta Harp, MD   6 each at 09/07/20 1124   dextromethorphan-guaiFENesin (MUCINEX DM) 30-600 MG per 12 hr tablet 1 tablet  1 tablet Oral BID PRN Lorretta Harp, MD       digoxin (LANOXIN) 0.25 MG/ML injection 0.25 mg  0.25 mg Intravenous Once Marrion Coy, MD       diltiazem (CARDIZEM CD) 24 hr capsule 240 mg  240 mg Oral Daily Marrion Coy, MD   240 mg at 09/07/20 1228   enoxaparin (LOVENOX) injection 50 mg  0.5 mg/kg Subcutaneous Q24H Lorretta Harp, MD   50 mg at 09/06/20 2104   furosemide  (LASIX) injection 40 mg  40 mg Intravenous BID Marrion Coy, MD       gabapentin (NEURONTIN) capsule 600 mg  600 mg Oral TID Lorretta Harp, MD   600 mg at 09/07/20 0830   ipratropium-albuterol (DUONEB) 0.5-2.5 (3) MG/3ML nebulizer solution 3 mL  3 mL Nebulization Q6H Lorretta Harp, MD   3 mL at 09/07/20 1318   loratadine (CLARITIN) tablet 10 mg  10 mg Oral Daily Lorretta Harp, MD   10 mg at 09/07/20 0830   LORazepam (ATIVAN) injection 0-4 mg  0-4 mg Intravenous Q12H Lorretta Harp, MD       Or   LORazepam (ATIVAN) tablet 0-4 mg  0-4 mg Oral Q12H Lorretta Harp, MD       methocarbamol (ROBAXIN) tablet 500 mg  500 mg Oral Q6H PRN Lorretta Harp, MD   500 mg at 09/07/20 1124   nicotine (NICODERM CQ - dosed in mg/24 hours) patch 21 mg  21 mg Transdermal Daily Lorretta Harp, MD   21 mg at 09/07/20 0831   ondansetron (ZOFRAN) injection 4 mg  4 mg Intravenous Q8H PRN Lorretta Harp, MD       oxyCODONE-acetaminophen (PERCOCET/ROXICET) 5-325 MG per tablet 1 tablet  1 tablet Oral Q4H PRN Lorretta Harp, MD   1 tablet at 09/07/20 1313   pantoprazole (PROTONIX) EC tablet 40 mg  40 mg Oral Daily Lorretta Harp, MD   40 mg at 09/07/20 0830   PARoxetine (PAXIL) tablet 10 mg  10 mg Oral Daily Lorretta Harp, MD   10 mg at 09/07/20 0830   pravastatin (PRAVACHOL) tablet 40 mg  40 mg Oral QHS Lorretta Harp, MD   40 mg at 09/06/20 2104   tamsulosin (FLOMAX) capsule 0.4 mg  0.4 mg Oral Daily Marrion Coy, MD   0.4 mg at 09/07/20 1126   thiamine tablet 100 mg  100 mg Oral Daily Lorretta Harp, MD   100 mg at 09/07/20 0830   Or   thiamine (B-1) injection 100 mg  100 mg Intravenous Daily Lorretta Harp, MD         Discharge Medications: Please see discharge summary for a list of discharge medications.  Relevant Imaging Results:  Relevant Lab Results:   Additional Information [id number]  Maree Krabbe, LCSW

## 2020-09-07 NOTE — Progress Notes (Addendum)
PT Cancellation Note  Patient Details Name: Aaron Harvey MRN: [id number] DOB: 01-Sep-1955   Cancelled Treatment:     Per chart review and discussion with RN. Will hold PT today 2/2 to poorly controlled HR. Medication issued. Will return tomorrow and continue to follow per POC.     Rushie Chestnut 09/07/2020, 1:06 PM

## 2020-09-07 NOTE — Progress Notes (Addendum)
PROGRESS NOTE    Aaron Harvey  ORV:[id number] DOB: 14-Jul-1955 DOA: 09/04/2020 PCP: Pcp, No   Chief complaint.  Shortness of breath. Brief Narrative:  Aaron Harvey is a 65 y.o. male with medical history significant of hypertension, hyperlipidemia, COPD (using as needed oxygen at night), GERD, gout, depression, anxiety, alcohol abuse, tobacco abuse, chronic back pain, who presents with fall, alcohol intoxication in the short breath. Upon arriving here in the emergency room, he was intoxicated with alcohol level 254, elevated CK at 2244.  Checks x-ray showed mild bilateral basilar subsegmental opacity.  Procalcitonin level less than 0.1.  Her, BNP was elevated at 1799.  Patient is placed on antibiotics for possible pneumonia.   Assessment & Plan:   Principal Problem:   Acute on chronic respiratory failure with hypoxia (HCC) Active Problems:   Hypertension   Acute urinary retention   Chronic pain syndrome   Gastroesophageal reflux disease without esophagitis   Tobacco abuse   Alcohol intoxication ([place])   HLD (hyperlipidemia)   COPD exacerbation (HCC)   Fall   Sepsis ([place])   Elevated troponin   Rhabdomyolysis   CAP (community acquired pneumonia)   Swelling of lower leg  #1.  Acute on chronic respiratory failure with hypoxemia. Acute on chronic diastolic congestive heart failure. Aspiration pneumonitis. Sepsis criteria met. Patient condition is more consistent with acute on chronic diastolic congestive heart failure.  Patient has had echocardiogram done in 06/2020, showed ejection fraction 65 to 70%.  But he has a profound elevation of BNP.  Evidence of volume overload.  We will start IV Lasix 40 mg IV twice a day. Patient procalcitonin level 0.1, will discontinue antibiotics. Patient also has history of altered mental status, history of dysphagia.  Possibility of aspiration pneumonitis.  This could be the source of her sepsis.  Will obtain speech therapy evaluation. Patient does not  have bronchospasm, discontinued IV steroids.   #2.  Alcohol abuse. Hyponatremia. On CIWA protocol and thiamine folic acid.  Advised to quit. Will monitor sodium level with IV Lasix.  #3.  Mild elevation troponin. Rhabdomyolysis. Secondary to fall, continue to follow.  #4.  Anemia. Check iron B12 level.  Negative for DVT in bilateral lower extremity.  1214.  Patient developed atrial fibrillation with rapid ventricular response.  Heart rate at 140s.  I will give IV diltiazem.  Start oral diltiazem. May give digoxin if HR still high.  Received 1 dose of 0.25 mg of digoxin, will give another dose of 0.25 mg at heart rate still fast.  We will also load with amiodarone as well as the infusion.  Consult cardiology.  Dr. Humphrey Rolls notified.  DVT prophylaxis: Lovenox Code Status: full Family Communication:  Disposition Plan:    Status is: Inpatient  Remains inpatient appropriate because:IV treatments appropriate due to intensity of illness or inability to take PO and Inpatient level of care appropriate due to severity of illness  Dispo: The patient is from: Home              Anticipated d/c is to: Home              Patient currently is not medically stable to d/c.   Difficult to place patient No        I/O last 3 completed shifts: In: 1861.9 [P.O.:360; IV Piggyback:1501.9] Out: 7614 [Urine:1112] Total I/O In: 240 [P.O.:240] Out: -      Consultants:  None  Procedures: None  Antimicrobials: None   Subjective: Patient mild confusion,  disoriented to time.  No agitation.  No tremor. Short of breath with exertion, no wheezing. No chest pain or palpitation. No fever or chills. No dysuria hematuria No headache or dizziness.  Objective: Vitals:   09/07/20 0023 09/07/20 0346 09/07/20 0416 09/07/20 0759  BP: 124/79 (!) 145/74 95/65 123/77  Pulse: (!) 101 (!) 101 95 97  Resp:    17  Temp: 98.6 F (37 C) 98.3 F (36.8 C) 98.2 F (36.8 C) 98.8 F (37.1 C)  TempSrc:  Oral Oral Oral Oral  SpO2: 99% 96% 95% 100%  Weight:      Height:        Intake/Output Summary (Last 24 hours) at 09/07/2020 1052 Last data filed at 09/07/2020 1000 Gross per 24 hour  Intake 703.33 ml  Output 512 ml  Net 191.33 ml   Filed Weights   09/04/20 1513 09/06/20 1358  Weight: 98.9 kg 91 kg    Examination:  General exam: Appears calm and comfortable  Respiratory system: Crackles in the base. Respiratory effort normal. Cardiovascular system: S1 & S2 heard, RRR. No JVD, murmurs, rubs, gallops or clicks.  Gastrointestinal system: Abdomen is nondistended, soft and nontender. No organomegaly or masses felt. Normal bowel sounds heard. Central nervous system: Alert and oriented x1. No focal neurological deficits. Extremities: 1+ leg edema Skin: No rashes, lesions or ulcers Psychiatry: Judgement and insight appear normal. Mood & affect appropriate.     Data Reviewed: I have personally reviewed following labs and imaging studies  CBC: Recent Labs  Lab 09/04/20 1518 09/06/20 0459 09/07/20 0506  WBC 6.3 11.9* 9.4  NEUTROABS  --  9.2*  --   HGB 9.7* 9.5* 9.6*  HCT 31.3* 30.7* 32.2*  MCV 79.6* 80.6 79.7*  PLT 286 309 150   Basic Metabolic Panel: Recent Labs  Lab 09/04/20 1518 09/06/20 0459 09/07/20 0506  NA 129* 132* 130*  K 4.1 3.7 4.1  CL 96* 91* 91*  CO2 _0 GLUCOSE 94 101* 160*  BUN 7* 16 15  CREATININE 0.68 0.99 0.73  CALCIUM 8.3* 8.3* 8.4*   GFR: Estimated Creatinine Clearance: 100.8 mL/min (by C-G formula based on SCr of 0.73 mg/dL). Liver Function Tests: Recent Labs  Lab 09/04/20 1518  AST 20  ALT 10  ALKPHOS 54  BILITOT 0.5  PROT 6.9  ALBUMIN 3.4*   No results for input(s): LIPASE, AMYLASE in the last 168 hours. No results for input(s): AMMONIA in the last 168 hours. Coagulation Profile: Recent Labs  Lab 09/06/20 0842  INR 1.1   Cardiac Enzymes: Recent Labs  Lab 09/04/20 1518 09/06/20 0459 09/07/20 0506  CKTOTAL 629* 2,244*  2,838*   BNP (last 3 results) No results for input(s): PROBNP in the last 8760 hours. HbA1C: Recent Labs    09/06/20 0842  HGBA1C 5.1   CBG: Recent Labs  Lab 09/06/20 1733 09/06/20 2121 09/07/20 0802  GLUCAP 145* 148* 161*   Lipid Profile: Recent Labs    09/07/20 0506  CHOL 150  HDL 41  LDLCALC 98  TRIG 53  CHOLHDL 3.7   Thyroid Function Tests: No results for input(s): TSH, T4TOTAL, FREET4, T3FREE, THYROIDAB in the last 72 hours. Anemia Panel: No results for input(s): VITAMINB12, FOLATE, FERRITIN, TIBC, IRON, RETICCTPCT in the last 72 hours. Sepsis Labs: Recent Labs  Lab 09/06/20 0459 09/06/20 0842 09/06/20 1437  PROCALCITON <0.10  --   --   LATICACIDVEN 1.3 0.9 1.3    Recent Results (from the past 240 hour(s))  Resp Panel by RT-PCR (Flu A&B, Covid) Nasopharyngeal Swab     Status: None   Collection Time: 09/04/20  3:20 PM   Specimen: Nasopharyngeal Swab; Nasopharyngeal(NP) swabs in vial transport medium  Result Value Ref Range Status   SARS Coronavirus 2 by RT PCR NEGATIVE NEGATIVE Final    Comment: (NOTE) SARS-CoV-2 target nucleic acids are NOT DETECTED.  The SARS-CoV-2 RNA is generally detectable in upper respiratory specimens during the acute phase of infection. The lowest concentration of SARS-CoV-2 viral copies this assay can detect is 138 copies/mL. A negative result does not preclude SARS-Cov-2 infection and should not be used as the sole basis for treatment or other patient management decisions. A negative result may occur with  improper specimen collection/handling, submission of specimen other than nasopharyngeal swab, presence of viral mutation(s) within the areas targeted by this assay, and inadequate number of viral copies(<138 copies/mL). A negative result must be combined with clinical observations, patient history, and epidemiological information. The expected result is Negative.  Fact Sheet for Patients:   EntrepreneurPulse.com.au  Fact Sheet for Healthcare Providers:  IncredibleEmployment.be  This test is no t yet approved or cleared by the [place] FDA and  has been authorized for detection and/or diagnosis of SARS-CoV-2 by FDA under an Emergency Use Authorization (EUA). This EUA will remain  in effect (meaning this test can be used) for the duration of the COVID-19 declaration under Section 564(b)(1) of the Act, 21 U.S.C.section 360bbb-3(b)(1), unless the authorization is terminated  or revoked sooner.       Influenza A by PCR NEGATIVE NEGATIVE Final   Influenza B by PCR NEGATIVE NEGATIVE Final    Comment: (NOTE) The Xpert Xpress SARS-CoV-2/FLU/RSV plus assay is intended as an aid in the diagnosis of influenza from Nasopharyngeal swab specimens and should not be used as a sole basis for treatment. Nasal washings and aspirates are unacceptable for Xpert Xpress SARS-CoV-2/FLU/RSV testing.  Fact Sheet for Patients: EntrepreneurPulse.com.au  Fact Sheet for Healthcare Providers: IncredibleEmployment.be  This test is not yet approved or cleared by the [place] FDA and has been authorized for detection and/or diagnosis of SARS-CoV-2 by FDA under an Emergency Use Authorization (EUA). This EUA will remain in effect (meaning this test can be used) for the duration of the COVID-19 declaration under Section 564(b)(1) of the Act, 21 U.S.C. section 360bbb-3(b)(1), unless the authorization is terminated or revoked.  Performed at [facility], [address] [place], [place] [address]   Blood culture (routine x 2)     Status: None (Preliminary result)   Collection Time: 09/06/20  7:54 AM   Specimen: BLOOD  Result Value Ref Range Status   Specimen Description BLOOD RIGHT HAND  Final   Special Requests   Final    BOTTLES DRAWN AEROBIC AND ANAEROBIC Blood Culture results may not be optimal due to  an inadequate volume of blood received in culture bottles   Culture   Final    NO GROWTH 1 DAY Performed at [facility], [address], [place], [place] [address]    Report Status PENDING  Incomplete  Blood culture (routine x 2)     Status: None (Preliminary result)   Collection Time: 09/06/20  7:55 AM   Specimen: BLOOD  Result Value Ref Range Status   Specimen Description BLOOD RIGHT ANTECUBITAL  Final   Special Requests   Final    BOTTLES DRAWN AEROBIC AND ANAEROBIC Blood Culture results may not be optimal due to an inadequate volume  of blood received in culture bottles   Culture   Final    NO GROWTH 1 DAY Performed at [facility], [address] [place], [place] [address]    Report Status PENDING  Incomplete         Radiology Studies: US Venous Img Lower Bilateral (DVT)  Result Date: 09/06/2020 CLINICAL DATA:  Bilateral lower extremity edema EXAM: BILATERAL LOWER EXTREMITY VENOUS DOPPLER ULTRASOUND TECHNIQUE: Gray-scale sonography with graded compression, as well as color Doppler and duplex ultrasound were performed to evaluate the lower extremity deep venous systems from the level of the common femoral vein and including the common femoral, femoral, profunda femoral, popliteal and calf veins including the posterior tibial, peroneal and gastrocnemius veins when visible. The superficial great saphenous vein was also interrogated. Spectral Doppler was utilized to evaluate flow at rest and with distal augmentation maneuvers in the common femoral, femoral and popliteal veins. COMPARISON:  None. FINDINGS: RIGHT LOWER EXTREMITY Common Femoral Vein: No evidence of thrombus. Normal compressibility, respiratory phasicity and response to augmentation. Saphenofemoral Junction: No evidence of thrombus. Normal compressibility and flow on color Doppler imaging. Profunda Femoral Vein: No evidence of thrombus. Normal compressibility and flow on color Doppler imaging. Femoral  Vein: No evidence of thrombus. Normal compressibility, respiratory phasicity and response to augmentation. Popliteal Vein: No evidence of thrombus. Normal compressibility, respiratory phasicity and response to augmentation. Calf Veins: No evidence of thrombus. Normal compressibility and flow on color Doppler imaging. Superficial Great Saphenous Vein: No evidence of thrombus. Normal compressibility. Venous Reflux:  None. Other Findings:  None. LEFT LOWER EXTREMITY Common Femoral Vein: No evidence of thrombus. Normal compressibility, respiratory phasicity and response to augmentation. Saphenofemoral Junction: No evidence of thrombus. Normal compressibility and flow on color Doppler imaging. Profunda Femoral Vein: No evidence of thrombus. Normal compressibility and flow on color Doppler imaging. Femoral Vein: No evidence of thrombus. Normal compressibility, respiratory phasicity and response to augmentation. Popliteal Vein: No evidence of thrombus. Normal compressibility, respiratory phasicity and response to augmentation. Calf Veins: No evidence of thrombus. Normal compressibility and flow on color Doppler imaging. Superficial Great Saphenous Vein: No evidence of thrombus. Normal compressibility. Venous Reflux:  None. Other Findings:  None. IMPRESSION: No evidence of deep venous thrombosis in either lower extremity. Electronically Signed   By: Jacqulynn Cadet M.D.   On: 09/06/2020 21:09        Scheduled Meds:  aspirin EC  81 mg Oral Daily   Chlorhexidine Gluconate Cloth  6 each Topical Daily   enoxaparin (LOVENOX) injection  0.5 mg/kg Subcutaneous Q24H   furosemide  40 mg Intravenous BID   gabapentin  600 mg Oral TID   ipratropium-albuterol  3 mL Nebulization Q6H   loratadine  10 mg Oral Daily   LORazepam  0-4 mg Intravenous Q6H   Or   LORazepam  0-4 mg Oral Q6H   LORazepam  0-4 mg Intravenous Q12H   Or   LORazepam  0-4 mg Oral Q12H   methylPREDNISolone (SOLU-MEDROL) injection  40 mg Intravenous  Q12H   nicotine  21 mg Transdermal Daily   pantoprazole  40 mg Oral Daily   PARoxetine  10 mg Oral Daily   pravastatin  40 mg Oral QHS   thiamine  100 mg Oral Daily   Or   thiamine  100 mg Intravenous Daily   Continuous Infusions:  azithromycin     cefTRIAXone (ROCEPHIN)  IV Stopped (09/06/20 0825)     LOS: 1 day    Time spent: 67  minutes    Sharen Hones, MD [facility]   To contact the attending provider between 7A-7P or the covering provider during after hours 7P-7A, please log into the web site [email or web address] and access using universal [place] password for that web site. If you do not have the password, please call the hospital operator.  09/07/2020, 10:52 AM

## 2020-09-07 NOTE — Evaluation (Signed)
Clinical/Bedside Swallow Evaluation Patient Details  Name: Aaron Harvey MRN: [id number] Date of Birth: 1956/01/13  Today's Date: 09/07/2020 Time: SLP Start Time (ACUTE ONLY): 0845 SLP Stop Time (ACUTE ONLY): 0900 SLP Time Calculation (min) (ACUTE ONLY): 15 min  Past Medical History:  Past Medical History:  Diagnosis Date   Alcohol addiction (HCC)    Anxiety    COPD (chronic obstructive pulmonary disease) (HCC)    Depression    Gout    Hyperlipidemia    Hypertension    Past Surgical History:  Past Surgical History:  Procedure Laterality Date   BACK SURGERY     CARDIAC SURGERY     CERVICAL DISC SURGERY     TIBIA IM NAIL INSERTION Right 06/08/2020   Procedure: INTRAMEDULLARY (IM) NAIL TIBIAL;  Surgeon: Kennedy Bucker, MD;  Location: [facility] ORS;  Service: Orthopedics;  Laterality: Right;   HPI:  Aaron Harvey is a 65 y.o. male with medical history significant of hypertension, hyperlipidemia, COPD (using as needed oxygen at night), GERD, gout, depression, anxiety, alcohol abuse, tobacco abuse, chronic back pain, who presents with fall, alcohol intoxication in the short breath. Chest x-ray revealed mild bibasilar subsegmental atelectasis or infiltrates are noted.   Assessment / Plan / Recommendation Clinical Impression  Pt found in bed attempting to eat breakfast while reclined at 45 degrees with plate resting on his abdomin. Pt's legs are contracted which makes it difficult for pt to sit upright. Bed placed in downward incline in effort for pt to be more upright. Pillow placed on pt's abdomen to aid in visualizing his meal when self-feeding. This positioning is not ideal and increases pt's overall aspiration risk, however it appears the only way in which pt can eat. Pt demonstrated adequate oropharyngeal abilities when consuming regular diet items with thin liquids via straw. Pt's oral phase was adequate with the appearance of effective mastication and complete oral clearing post swallow. Pt's  pharyngeal phase appeared swift with no overt s/s of aspiration. At this time, recommend continuing current diet with strict aspiration precautions. St intervention doesn't appear warranted at this time. SLP Visit Diagnosis: Dysphagia, unspecified (R13.10)    Aspiration Risk  Mild aspiration risk;Moderate aspiration risk    Diet Recommendation Regular;Thin liquid   Liquid Administration via: Straw Medication Administration: Whole meds with liquid Supervision: Patient able to self feed;Intermittent supervision to cue for compensatory strategies Compensations: Minimize environmental distractions;Slow rate;Small sips/bites Postural Changes:  (pt in inverted position)    Other  Recommendations Oral Care Recommendations: Oral care BID   Follow up Recommendations None      Frequency and Duration   N/A         Prognosis   N/A     Swallow Study   General Date of Onset: 09/07/20 HPI: Aaron Harvey is a 65 y.o. male with medical history significant of hypertension, hyperlipidemia, COPD (using as needed oxygen at night), GERD, gout, depression, anxiety, alcohol abuse, tobacco abuse, chronic back pain, who presents with fall, alcohol intoxication in the short breath. Chest x-ray revealed mild bibasilar subsegmental atelectasis or infiltrates are noted. Type of Study: Bedside Swallow Evaluation Previous Swallow Assessment: none in chart Diet Prior to this Study: Regular;Thin liquids Temperature Spikes Noted: No Respiratory Status: Nasal cannula History of Recent Intubation: No Behavior/Cognition: Alert;Cooperative;Pleasant mood Oral Cavity Assessment: Within Functional Limits Oral Care Completed by SLP: No Oral Cavity - Dentition: Adequate natural dentition Vision: Functional for self-feeding Self-Feeding Abilities: Able to feed self;Needs set up Patient Positioning: Postural control  adequate for testing Baseline Vocal Quality: Normal Volitional Cough: Strong Volitional Swallow: Able  to elicit    Oral/Motor/Sensory Function Overall Oral Motor/Sensory Function: Within functional limits   Ice Chips Ice chips: Not tested   Thin Liquid Thin Liquid: Within functional limits Presentation: Self Fed;Straw    Nectar Thick Nectar Thick Liquid: Not tested   Honey Thick Honey Thick Liquid: Not tested   Puree Puree: Within functional limits Presentation: Self Fed;Spoon   Solid     Solid: Within functional limits Presentation: Self Fed;Spoon     Aaron Harvey B. Dreama Saa M.S., CCC-SLP, [facility] Speech-Language Pathologist Rehabilitation Services Office [phone]  Nilay Mangrum Dreama Saa 09/07/2020,4:00 PM

## 2020-09-07 NOTE — Progress Notes (Signed)
Pt is alert to self, location and date. Not sure why he is here. CIWA has been 2 and 1 tonight. Slept well. Two complaints of pain, relieved with norco. Bladder scanned around 0130, only 240 ml. MD notified who ordered an I&O. Done with 300 ml out and 12 ml post scan. 2L [place]. NSR on the monitor. IV in place

## 2020-09-07 NOTE — Progress Notes (Signed)
HR remains 138-160. MD Zhang placed new order for Digoxin at this time.

## 2020-09-07 NOTE — Progress Notes (Signed)
Amio gtt started with 150mg  Amio loading dose. Patient continues to be asymptomatic at this time.

## 2020-09-07 NOTE — Progress Notes (Addendum)
Patient converted back to sinus rhythm with rate in the 80s. MD Zhang notified via secure chat.   MD Zhang ordered to continue amio gtt until tomorrow, passes along to night shift.

## 2020-09-07 NOTE — Evaluation (Signed)
Occupational Therapy Evaluation Patient Details Name: Aaron Harvey MRN: [id number] DOB: 1955-12-29 Today's Date: 09/07/2020    History of Present Illness Patient is a 65 y.o. male with a past medical history of alcohol abuse, COPD, anxiety, hypertension, hyperlipidemia, presents to the emergency department for a fall/weakness/alcohol use, inability to care for himself.   Clinical Impression   Aaron Harvey presents today with generalized weakness, limited endurance, and 8/10 pain, located this AM primarily in his R knee. PTA, pt states that he spends almost all his time in bed, watching TV. He is unable to get through the bathroom door at his home, so uses a [facility] for toileting, takes sponge baths at the kitchen sink. He lives with his niece and nephew, who take care of household tasks, financial mgmt, driving, shopping, cooking. Other than attending medical appts, Aaron Harvey reports that he almost never leaves his home. Today he is able to engage in bed mobility with labored effort and increased time, good balance in sitting. He declined transferring to chair, stating that he is only comfortable sitting in his WC, which is not present here at hospital. Able to participate in limited (~ 30 degrees) knee extension, as well as ankle pumps, rotation, but activity very limited 2/2 pain and hypertonia. Recommend ongoing OT while pt is hospitalized, as well as DC to STR, to assist pt in addressing pain, weakness, tone, allowing him to improve his functional mobility and engage in more OOB activity.     Follow Up Recommendations  SNF    Equipment Recommendations       Recommendations for Other Services       Precautions / Restrictions Precautions Precautions: Fall Precaution Comments: uses wc for OOB activity Restrictions Weight Bearing Restrictions: No      Mobility Bed Mobility Overal bed mobility: Needs Assistance Bed Mobility: Supine to Sit;Sit to Supine;Rolling Rolling: Modified independent  (Device/Increase time)   Supine to sit: Min guard Sit to supine: Min guard   General bed mobility comments: Pt able to perform bed mobility with increased time and labored effort, but no physical assist    Transfers                 General transfer comment: Pt declined    Balance Overall balance assessment: Needs assistance Sitting-balance support: Feet supported;No upper extremity supported;Bilateral upper extremity supported Sitting balance-Leahy Scale: Good Sitting balance - Comments: able to maintain good seated balance at EOB for > 10 minutes, not comfortable reaching outside BOS     Standing balance-Leahy Scale: Zero Standing balance comment: pt states he has not attempted standing in "several years"                           ADL either performed or assessed with clinical judgement   ADL Overall ADL's : Needs assistance/impaired                     Lower Body Dressing: Maximal assistance                       Vision Patient Visual Report: No change from baseline       Perception     Praxis      Pertinent Vitals/Pain Pain Assessment: 0-10 Pain Score: 8  Pain Location: R knee Pain Descriptors / Indicators: Burning;Aching;Sore Pain Intervention(s): Limited activity within patient's tolerance;Monitored during session;Premedicated before session;Repositioned;Utilized relaxation techniques     Hand  Dominance Right   Extremity/Trunk Assessment Upper Extremity Assessment Upper Extremity Assessment: Overall WFL for tasks assessed   Lower Extremity Assessment Lower Extremity Assessment: RLE deficits/detail;LLE deficits/detail RLE Deficits / Details: hyperteonicity vs flexion contracture of hip and knee. heels are touching buttocks while laying in bed. pain with PROM efforts. redness and edema noted on top of feet RLE: Unable to fully assess due to pain LLE Deficits / Details: hyperteonicity vs flexion contracture of hip and knee.  heels are touching buttocks while laying in bed. pain with PROM effort. redness and edema noted on top of feet LLE: Unable to fully assess due to pain       Communication Communication Communication: No difficulties   Cognition Arousal/Alertness: Awake/alert Behavior During Therapy: WFL for tasks assessed/performed Overall Cognitive Status: Within Functional Limits for tasks assessed                                     General Comments  b/l edema, redness. Wounds, abrasions on toes, b/l knees    Exercises Other Exercises Other Exercises: Bed mobility, sitting balance tolerance, lower body ROM, repositioning, relaxation techniques for pain management   Shoulder Instructions      Home Living Family/patient expects to be discharged to:: Private residence Living Arrangements: Other relatives (niece and nephew) Available Help at Discharge: Family Type of Home: Mobile home Home Access: Ramped entrance     Home Layout: One level     Bathroom Shower/Tub: None (pt is unable to get into the bathroom with his WC)         Home Equipment: Wheelchair - manual;Bedside commode;Hospital bed          Prior Functioning/Environment Level of Independence: Independent with assistive device(s)        Comments: patient has not walked in "a couple years". wheelchair is primary means of mobility. uses UE and LE to propel the wheelchair independently and does a scooting transfer. patient gives himself a sponge bath without assistance from family. family cooks        OT Problem List: Decreased strength;Decreased range of motion;Decreased activity tolerance;Impaired balance (sitting and/or standing);Pain;Impaired tone;Increased edema      OT Treatment/Interventions: Self-care/ADL training;Therapeutic exercise;Patient/family education;Balance training;Energy conservation;DME and/or AE instruction;Therapeutic activities    OT Goals(Current goals can be found in the care plan  section) Acute Rehab OT Goals Patient Stated Goal: to be more comfortable OT Goal Formulation: With patient Time For Goal Achievement: 09/21/20 Potential to Achieve Goals: Good ADL Goals Pt Will Perform Lower Body Dressing: with modified independence;sitting/lateral leans Pt Will Transfer to Toilet: with modified independence;bedside commode (using LRAD) Pt/caregiver will Perform Home Exercise Program: Increased ROM;With theraband;Independently (to increase flexibility and relieve pain) Additional ADL Goal #1: Pt will be able to identify/describe 2+ non-pharmaceutical pain mgmt techniques  OT Frequency: Min 1X/week   Barriers to D/C:            Co-evaluation              AM-PAC OT "6 Clicks" Daily Activity     Outcome Measure Help from another person eating meals?: None Help from another person taking care of personal grooming?: A Little Help from another person toileting, which includes using toliet, bedpan, or urinal?: A Little Help from another person bathing (including washing, rinsing, drying)?: A Little Help from another person to put on and taking off regular upper body clothing?: A Little Help from  another person to put on and taking off regular lower body clothing?: A Lot 6 Click Score: 18   End of Session Equipment Utilized During Treatment: Oxygen  Activity Tolerance: Patient tolerated treatment well;Patient limited by pain Patient left: in bed;with call bell/phone within reach;with bed alarm set  OT Visit Diagnosis: Other abnormalities of gait and mobility (R26.89);Repeated falls (R29.6);Muscle weakness (generalized) (M62.81);Pain Pain - Right/Left: Right                Time: 1000-1053 OT Time Calculation (min): 53 min Charges:  OT General Charges $OT Visit: 1 Visit OT Evaluation $OT Eval Moderate Complexity: 1 Mod OT Treatments $Self Care/Home Management : 38-52 mins Latina Craver, PhD, MS, OTR/L 09/07/20, 12:52 PM

## 2020-09-08 DIAGNOSIS — J9621 Acute and chronic respiratory failure with hypoxia: Secondary | ICD-10-CM

## 2020-09-08 LAB — CBC WITH DIFFERENTIAL/PLATELET
Abs Immature Granulocytes: 0.06 10*3/uL (ref 0.00–0.07)
Basophils Absolute: 0 10*3/uL (ref 0.0–0.1)
Basophils Relative: 0 %
Eosinophils Absolute: 0 10*3/uL (ref 0.0–0.5)
Eosinophils Relative: 0 %
HCT: 29.9 % — ABNORMAL LOW (ref 39.0–52.0)
Hemoglobin: 9 g/dL — ABNORMAL LOW (ref 13.0–17.0)
Immature Granulocytes: 1 %
Lymphocytes Relative: 14 %
Lymphs Abs: 1.3 10*3/uL (ref 0.7–4.0)
MCH: 24.2 pg — ABNORMAL LOW (ref 26.0–34.0)
MCHC: 30.1 g/dL (ref 30.0–36.0)
MCV: 80.4 fL (ref 80.0–100.0)
Monocytes Absolute: 0.9 10*3/uL (ref 0.1–1.0)
Monocytes Relative: 9 %
Neutro Abs: 7.5 10*3/uL (ref 1.7–7.7)
Neutrophils Relative %: 76 %
Platelets: 361 10*3/uL (ref 150–400)
RBC: 3.72 MIL/uL — ABNORMAL LOW (ref 4.22–5.81)
RDW: 16 % — ABNORMAL HIGH (ref 11.5–15.5)
WBC: 9.8 10*3/uL (ref 4.0–10.5)
nRBC: 0 % (ref 0.0–0.2)

## 2020-09-08 LAB — BASIC METABOLIC PANEL
Anion gap: 5 (ref 5–15)
BUN: 18 mg/dL (ref 8–23)
CO2: 34 mmol/L — ABNORMAL HIGH (ref 22–32)
Calcium: 8.3 mg/dL — ABNORMAL LOW (ref 8.9–10.3)
Chloride: 89 mmol/L — ABNORMAL LOW (ref 98–111)
Creatinine, Ser: 0.77 mg/dL (ref 0.61–1.24)
GFR, Estimated: >60 mL/min (ref >60)
Glucose, Bld: 156 mg/dL — ABNORMAL HIGH (ref 70–99)
Potassium: 3.8 mmol/L (ref 3.5–5.1)
Sodium: 128 mmol/L — ABNORMAL LOW (ref 135–145)

## 2020-09-08 LAB — CK: Total CK: 1196 U/L — ABNORMAL HIGH (ref 49–397)

## 2020-09-08 LAB — MAGNESIUM: Magnesium: 1.7 mg/dL (ref 1.7–2.4)

## 2020-09-08 MED ORDER — AMIODARONE HCL 200 MG PO TABS
400.0000 mg | ORAL_TABLET | Freq: Every day | ORAL | Status: DC
  Administered 2020-09-08 – 2020-09-10 (×3): 400 mg via ORAL
  Filled 2020-09-08 (×3): qty 2

## 2020-09-08 MED ORDER — MAGNESIUM SULFATE 2 GM/50ML IV SOLN
2.0000 g | Freq: Once | INTRAVENOUS | Status: AC
  Administered 2020-09-08: 2 g via INTRAVENOUS
  Filled 2020-09-08: qty 50

## 2020-09-08 MED ORDER — POTASSIUM CHLORIDE CRYS ER 20 MEQ PO TBCR
40.0000 meq | EXTENDED_RELEASE_TABLET | Freq: Once | ORAL | Status: AC
  Administered 2020-09-08: 40 meq via ORAL
  Filled 2020-09-08: qty 2

## 2020-09-08 NOTE — Progress Notes (Addendum)
Occupational Therapy Treatment Patient Details Name: Aaron Harvey MRN: [id number] DOB: [birth year] Today's Date: 09/08/2020    History of present illness Patient is a 65 y.o. male with a past medical history of alcohol abuse, COPD, anxiety, hypertension, hyperlipidemia, presents to the emergency department for a fall/weakness/alcohol use, inability to care for himself.   OT comments  Pt seen for co-tx with PT this date to maximize services to improve pt outcomes. Pt requires CGA/MIN A +2 for bed mobility and slide-board transfer with chair with pt primarily requiring cues from OT for safety including safe hand placement and safe sequence while PT assists with lateral advancement to pt's R toward drop-arm side of recliner. OT educates re: rationale of not placing hands/digits around the slide-board and demos. Pt with good understanding. Pt is left in the chair with all needs met and in reach. Continue to anticipate that pt will require STR follow up d/t generally low fxl activity tolerance and decreased strength for basic ADL transfer safety. Of note: pt's BP generally low throughout with SBP in the 90s and DBP upper 40s to low 50s (not r/t positional changes), MAP >76 throughout and pt not symptomatic. Also noted to be very inconsistent with automatic cuff, sometimes with SBP reaching 160s-170s. RN notified and aware.   Follow Up Recommendations  SNF    Equipment Recommendations  Other (comment) (slideboard, drop arm BSC)    Recommendations for Other Services      Precautions / Restrictions Precautions Precautions: Fall Precaution Comments: uses wc for OOB activity/ slideboard Restrictions Weight Bearing Restrictions: No       Mobility Bed Mobility Overal bed mobility: Needs Assistance Bed Mobility: Supine to Sit;Sit to Supine Rolling: Min assist;+2 for safety/equipment   Supine to sit: Min assist;+2 for safety/equipment     General bed mobility comments: increased time, 2nd person  for safety    Transfers Overall transfer level: Needs assistance Equipment used: Sliding board Transfers: Lateral/Scoot Transfers          Lateral/Scoot Transfers: Min guard;Min assist;With slide board;+2 safety/equipment General transfer comment: Pt was able to use slideboards and lateral transfer to R into recliner. Increased time to perform and +2 for safety    Balance Overall balance assessment: Needs assistance Sitting-balance support: Feet supported;No upper extremity supported;Bilateral upper extremity supported Sitting balance-Leahy Scale: Good Sitting balance - Comments: pt static sat EOB with close SBA     Standing balance-Leahy Scale: Zero Standing balance comment: pt's LEs too contracted to stand, reports that while they've been contacted for years, reports worsening on R side since IM nailing in April 2022                           ADL either performed or assessed with clinical judgement   ADL                                               Vision Patient Visual Report: No change from baseline     Perception     Praxis      Cognition Arousal/Alertness: Awake/alert Behavior During Therapy: WFL for tasks assessed/performed Overall Cognitive Status: Within Functional Limits for tasks assessed  General Comments: A&O, pleasant throughout, does require some motivation/encouragement        Exercises Other Exercises Other Exercises: OT engages pt in ed re: safe hand placement with slideboard for lateral scoot.   Shoulder Instructions       General Comments      Pertinent Vitals/ Pain       Pain Assessment: Faces Faces Pain Scale: Hurts a little bit Pain Location: feet, "feet hot" when he was laying in bed, reports relief when he is up to chair Pain Descriptors / Indicators: Burning;Sore Pain Intervention(s): Monitored during session;Repositioned  Home Living                                           Prior Functioning/Environment              Frequency  Min 1X/week        Progress Toward Goals  OT Goals(current goals can now be found in the care plan section)  Progress towards OT goals: Progressing toward goals  Acute Rehab OT Goals Patient Stated Goal: to be able to stretch my legs better and use them more OT Goal Formulation: With patient Time For Goal Achievement: 09/21/20 Potential to Achieve Goals: Good  Plan Discharge plan remains appropriate    Co-evaluation    PT/OT/SLP Co-Evaluation/Treatment: Yes Reason for Co-Treatment: For patient/therapist safety;To address functional/ADL transfers PT goals addressed during session: Mobility/safety with mobility OT goals addressed during session: Proper use of Adaptive equipment and DME      AM-PAC OT "6 Clicks" Daily Activity     Outcome Measure   Help from another person eating meals?: None Help from another person taking care of personal grooming?: A Little Help from another person toileting, which includes using toliet, bedpan, or urinal?: A Little Help from another person bathing (including washing, rinsing, drying)?: A Lot Help from another person to put on and taking off regular upper body clothing?: A Little Help from another person to put on and taking off regular lower body clothing?: A Lot 6 Click Score: 17    End of Session Equipment Utilized During Treatment: Oxygen  OT Visit Diagnosis: Other abnormalities of gait and mobility (R26.89);Repeated falls (R29.6);Muscle weakness (generalized) (M62.81);Pain Pain - Right/Left: Right   Activity Tolerance Patient tolerated treatment well;Patient limited by pain   Patient Left with bed alarm set;in chair;with chair alarm set   Nurse Communication Mobility status (notified of use of slideboard)        Time: 3220-2542 OT Time Calculation (min): 39 min  Charges: OT General Charges $OT Visit: 1 Visit OT  Treatments $Self Care/Home Management : 8-22 mins  Gerrianne Scale, MS, OTR/L ascom [phone] 09/08/20, 3:21 PM

## 2020-09-08 NOTE — Progress Notes (Signed)
Pt is A&O, VS stable, on amio at 16.7/hr, IV in place. NSR, 4L [place]. Two complaints of pain, relieved with norco. IV in place, T&R self. Condom cath in place

## 2020-09-08 NOTE — Progress Notes (Signed)
PROGRESS NOTE    Aaron Harvey  ZDG:[id number] DOB: 07-01-1955 DOA: 09/04/2020 PCP: Pcp, No   Chief complaint.  Shortness of breath. Brief Narrative:  Aaron Harvey is a 66 y.o. male with medical history significant of hypertension, hyperlipidemia, COPD (using as needed oxygen at night), GERD, gout, depression, anxiety, alcohol abuse, tobacco abuse, chronic back pain, who presents with fall, alcohol intoxication in the short breath. Upon arriving here in the emergency room, he was intoxicated with alcohol level 254, elevated CK at 2244.  Checks x-ray showed mild bilateral basilar subsegmental opacity.  Procalcitonin level less than 0.1.  Her, BNP was elevated at 1799.  Patient is placed on antibiotics for possible pneumonia.   Assessment & Plan:   Principal Problem:   Acute on chronic respiratory failure with hypoxia (HCC) Active Problems:   Hypertension   Acute urinary retention   Chronic pain syndrome   Gastroesophageal reflux disease without esophagitis   Tobacco abuse   Alcohol intoxication (HCC)   HLD (hyperlipidemia)   COPD exacerbation (HCC)   Fall   Sepsis (HCC)   Elevated troponin   Rhabdomyolysis   CAP (community acquired pneumonia)   Swelling of lower leg  #1.  Acute on chronic respiratory failure with hypoxemia. Acute on chronic diastolic congestive heart failure. Aspiration pneumonitis. Sepsis, ruled out Patient condition is more consistent with acute on chronic diastolic congestive heart failure.  Patient has had echocardiogram done in 06/2020, showed ejection fraction 65 to 70%.  But he has a profound elevation of BNP.  Evidence of volume overload.   --Patient procalcitonin level 0.1, discontinued antibiotics. Plan: --cont IV lasix 40 mg BID  Possible aspiration pneumonitis.   history of dysphagia.   --SLP eval, rec regular diet with thin liquids  #2.  Alcohol abuse. Hyponatremia. On CIWA protocol and thiamine folic acid.  Advised to quit.  #3.  Mild  elevation troponin.  Rhabdomyolysis. Secondary to fall --CK peaked at 2838, started to trend down  #4.  Anemia, def in iron and B12 iron and B12 level both low --start suppl   Afib w RVR --started on amio gtt --cardio consulted, Dr. Welton Flakes --transition to oral amio today  COPD on O2 PRN   DVT prophylaxis: Lovenox Code Status: full Family Communication:  Disposition Plan: SNF   Status is: Inpatient  Remains inpatient appropriate because:IV treatments appropriate due to intensity of illness or inability to take PO and Inpatient level of care appropriate due to severity of illness  Dispo: The patient is from: Home              Anticipated d/c is to: SNF              Patient currently is not medically stable to d/c.   Difficult to place patient No   I/O last 3 completed shifts: In: 2877.3 [P.O.:2277; I.V.:564.6; IV Piggyback:35.7] Out: 2600 [Urine:2600] No intake/output data recorded.     Consultants:  None  Procedures: None  Antimicrobials: None   Subjective: Had cough, which is chronic smoker's cough.  Normal oral intake.   Objective: Vitals:   09/08/20 1120 09/08/20 1421 09/08/20 1512 09/08/20 1932  BP: 108/61  104/63 (!) 97/56  Pulse: 69  (!) 57 94  Resp: 17  16 16   Temp: 98.1 F (36.7 C)  98.1 F (36.7 C) 98.7 F (37.1 C)  TempSrc: Oral  Oral Oral  SpO2: 100% 100% 96% 95%  Weight:      Height:  Intake/Output Summary (Last 24 hours) at 09/08/2020 1948 Last data filed at 09/08/2020 1840 Gross per 24 hour  Intake 1893.41 ml  Output 1825 ml  Net 68.41 ml   Filed Weights   09/04/20 1513 09/06/20 1358  Weight: 98.9 kg 91 kg    Examination:  Constitutional: NAD, AAOx3 HEENT: conjunctivae and lids normal, EOMI CV: No cyanosis.   RESP: normal respiratory effort, on 2L Extremities: bilateral pedal edema SKIN: warm, dry Neuro: II - XII grossly intact.   Psych: Normal mood and affect.     Data Reviewed: I have personally reviewed  following labs and imaging studies  CBC: Recent Labs  Lab 09/04/20 1518 09/06/20 0459 09/07/20 0506 09/08/20 0439  WBC 6.3 11.9* 9.4 9.8  NEUTROABS  --  9.2*  --  7.5  HGB 9.7* 9.5* 9.6* 9.0*  HCT 31.3* 30.7* 32.2* 29.9*  MCV 79.6* 80.6 79.7* 80.4  PLT 286 309 362 361   Basic Metabolic Panel: Recent Labs  Lab 09/04/20 1518 09/06/20 0459 09/07/20 0506 09/08/20 0439  NA 129* 132* 130* 128*  K 4.1 3.7 4.1 3.8  CL 96* 91* 91* 89*  CO2 25 27 31  34*  GLUCOSE 94 101* 160* 156*  BUN 7* 16 15 18   CREATININE 0.68 0.99 0.73 0.77  CALCIUM 8.3* 8.3* 8.4* 8.3*  MG  --   --   --  1.7   GFR: Estimated Creatinine Clearance: 100.8 mL/min (by C-G formula based on SCr of 0.77 mg/dL). Liver Function Tests: Recent Labs  Lab 09/04/20 1518  AST 20  ALT 10  ALKPHOS 54  BILITOT 0.5  PROT 6.9  ALBUMIN 3.4*   No results for input(s): LIPASE, AMYLASE in the last 168 hours. No results for input(s): AMMONIA in the last 168 hours. Coagulation Profile: Recent Labs  Lab 09/06/20 0842  INR 1.1   Cardiac Enzymes: Recent Labs  Lab 09/04/20 1518 09/06/20 0459 09/07/20 0506 09/08/20 0439  CKTOTAL 629* 2,244* 2,838* 1,196*   BNP (last 3 results) No results for input(s): PROBNP in the last 8760 hours. HbA1C: Recent Labs    09/06/20 0842  HGBA1C 5.1   CBG: Recent Labs  Lab 09/06/20 1733 09/06/20 2121 09/07/20 0802  GLUCAP 145* 148* 161*   Lipid Profile: Recent Labs    09/07/20 0506  CHOL 150  HDL 41  LDLCALC 98  TRIG 53  CHOLHDL 3.7   Thyroid Function Tests: No results for input(s): TSH, T4TOTAL, FREET4, T3FREE, THYROIDAB in the last 72 hours. Anemia Panel: Recent Labs    09/07/20 0506 09/07/20 1234  VITAMINB12  --  162*  TIBC 413  --   IRON 19*  --    Sepsis Labs: Recent Labs  Lab 09/06/20 0459 09/06/20 0842 09/06/20 1437  PROCALCITON <0.10  --   --   LATICACIDVEN 1.3 0.9 1.3    Recent Results (from the past 240 hour(s))  Resp Panel by RT-PCR (Flu  A&B, Covid) Nasopharyngeal Swab     Status: None   Collection Time: 09/04/20  3:20 PM   Specimen: Nasopharyngeal Swab; Nasopharyngeal(NP) swabs in vial transport medium  Result Value Ref Range Status   SARS Coronavirus 2 by RT PCR NEGATIVE NEGATIVE Final    Comment: (NOTE) SARS-CoV-2 target nucleic acids are NOT DETECTED.  The SARS-CoV-2 RNA is generally detectable in upper respiratory specimens during the acute phase of infection. The lowest concentration of SARS-CoV-2 viral copies this assay can detect is 138 copies/mL. A negative result does not preclude SARS-Cov-2 infection and  should not be used as the sole basis for treatment or other patient management decisions. A negative result may occur with  improper specimen collection/handling, submission of specimen other than nasopharyngeal swab, presence of viral mutation(s) within the areas targeted by this assay, and inadequate number of viral copies(<138 copies/mL). A negative result must be combined with clinical observations, patient history, and epidemiological information. The expected result is Negative.  Fact Sheet for Patients:  BloggerCourse.comhttps://www.fda.gov/media/152166/download  Fact Sheet for Healthcare Providers:  SeriousBroker.ithttps://www.fda.gov/media/152162/download  This test is no t yet approved or cleared by the [place] FDA and  has been authorized for detection and/or diagnosis of SARS-CoV-2 by FDA under an Emergency Use Authorization (EUA). This EUA will remain  in effect (meaning this test can be used) for the duration of the COVID-19 declaration under Section 564(b)(1) of the Act, 21 U.S.C.section 360bbb-3(b)(1), unless the authorization is terminated  or revoked sooner.       Influenza A by PCR NEGATIVE NEGATIVE Final   Influenza B by PCR NEGATIVE NEGATIVE Final    Comment: (NOTE) The Xpert Xpress SARS-CoV-2/FLU/RSV plus assay is intended as an aid in the diagnosis of influenza from Nasopharyngeal swab specimens  and should not be used as a sole basis for treatment. Nasal washings and aspirates are unacceptable for Xpert Xpress SARS-CoV-2/FLU/RSV testing.  Fact Sheet for Patients: BloggerCourse.comhttps://www.fda.gov/media/152166/download  Fact Sheet for Healthcare Providers: SeriousBroker.ithttps://www.fda.gov/media/152162/download  This test is not yet approved or cleared by the [place] FDA and has been authorized for detection and/or diagnosis of SARS-CoV-2 by FDA under an Emergency Use Authorization (EUA). This EUA will remain in effect (meaning this test can be used) for the duration of the COVID-19 declaration under Section 564(b)(1) of the Act, 21 U.S.C. section 360bbb-3(b)(1), unless the authorization is terminated or revoked.  Performed at [facility], [address] [place], [place] [address]   Blood culture (routine x 2)     Status: None (Preliminary result)   Collection Time: 09/06/20  7:54 AM   Specimen: BLOOD  Result Value Ref Range Status   Specimen Description BLOOD RIGHT HAND  Final   Special Requests   Final    BOTTLES DRAWN AEROBIC AND ANAEROBIC Blood Culture results may not be optimal due to an inadequate volume of blood received in culture bottles   Culture   Final    NO GROWTH 2 DAYS Performed at [facility], [address] [place], [place] [address]    Report Status PENDING  Incomplete  Blood culture (routine x 2)     Status: None (Preliminary result)   Collection Time: 09/06/20  7:55 AM   Specimen: BLOOD  Result Value Ref Range Status   Specimen Description BLOOD RIGHT ANTECUBITAL  Final   Special Requests   Final    BOTTLES DRAWN AEROBIC AND ANAEROBIC Blood Culture results may not be optimal due to an inadequate volume of blood received in culture bottles   Culture   Final    NO GROWTH 2 DAYS Performed at [facility], [address] [place], [place] [address]    Report Status PENDING  Incomplete         Radiology Studies: No results  found.      Scheduled Meds:  amiodarone  400 mg Oral Daily   aspirin EC  81 mg Oral Daily   Chlorhexidine Gluconate Cloth  6 each Topical Daily   diltiazem  240 mg Oral Daily   enoxaparin (LOVENOX) injection  0.5 mg/kg Subcutaneous Q24H  furosemide  40 mg Intravenous BID   gabapentin  600 mg Oral TID   ipratropium-albuterol  3 mL Nebulization TID   loratadine  10 mg Oral Daily   LORazepam  0-4 mg Intravenous Q12H   Or   LORazepam  0-4 mg Oral Q12H   nicotine  21 mg Transdermal Daily   pantoprazole  40 mg Oral Daily   PARoxetine  10 mg Oral Daily   tamsulosin  0.4 mg Oral Daily   thiamine  100 mg Oral Daily   Or   thiamine  100 mg Intravenous Daily   Continuous Infusions:     LOS: 2 days    Darlin Priestly, MD [facility]   To contact the attending provider between 7A-7P or the covering provider during after hours 7P-7A, please log into the web site [email or web address] and access using universal [place] password for that web site. If you do not have the password, please call the hospital operator.  09/08/2020, 7:48 PM

## 2020-09-08 NOTE — Progress Notes (Signed)
SUBJECTIVE: Aaron Harvey is a 65 y.o. male with medical history significant of hypertension, hyperlipidemia, COPD (using as needed oxygen at night), GERD, gout, depression, anxiety, alcohol abuse, tobacco abuse, chronic back pain.  Patient denies chest pain and shortness of breath. Patient has converted from atrial fibrillation.   Vitals:   09/07/20 1503 09/07/20 1745 09/07/20 2008 09/08/20 0741  BP: 128/78 98/73 112/62 (!) 103/50  Pulse: (!) 125 (!) 128 71 63  Resp: 17 17 16 17   Temp: 98.8 F (37.1 C) 98.4 F (36.9 C) 97.8 F (36.6 C) 98.4 F (36.9 C)  TempSrc:      SpO2: 93% 96% 91% 100%  Weight:      Height:        Intake/Output Summary (Last 24 hours) at 09/08/2020 0825 Last data filed at 09/08/2020 0640 Gross per 24 hour  Intake 980.87 ml  Output 1125 ml  Net -144.13 ml    LABS: Basic Metabolic Panel: Recent Labs    09/07/20 0506 09/08/20 0439  NA 130* 128*  K 4.1 3.8  CL 91* 89*  CO2 31 34*  GLUCOSE 160* 156*  BUN 15 18  CREATININE 0.73 0.77  CALCIUM 8.4* 8.3*  MG  --  1.7   Liver Function Tests: No results for input(s): AST, ALT, ALKPHOS, BILITOT, PROT, ALBUMIN in the last 72 hours. No results for input(s): LIPASE, AMYLASE in the last 72 hours. CBC: Recent Labs    09/06/20 0459 09/07/20 0506 09/08/20 0439  WBC 11.9* 9.4 9.8  NEUTROABS 9.2*  --  7.5  HGB 9.5* 9.6* 9.0*  HCT 30.7* 32.2* 29.9*  MCV 80.6 79.7* 80.4  PLT 309 362 361   Cardiac Enzymes: Recent Labs    09/06/20 0459 09/07/20 0506 09/08/20 0439  CKTOTAL 2,244* 2,838* 1,196*   BNP: Invalid input(s): POCBNP D-Dimer: No results for input(s): DDIMER in the last 72 hours. Hemoglobin A1C: Recent Labs    09/06/20 0842  HGBA1C 5.1   Fasting Lipid Panel: Recent Labs    09/07/20 0506  CHOL 150  HDL 41  LDLCALC 98  TRIG 53  CHOLHDL 3.7   Thyroid Function Tests: No results for input(s): TSH, T4TOTAL, T3FREE, THYROIDAB in the last 72 hours.  Invalid input(s): FREET3 Anemia  Panel: Recent Labs    09/07/20 0506 09/07/20 1234  VITAMINB12  --  162*  TIBC 413  --   IRON 19*  --      PHYSICAL EXAM General: Well developed, well nourished, in no acute distress HEENT:  Normocephalic and atramatic Neck:  No JVD.  Lungs: Clear bilaterally to auscultation and percussion. Heart: HRRR . Normal S1 and S2 without gallops or murmurs.  Abdomen: Bowel sounds are positive, abdomen soft and non-tender  Msk:  Back normal, normal gait. Normal strength and tone for age. Extremities: No clubbing, cyanosis or edema.   Neuro: Alert and oriented X 3. Psych:  Good affect, responds appropriately  TELEMETRY: NSR, HR 66  ASSESSMENT AND PLAN: Patient on amiodarone, agree with plan.  Principal Problem:   Acute on chronic respiratory failure with hypoxia (HCC) Active Problems:   Hypertension   Acute urinary retention   Chronic pain syndrome   Gastroesophageal reflux disease without esophagitis   Tobacco abuse   Alcohol intoxication (HCC)   HLD (hyperlipidemia)   COPD exacerbation (HCC)   Fall   Sepsis (HCC)   Elevated troponin   Rhabdomyolysis   CAP (community acquired pneumonia)   Swelling of lower leg    Amber Scoggins,  FNP-C 09/08/2020 8:25 AM

## 2020-09-08 NOTE — Progress Notes (Addendum)
Physical Therapy Treatment Patient Details Name: Aaron Harvey MRN: [id number] DOB: [birth year] Today's Date: 09/08/2020    History of Present Illness Patient is a 65 y.o. male with a past medical history of alcohol abuse, COPD, anxiety, hypertension, hyperlipidemia, presents to the emergency department for a fall/weakness/alcohol use, inability to care for himself.    PT Comments    PT/OT co-treat 2/2 to pt safety for OOB activity. Session focused on pt getting out of bed to recliner. BP inconsistent throughout session. Lowest BP reading during session 96/48 however pt endorses no symptoms. He was able to use slide board to exit L side of get and lateral scoot towards recliner on R. Vcs throughout transfer for improved technique and sequencing. Fatigue is reported but with rest breaks was able to finish transfer. RN staff to contact author when pt is ready to return to bed. Highly recommend DC to SNF to improve pt's independence with ADLs. RN aware of pt's abilities.    Follow Up Recommendations  SNF     Equipment Recommendations  Other (comment) (slideboard)       Precautions / Restrictions Precautions Precautions: Fall Precaution Comments: uses wc for OOB activity/ slideboard Restrictions Weight Bearing Restrictions: No    Mobility  Bed Mobility Overal bed mobility: Needs Assistance Bed Mobility: Supine to Sit;Sit to Supine Rolling: Min assist;+2 for safety/equipment   Supine to sit: Min assist;+2 for safety/equipment     General bed mobility comments: increased time to perform with min assist of one with 2nd person for safety    Transfers Overall transfer level: Needs assistance Equipment used: Sliding board Transfers: Lateral/Scoot Transfers          Lateral/Scoot Transfers: With slide board General transfer comment: Pt was able to use slideboards and lateral transfer to R into recliner. Increased time to perform and +2 for safety  Ambulation/Gait    General Gait  Details: unable/unsafe at this time. BLEs contractures.   Balance Overall balance assessment: Needs assistance Sitting-balance support: Feet supported;No upper extremity supported;Bilateral upper extremity supported Sitting balance-Leahy Scale: Good Sitting balance - Comments: pt static sat EOB with close SBA      Cognition Arousal/Alertness: Awake/alert Behavior During Therapy: WFL for tasks assessed/performed Overall Cognitive Status: Within Functional Limits for tasks assessed      General Comments: Pt is A and O x 4. Needs alot of encouragement but once miotivated cooperated well.             Pertinent Vitals/Pain Pain Assessment: No/denies pain     PT Goals (current goals can now be found in the care plan section) Progress towards PT goals: Progressing toward goals    Frequency    Min 2X/week      PT Plan Current plan remains appropriate    Co-evaluation PT/OT/SLP Co-Evaluation/Treatment: Yes Reason for Co-Treatment: For patient/therapist safety          AM-PAC PT "6 Clicks" Mobility   Outcome Measure  Help needed turning from your back to your side while in a flat bed without using bedrails?: A Little Help needed moving from lying on your back to sitting on the side of a flat bed without using bedrails?: A Lot Help needed moving to and from a bed to a chair (including a wheelchair)?: A Lot Help needed standing up from a chair using your arms (e.g., wheelchair or bedside chair)?: Total Help needed to walk in hospital room?: Total Help needed climbing 3-5 steps with a railing? : Total 6 Click  Score: 10    End of Session Equipment Utilized During Treatment: Oxygen Activity Tolerance: Patient tolerated treatment well Patient left: in chair;with call bell/phone within reach;with chair alarm set Nurse Communication: Mobility status PT Visit Diagnosis: Unsteadiness on feet (R26.81);Muscle weakness (generalized) (M62.81)     Time: 7543-6067 PT Time  Calculation (min) (ACUTE ONLY): 39 min  Charges:  $Therapeutic Activity: 23-37 mins                     Jetta Lout PTA 09/08/20, 1:53 PM

## 2020-09-09 ENCOUNTER — Inpatient Hospital Stay: Payer: Medicare Other

## 2020-09-09 DIAGNOSIS — I4891 Unspecified atrial fibrillation: Secondary | ICD-10-CM

## 2020-09-09 LAB — CBC
HCT: 28.5 % — ABNORMAL LOW (ref 39.0–52.0)
Hemoglobin: 8.6 g/dL — ABNORMAL LOW (ref 13.0–17.0)
MCH: 24.8 pg — ABNORMAL LOW (ref 26.0–34.0)
MCHC: 30.2 g/dL (ref 30.0–36.0)
MCV: 82.1 fL (ref 80.0–100.0)
Platelets: 352 10*3/uL (ref 150–400)
RBC: 3.47 MIL/uL — ABNORMAL LOW (ref 4.22–5.81)
RDW: 16.2 % — ABNORMAL HIGH (ref 11.5–15.5)
WBC: 9.2 10*3/uL (ref 4.0–10.5)
nRBC: 0 % (ref 0.0–0.2)

## 2020-09-09 LAB — BASIC METABOLIC PANEL
Anion gap: 7 (ref 5–15)
BUN: 20 mg/dL (ref 8–23)
CO2: 34 mmol/L — ABNORMAL HIGH (ref 22–32)
Calcium: 8.5 mg/dL — ABNORMAL LOW (ref 8.9–10.3)
Chloride: 88 mmol/L — ABNORMAL LOW (ref 98–111)
Creatinine, Ser: 1.1 mg/dL (ref 0.61–1.24)
GFR, Estimated: >60 mL/min (ref >60)
Glucose, Bld: 121 mg/dL — ABNORMAL HIGH (ref 70–99)
Potassium: 3.8 mmol/L (ref 3.5–5.1)
Sodium: 129 mmol/L — ABNORMAL LOW (ref 135–145)

## 2020-09-09 LAB — MAGNESIUM: Magnesium: 2 mg/dL (ref 1.7–2.4)

## 2020-09-09 MED ORDER — POLYSACCHARIDE IRON COMPLEX 150 MG PO CAPS
150.0000 mg | ORAL_CAPSULE | Freq: Every day | ORAL | Status: DC
  Administered 2020-09-09 – 2020-09-10 (×2): 150 mg via ORAL
  Filled 2020-09-09 (×2): qty 1

## 2020-09-09 MED ORDER — DILTIAZEM HCL ER COATED BEADS 180 MG PO CP24
180.0000 mg | ORAL_CAPSULE | Freq: Every day | ORAL | Status: DC
  Administered 2020-09-10: 180 mg via ORAL
  Filled 2020-09-09: qty 1

## 2020-09-09 MED ORDER — OXYCODONE-ACETAMINOPHEN 5-325 MG PO TABS
1.0000 | ORAL_TABLET | Freq: Four times a day (QID) | ORAL | Status: DC | PRN
  Administered 2020-09-09 – 2020-09-10 (×2): 1 via ORAL
  Filled 2020-09-09 (×2): qty 1

## 2020-09-09 MED ORDER — VITAMIN B-12 1000 MCG PO TABS
1000.0000 ug | ORAL_TABLET | Freq: Every day | ORAL | Status: DC
  Administered 2020-09-09 – 2020-09-10 (×2): 1000 ug via ORAL
  Filled 2020-09-09 (×2): qty 1

## 2020-09-09 NOTE — Care Management Important Message (Signed)
Important Message  Patient Details  Name: Aaron Harvey MRN: [id number] Date of Birth: Sep 20, 1955   Medicare Important Message Given:  Yes     Johnell Comings 09/09/2020, 2:54 PM

## 2020-09-09 NOTE — Progress Notes (Signed)
Physical Therapy Treatment Patient Details Name: Aaron Harvey MRN: [id number] DOB: 04-Apr-1955 Today's Date: 09/09/2020    History of Present Illness Patient is a 65 y.o. male with a past medical history of alcohol abuse, COPD, anxiety, hypertension, hyperlipidemia, presents to the emergency department for a fall/weakness/alcohol use, inability to care for himself.    PT Comments    Patient agreeable to PT. Patient continues to require assistance for bed mobility. Assistance required for scooting transfers in sitting position along edge of bed. Limited activity tolerance and generalized weakness with limited BLE range of motion despite facilitation. Patient is not at his baseline level of functional mobility and continues to require assistance with bed mobility and transfers. SNF is recommended for ongoing PT efforts to maximize independence and decrease caregiver burden before returning home.    Follow Up Recommendations  SNF     Equipment Recommendations  Other (comment) (to be determined at next level of care)    Recommendations for Other Services       Precautions / Restrictions Precautions Precautions: Fall Restrictions Weight Bearing Restrictions: No    Mobility  Bed Mobility Overal bed mobility: Needs Assistance Bed Mobility: Supine to Sit;Sit to Supine     Supine to sit: Mod assist;HOB elevated Sit to supine: Mod assist;HOB elevated   General bed mobility comments: assistance for BLE support and support for upright trunk. verbal cues for sequencing and technique    Transfers Overall transfer level: Needs assistance   Transfers: Lateral/Scoot Transfers          Lateral/Scoot Transfers: Max assist General transfer comment: Max A for incremental scooting transfer along edge of bed. verbal cues for technique. activity tolerance is limited by fatigue and limited AROM of BLE.  Ambulation/Gait                 Stairs             Wheelchair  Mobility    Modified Rankin (Stroke Patients Only)       Balance                                            Cognition Arousal/Alertness: Awake/alert Behavior During Therapy: WFL for tasks assessed/performed Overall Cognitive Status: Within Functional Limits for tasks assessed                                        Exercises General Exercises - Lower Extremity Ankle Circles/Pumps: AROM;Strengthening;Both;10 reps;Seated Other Exercises Other Exercises: with tone inhibiting techniques and faciliation from therapist, patient able to achieve increased left knee extension by approximately 10 degrees.    General Comments        Pertinent Vitals/Pain Pain Assessment: Faces Faces Pain Scale: Hurts a little bit Pain Location: chronic back and leg/foot pain bilaterally Pain Descriptors / Indicators: Discomfort Pain Intervention(s): Limited activity within patient's tolerance (less pain reported with sitting up on edge of bed)    Home Living                      Prior Function            PT Goals (current goals can now be found in the care plan section) Acute Rehab PT Goals Patient Stated Goal: to be more comfortable PT  Goal Formulation: With patient Time For Goal Achievement: 09/19/20 Potential to Achieve Goals: Fair Progress towards PT goals: Progressing toward goals    Frequency    Min 2X/week      PT Plan Current plan remains appropriate    Co-evaluation              AM-PAC PT "6 Clicks" Mobility   Outcome Measure  Help needed turning from your back to your side while in a flat bed without using bedrails?: A Little Help needed moving from lying on your back to sitting on the side of a flat bed without using bedrails?: A Lot Help needed moving to and from a bed to a chair (including a wheelchair)?: A Lot Help needed standing up from a chair using your arms (e.g., wheelchair or bedside chair)?: Total Help  needed to walk in hospital room?: Total Help needed climbing 3-5 steps with a railing? : Total 6 Click Score: 10    End of Session Equipment Utilized During Treatment: Oxygen Activity Tolerance: Patient tolerated treatment well Patient left: in bed;with call bell/phone within reach;with bed alarm set Nurse Communication: Mobility status PT Visit Diagnosis: Unsteadiness on feet (R26.81);Muscle weakness (generalized) (M62.81)     Time: 0354-6568 PT Time Calculation (min) (ACUTE ONLY): 16 min  Charges:  $Therapeutic Activity: 8-22 mins                     Donna Bernard, PT, MPT    Ina Homes 09/09/2020, 12:34 PM

## 2020-09-09 NOTE — Progress Notes (Signed)
   09/09/20 0711  Assess: MEWS Score  Temp 98 F (36.7 C)  BP 108/66  Pulse Rate (!) 122  Resp 17  SpO2 93 %  O2 Device Nasal Cannula  O2 Flow Rate (L/min) 2 L/min  Assess: MEWS Score  MEWS Temp 0  MEWS Systolic 0  MEWS Pulse 2  MEWS RR 0  MEWS LOC 0  MEWS Score 2  MEWS Score Color Yellow  Assess: if the MEWS score is Yellow or Red  Were vital signs taken at a resting state? Yes  Focused Assessment Change from prior assessment (see assessment flowsheet)  Does the patient meet 2 or more of the SIRS criteria? No  MEWS guidelines implemented *See Row Information* Yes  Treat  MEWS Interventions Administered scheduled meds/treatments  Pain Scale 0-10  Pain Score 10  Pain Type Chronic pain;Acute pain  Pain Location Knee  Pain Orientation Right;Left  Pain Descriptors / Indicators Aching  Pain Frequency Constant  Pain Onset Awakened from sleep  Patients Stated Pain Goal 0  Pain Intervention(s) Medication (See eMAR)  Take Vital Signs  Increase Vital Sign Frequency  Yellow: Q 2hr X 2 then Q 4hr X 2, if remains yellow, continue Q 4hrs  Escalate  MEWS: Escalate Yellow: discuss with charge nurse/RN and consider discussing with provider and RRT  Notify: Charge Nurse/RN  Name of Charge Nurse/RN Notified Shade Flood, RN  Date Charge Nurse/RN Notified 09/09/20  Time Charge Nurse/RN Notified 0263  Notify: Provider  Provider Name/Title Dr. Fran Lowes  Date Provider Notified 09/09/20  Time Provider Notified 0740  Notification Type  (secure chat)  Notification Reason Change in status  Provider response No new orders  Date of Provider Response 09/09/20  Time of Provider Response 0741  Document  Patient Outcome Stabilized after interventions  Assess: SIRS CRITERIA  SIRS Temperature  0  SIRS Pulse 1  SIRS Respirations  0  SIRS WBC 0  SIRS Score Sum  1

## 2020-09-09 NOTE — Progress Notes (Signed)
SUBJECTIVE: Aaron Harvey is a 65 y.o. male with medical history significant of hypertension, hyperlipidemia, COPD (using as needed oxygen at night), GERD, gout, depression, anxiety, alcohol abuse, tobacco abuse, chronic back pain.   Patient denies chest pain and shortness of breath.    Vitals:   09/08/20 2200 09/09/20 0530 09/09/20 0711 09/09/20 0745  BP:  121/69 108/66   Pulse:  76 (!) 122   Resp:  16 17   Temp:  98.4 F (36.9 C) 98 F (36.7 C)   TempSrc:  Oral Oral   SpO2: 96% 96% 93% 93%  Weight:      Height:        Intake/Output Summary (Last 24 hours) at 09/09/2020 0830 Last data filed at 09/09/2020 0536 Gross per 24 hour  Intake 2016.41 ml  Output 2275 ml  Net -258.59 ml    LABS: Basic Metabolic Panel: Recent Labs    09/08/20 0439 09/09/20 0503  NA 128* 129*  K 3.8 3.8  CL 89* 88*  CO2 34* 34*  GLUCOSE 156* 121*  BUN 18 20  CREATININE 0.77 1.10  CALCIUM 8.3* 8.5*  MG 1.7 2.0   Liver Function Tests: No results for input(s): AST, ALT, ALKPHOS, BILITOT, PROT, ALBUMIN in the last 72 hours. No results for input(s): LIPASE, AMYLASE in the last 72 hours. CBC: Recent Labs    09/08/20 0439 09/09/20 0503  WBC 9.8 9.2  NEUTROABS 7.5  --   HGB 9.0* 8.6*  HCT 29.9* 28.5*  MCV 80.4 82.1  PLT 361 352   Cardiac Enzymes: Recent Labs    09/07/20 0506 09/08/20 0439  CKTOTAL 2,838* 1,196*   BNP: Invalid input(s): POCBNP D-Dimer: No results for input(s): DDIMER in the last 72 hours. Hemoglobin A1C: Recent Labs    09/06/20 0842  HGBA1C 5.1   Fasting Lipid Panel: Recent Labs    09/07/20 0506  CHOL 150  HDL 41  LDLCALC 98  TRIG 53  CHOLHDL 3.7   Thyroid Function Tests: No results for input(s): TSH, T4TOTAL, T3FREE, THYROIDAB in the last 72 hours.  Invalid input(s): FREET3 Anemia Panel: Recent Labs    09/07/20 0506 09/07/20 1234  VITAMINB12  --  162*  TIBC 413  --   IRON 19*  --      PHYSICAL EXAM General: Well developed, well nourished,  in no acute distress HEENT:  Normocephalic and atramatic Neck:  No JVD.  Lungs: Clear bilaterally to auscultation and percussion. Heart: HRRR . Normal S1 and S2 without gallops or murmurs.  Abdomen: Bowel sounds are positive, abdomen soft and non-tender  Msk:  Back normal, normal gait. Normal strength and tone for age. Extremities: No clubbing, cyanosis or edema.   Neuro: Alert and oriented X 3. Psych:  Good affect, responds appropriately  TELEMETRY: atrial fibrillation, HR 122  ASSESSMENT AND PLAN: Patient in atrial fibrillation, EKG ordered. Continue amiodarone.  Principal Problem:   Acute on chronic respiratory failure with hypoxia (HCC) Active Problems:   Hypertension   Acute urinary retention   Chronic pain syndrome   Gastroesophageal reflux disease without esophagitis   Tobacco abuse   Alcohol intoxication (HCC)   HLD (hyperlipidemia)   COPD exacerbation (HCC)   Fall   Sepsis (HCC)   Elevated troponin   Rhabdomyolysis   CAP (community acquired pneumonia)   Swelling of lower leg    Amber Scoggins, FNP-C 09/09/2020 8:30 AM

## 2020-09-09 NOTE — TOC Progression Note (Signed)
Transition of Care [facility]) - Progression Note    Patient Details  Name: STEVAN EBERWEIN MRN: [id number] Date of Birth: Jul 23, 1955  Transition of Care [facility]) CM/SW Contact  Maree Krabbe, LCSW Phone Number: 09/09/2020, 2:24 PM  Clinical Narrative:   Berkley Harvey obtained  [id number] Reola Calkins ID [id number]  Info provided to deborah with [facility].    Expected Discharge Plan: Skilled Nursing Facility Barriers to Discharge: Continued Medical Work up  Expected Discharge Plan and Services Expected Discharge Plan: Skilled Nursing Facility In-house Referral: Clinical Social Work     Living arrangements for the past 2 months: Single Family Home                                       Social Determinants of Health (SDOH) Interventions    Readmission Risk Interventions No flowsheet data found.

## 2020-09-09 NOTE — Progress Notes (Signed)
PROGRESS NOTE    Aaron Harvey  NGE:[id number] DOB: 1955/08/27 DOA: 09/04/2020 PCP: Pcp, No   Chief complaint.  Shortness of breath. Brief Narrative:  Aaron Harvey is a 65 y.o. male with medical history significant of hypertension, hyperlipidemia, COPD (using as needed oxygen at night), GERD, gout, depression, anxiety, alcohol abuse, tobacco abuse, chronic back pain, who presents with fall, alcohol intoxication in the short breath. Upon arriving here in the emergency room, he was intoxicated with alcohol level 254, elevated CK at 2244.  Checks x-ray showed mild bilateral basilar subsegmental opacity.  Procalcitonin level less than 0.1.  Her, BNP was elevated at 1799.  Patient is placed on antibiotics for possible pneumonia.   Assessment & Plan:   Principal Problem:   Acute on chronic respiratory failure with hypoxia (HCC) Active Problems:   Hypertension   Acute urinary retention   Chronic pain syndrome   Gastroesophageal reflux disease without esophagitis   Tobacco abuse   Alcohol intoxication (HCC)   HLD (hyperlipidemia)   COPD exacerbation (HCC)   Fall   Sepsis (HCC)   Elevated troponin   Rhabdomyolysis   CAP (community acquired pneumonia)   Swelling of lower leg  #1.  Acute on chronic respiratory failure with hypoxemia. Acute on chronic diastolic congestive heart failure. Aspiration pneumonitis. Sepsis, ruled out Patient condition is more consistent with acute on chronic diastolic congestive heart failure.  Patient has had echocardiogram done in 06/2020, showed ejection fraction 65 to 70%.  But he has a profound elevation of BNP.  Evidence of volume overload.   --Patient procalcitonin level 0.1, discontinued antibiotics. Plan: --cont IV lasix 40 mg x1 today  Possible aspiration pneumonitis.   history of dysphagia.   --SLP eval, rec regular diet with thin liquids  #2.  Alcohol abuse. Hyponatremia. On CIWA protocol and thiamine folic acid.  Advised to quit.  #3.  Mild  elevation troponin.  Rhabdomyolysis. Secondary to fall --CK peaked at 2838, started to trend down  #4.  Anemia, def in iron and B12 iron and B12 level both low --start oral iron and vit B12 suppl  Afib w RVR --intermittently still going into RVR --started on amio gtt, transitioned to oral --started on Cardizem 240 mg daily by hospitalist --cardio consulted, Dr. Welton Flakes Plan: --cont oral amiodarone --decrease cardizem to 180 mg daily, due to low BP  COPD on O2 PRN  Chronic pain syndrome:  Bilateral knee pain --xray of both knees -Continue home Neurontin, Robaxin --Percocet ordered as q4h PRN, taper down to q6h PRN  Hx of urinary retention --Flomax on hold due to hypotension   DVT prophylaxis: Lovenox Code Status: full Family Communication:  Disposition Plan: SNF   Status is: Inpatient  Remains inpatient appropriate because:IV treatments appropriate due to intensity of illness or inability to take PO and Inpatient level of care appropriate due to severity of illness  Dispo: The patient is from: Home              Anticipated d/c is to: SNF              Patient currently is not medically stable to d/c.  Not rate controlled.    Difficult to place patient No   I/O last 3 completed shifts: In: 2253.4 [P.O.:1677; I.V.:540.8; IV Piggyback:35.7] Out: 2625 [Urine:2625] Total I/O In: 480 [P.O.:480] Out: 1250 [Urine:1250]     Consultants:  None  Procedures: None  Antimicrobials: None   Subjective: Pt complained of severe bilateral knee pain today, and  burning foot pain.   Objective: Vitals:   09/09/20 0745 09/09/20 0911 09/09/20 1128 09/09/20 1614  BP:  (!) 85/50 (!) 85/53 96/65  Pulse:  62 63 70  Resp:  18 17 16   Temp:   98.2 F (36.8 C) 99.4 F (37.4 C)  TempSrc:      SpO2: 93% 96% 90% 96%  Weight:      Height:        Intake/Output Summary (Last 24 hours) at 09/09/2020 1653 Last data filed at 09/09/2020 1340 Gross per 24 hour  Intake 1320 ml   Output 2375 ml  Net -1055 ml   Filed Weights   09/04/20 1513 09/06/20 1358  Weight: 98.9 kg 91 kg    Examination:  Constitutional: NAD, AAOx3 HEENT: conjunctivae and lids normal, EOMI CV: No cyanosis.   RESP: normal respiratory effort, on 2L Extremities: No effusions, edema in BLE.  Both knees appeared normal, without erythema, edema or warmth SKIN: warm, dry Neuro: II - XII grossly intact.   Psych: depressed mood and affect.      Data Reviewed: I have personally reviewed following labs and imaging studies  CBC: Recent Labs  Lab 09/04/20 1518 09/06/20 0459 09/07/20 0506 09/08/20 0439 09/09/20 0503  WBC 6.3 11.9* 9.4 9.8 9.2  NEUTROABS  --  9.2*  --  7.5  --   HGB 9.7* 9.5* 9.6* 9.0* 8.6*  HCT 31.3* 30.7* 32.2* 29.9* 28.5*  MCV 79.6* 80.6 79.7* 80.4 82.1  PLT 286 309 362 361 352   Basic Metabolic Panel: Recent Labs  Lab 09/04/20 1518 09/06/20 0459 09/07/20 0506 09/08/20 0439 09/09/20 0503  NA 129* 132* 130* 128* 129*  K 4.1 3.7 4.1 3.8 3.8  CL 96* 91* 91* 89* 88*  CO2 25 27 31  34* 34*  GLUCOSE 94 101* 160* 156* 121*  BUN 7* 16 15 18 20   CREATININE 0.68 0.99 0.73 0.77 1.10  CALCIUM 8.3* 8.3* 8.4* 8.3* 8.5*  MG  --   --   --  1.7 2.0   GFR: Estimated Creatinine Clearance: 73.3 mL/min (by C-G formula based on SCr of 1.1 mg/dL). Liver Function Tests: Recent Labs  Lab 09/04/20 1518  AST 20  ALT 10  ALKPHOS 54  BILITOT 0.5  PROT 6.9  ALBUMIN 3.4*   No results for input(s): LIPASE, AMYLASE in the last 168 hours. No results for input(s): AMMONIA in the last 168 hours. Coagulation Profile: Recent Labs  Lab 09/06/20 0842  INR 1.1   Cardiac Enzymes: Recent Labs  Lab 09/04/20 1518 09/06/20 0459 09/07/20 0506 09/08/20 0439  CKTOTAL 629* 2,244* 2,838* 1,196*   BNP (last 3 results) No results for input(s): PROBNP in the last 8760 hours. HbA1C: No results for input(s): HGBA1C in the last 72 hours.  CBG: Recent Labs  Lab 09/06/20 1733  09/06/20 2121 09/07/20 0802  GLUCAP 145* 148* 161*   Lipid Profile: Recent Labs    09/07/20 0506  CHOL 150  HDL 41  LDLCALC 98  TRIG 53  CHOLHDL 3.7   Thyroid Function Tests: No results for input(s): TSH, T4TOTAL, FREET4, T3FREE, THYROIDAB in the last 72 hours. Anemia Panel: Recent Labs    09/07/20 0506 09/07/20 1234  VITAMINB12  --  162*  TIBC 413  --   IRON 19*  --    Sepsis Labs: Recent Labs  Lab 09/06/20 0459 09/06/20 0842 09/06/20 1437  PROCALCITON <0.10  --   --   LATICACIDVEN 1.3 0.9 1.3    Recent Results (  from the past 240 hour(s))  Resp Panel by RT-PCR (Flu A&B, Covid) Nasopharyngeal Swab     Status: None   Collection Time: 09/04/20  3:20 PM   Specimen: Nasopharyngeal Swab; Nasopharyngeal(NP) swabs in vial transport medium  Result Value Ref Range Status   SARS Coronavirus 2 by RT PCR NEGATIVE NEGATIVE Final    Comment: (NOTE) SARS-CoV-2 target nucleic acids are NOT DETECTED.  The SARS-CoV-2 RNA is generally detectable in upper respiratory specimens during the acute phase of infection. The lowest concentration of SARS-CoV-2 viral copies this assay can detect is 138 copies/mL. A negative result does not preclude SARS-Cov-2 infection and should not be used as the sole basis for treatment or other patient management decisions. A negative result may occur with  improper specimen collection/handling, submission of specimen other than nasopharyngeal swab, presence of viral mutation(s) within the areas targeted by this assay, and inadequate number of viral copies(<138 copies/mL). A negative result must be combined with clinical observations, patient history, and epidemiological information. The expected result is Negative.  Fact Sheet for Patients:  BloggerCourse.comhttps://www.fda.gov/media/152166/download  Fact Sheet for Healthcare Providers:  SeriousBroker.ithttps://www.fda.gov/media/152162/download  This test is no t yet approved or cleared by the [place] FDA and  has been  authorized for detection and/or diagnosis of SARS-CoV-2 by FDA under an Emergency Use Authorization (EUA). This EUA will remain  in effect (meaning this test can be used) for the duration of the COVID-19 declaration under Section 564(b)(1) of the Act, 21 U.S.C.section 360bbb-3(b)(1), unless the authorization is terminated  or revoked sooner.       Influenza A by PCR NEGATIVE NEGATIVE Final   Influenza B by PCR NEGATIVE NEGATIVE Final    Comment: (NOTE) The Xpert Xpress SARS-CoV-2/FLU/RSV plus assay is intended as an aid in the diagnosis of influenza from Nasopharyngeal swab specimens and should not be used as a sole basis for treatment. Nasal washings and aspirates are unacceptable for Xpert Xpress SARS-CoV-2/FLU/RSV testing.  Fact Sheet for Patients: BloggerCourse.comhttps://www.fda.gov/media/152166/download  Fact Sheet for Healthcare Providers: SeriousBroker.ithttps://www.fda.gov/media/152162/download  This test is not yet approved or cleared by the [place] FDA and has been authorized for detection and/or diagnosis of SARS-CoV-2 by FDA under an Emergency Use Authorization (EUA). This EUA will remain in effect (meaning this test can be used) for the duration of the COVID-19 declaration under Section 564(b)(1) of the Act, 21 U.S.C. section 360bbb-3(b)(1), unless the authorization is terminated or revoked.  Performed at [facility], [address] [place], [place] [address]   Blood culture (routine x 2)     Status: None (Preliminary result)   Collection Time: 09/06/20  7:54 AM   Specimen: BLOOD  Result Value Ref Range Status   Specimen Description BLOOD RIGHT HAND  Final   Special Requests   Final    BOTTLES DRAWN AEROBIC AND ANAEROBIC Blood Culture results may not be optimal due to an inadequate volume of blood received in culture bottles   Culture   Final    NO GROWTH 3 DAYS Performed at [facility], [address] [place], [place] [address]    Report Status PENDING   Incomplete  Blood culture (routine x 2)     Status: None (Preliminary result)   Collection Time: 09/06/20  7:55 AM   Specimen: BLOOD  Result Value Ref Range Status   Specimen Description BLOOD RIGHT ANTECUBITAL  Final   Special Requests   Final    BOTTLES DRAWN AEROBIC AND ANAEROBIC Blood Culture results may not be  optimal due to an inadequate volume of blood received in culture bottles   Culture   Final    NO GROWTH 3 DAYS Performed at [facility], [address], [place], [place] [address]    Report Status PENDING  Incomplete         Radiology Studies: No results found.      Scheduled Meds:  amiodarone  400 mg Oral Daily   aspirin EC  81 mg Oral Daily   Chlorhexidine Gluconate Cloth  6 each Topical Daily   diltiazem  240 mg Oral Daily   enoxaparin (LOVENOX) injection  0.5 mg/kg Subcutaneous Q24H   gabapentin  600 mg Oral TID   ipratropium-albuterol  3 mL Nebulization TID   loratadine  10 mg Oral Daily   LORazepam  0-4 mg Intravenous Q12H   Or   LORazepam  0-4 mg Oral Q12H   nicotine  21 mg Transdermal Daily   pantoprazole  40 mg Oral Daily   PARoxetine  10 mg Oral Daily   tamsulosin  0.4 mg Oral Daily   thiamine  100 mg Oral Daily   Or   thiamine  100 mg Intravenous Daily   Continuous Infusions:     LOS: 3 days    Darlin Priestly, MD [facility]   To contact the attending provider between 7A-7P or the covering provider during after hours 7P-7A, please log into the web site [email or web address] and access using universal [place] password for that web site. If you do not have the password, please call the hospital operator.  09/09/2020, 4:53 PM

## 2020-09-10 DIAGNOSIS — J449 Chronic obstructive pulmonary disease, unspecified: Secondary | ICD-10-CM | POA: Diagnosis not present

## 2020-09-10 DIAGNOSIS — J9621 Acute and chronic respiratory failure with hypoxia: Secondary | ICD-10-CM | POA: Diagnosis not present

## 2020-09-10 DIAGNOSIS — Z8781 Personal history of (healed) traumatic fracture: Secondary | ICD-10-CM | POA: Diagnosis not present

## 2020-09-10 DIAGNOSIS — I10 Essential (primary) hypertension: Secondary | ICD-10-CM | POA: Diagnosis not present

## 2020-09-10 DIAGNOSIS — I509 Heart failure, unspecified: Secondary | ICD-10-CM | POA: Diagnosis not present

## 2020-09-10 DIAGNOSIS — J9611 Chronic respiratory failure with hypoxia: Secondary | ICD-10-CM | POA: Diagnosis not present

## 2020-09-10 DIAGNOSIS — S82251K Displaced comminuted fracture of shaft of right tibia, subsequent encounter for closed fracture with nonunion: Secondary | ICD-10-CM | POA: Diagnosis not present

## 2020-09-10 DIAGNOSIS — Z9889 Other specified postprocedural states: Secondary | ICD-10-CM | POA: Diagnosis not present

## 2020-09-10 DIAGNOSIS — J690 Pneumonitis due to inhalation of food and vomit: Secondary | ICD-10-CM | POA: Diagnosis not present

## 2020-09-10 DIAGNOSIS — Z743 Need for continuous supervision: Secondary | ICD-10-CM | POA: Diagnosis not present

## 2020-09-10 DIAGNOSIS — Z9981 Dependence on supplemental oxygen: Secondary | ICD-10-CM | POA: Diagnosis not present

## 2020-09-10 DIAGNOSIS — Z7401 Bed confinement status: Secondary | ICD-10-CM | POA: Diagnosis not present

## 2020-09-10 DIAGNOSIS — Z4789 Encounter for other orthopedic aftercare: Secondary | ICD-10-CM | POA: Diagnosis not present

## 2020-09-10 DIAGNOSIS — R5381 Other malaise: Secondary | ICD-10-CM | POA: Diagnosis not present

## 2020-09-10 DIAGNOSIS — G894 Chronic pain syndrome: Secondary | ICD-10-CM | POA: Diagnosis not present

## 2020-09-10 DIAGNOSIS — R2681 Unsteadiness on feet: Secondary | ICD-10-CM | POA: Diagnosis not present

## 2020-09-10 DIAGNOSIS — R270 Ataxia, unspecified: Secondary | ICD-10-CM | POA: Diagnosis not present

## 2020-09-10 DIAGNOSIS — I5020 Unspecified systolic (congestive) heart failure: Secondary | ICD-10-CM | POA: Diagnosis not present

## 2020-09-10 DIAGNOSIS — I4891 Unspecified atrial fibrillation: Secondary | ICD-10-CM | POA: Diagnosis not present

## 2020-09-10 DIAGNOSIS — Z79899 Other long term (current) drug therapy: Secondary | ICD-10-CM | POA: Diagnosis not present

## 2020-09-10 DIAGNOSIS — M6281 Muscle weakness (generalized): Secondary | ICD-10-CM | POA: Diagnosis not present

## 2020-09-10 DIAGNOSIS — M6282 Rhabdomyolysis: Secondary | ICD-10-CM | POA: Diagnosis not present

## 2020-09-10 DIAGNOSIS — M2550 Pain in unspecified joint: Secondary | ICD-10-CM | POA: Diagnosis not present

## 2020-09-10 DIAGNOSIS — K219 Gastro-esophageal reflux disease without esophagitis: Secondary | ICD-10-CM | POA: Diagnosis not present

## 2020-09-10 DIAGNOSIS — I5033 Acute on chronic diastolic (congestive) heart failure: Secondary | ICD-10-CM | POA: Diagnosis not present

## 2020-09-10 DIAGNOSIS — D649 Anemia, unspecified: Secondary | ICD-10-CM | POA: Diagnosis not present

## 2020-09-10 LAB — CBC
HCT: 27.2 % — ABNORMAL LOW (ref 39.0–52.0)
Hemoglobin: 8.4 g/dL — ABNORMAL LOW (ref 13.0–17.0)
MCH: 24.7 pg — ABNORMAL LOW (ref 26.0–34.0)
MCHC: 30.9 g/dL (ref 30.0–36.0)
MCV: 80 fL (ref 80.0–100.0)
Platelets: 364 10*3/uL (ref 150–400)
RBC: 3.4 MIL/uL — ABNORMAL LOW (ref 4.22–5.81)
RDW: 16 % — ABNORMAL HIGH (ref 11.5–15.5)
WBC: 13 10*3/uL — ABNORMAL HIGH (ref 4.0–10.5)
nRBC: 0 % (ref 0.0–0.2)

## 2020-09-10 LAB — BASIC METABOLIC PANEL
Anion gap: 5 (ref 5–15)
BUN: 21 mg/dL (ref 8–23)
CO2: 32 mmol/L (ref 22–32)
Calcium: 8.5 mg/dL — ABNORMAL LOW (ref 8.9–10.3)
Chloride: 91 mmol/L — ABNORMAL LOW (ref 98–111)
Creatinine, Ser: 0.96 mg/dL (ref 0.61–1.24)
GFR, Estimated: >60 mL/min (ref >60)
Glucose, Bld: 133 mg/dL — ABNORMAL HIGH (ref 70–99)
Potassium: 3.9 mmol/L (ref 3.5–5.1)
Sodium: 128 mmol/L — ABNORMAL LOW (ref 135–145)

## 2020-09-10 LAB — RESP PANEL BY RT-PCR (FLU A&B, COVID) ARPGX2
Influenza A by PCR: NEGATIVE
Influenza B by PCR: NEGATIVE
SARS Coronavirus 2 by RT PCR: NEGATIVE

## 2020-09-10 LAB — MAGNESIUM: Magnesium: 2.2 mg/dL (ref 1.7–2.4)

## 2020-09-10 MED ORDER — NICOTINE 21 MG/24HR TD PT24: 21.0000 mg | MEDICATED_PATCH | Freq: Every day | TRANSDERMAL | 0 refills | Status: AC

## 2020-09-10 MED ORDER — POLYSACCHARIDE IRON COMPLEX 150 MG PO CAPS: 150.0000 mg | ORAL_CAPSULE | Freq: Every day | ORAL | Status: AC

## 2020-09-10 MED ORDER — THIAMINE HCL 100 MG PO TABS: 100.0000 mg | ORAL_TABLET | Freq: Every day | ORAL | Status: AC

## 2020-09-10 MED ORDER — OXYCODONE-ACETAMINOPHEN 5-325 MG PO TABS: 1.0000 | ORAL_TABLET | Freq: Four times a day (QID) | ORAL | 0 refills | Status: AC | PRN

## 2020-09-10 MED ORDER — CYANOCOBALAMIN 1000 MCG PO TABS: 1000.0000 ug | ORAL_TABLET | Freq: Every day | ORAL | Status: AC

## 2020-09-10 MED ORDER — ACETAMINOPHEN 325 MG PO TABS: 650.0000 mg | ORAL_TABLET | Freq: Four times a day (QID) | ORAL | Status: AC | PRN

## 2020-09-10 MED ORDER — AMIODARONE HCL 400 MG PO TABS: 400.0000 mg | ORAL_TABLET | Freq: Every day | ORAL | Status: AC

## 2020-09-10 NOTE — Progress Notes (Signed)
Physical Therapy Treatment Patient Details Name: Aaron Harvey MRN: [id number] DOB: [birth year] Today's Date: 09/10/2020    History of Present Illness Patient is a 65 y.o. male with a past medical history of alcohol abuse, COPD, anxiety, hypertension, hyperlipidemia, presents to the emergency department for a fall/weakness/alcohol use, inability to care for himself.    PT Comments    Pt received breathing treatment prior to PT entry. Treatment limited secondary fatigue, pain, and O2 desaturation. Pt continues to require mod-max assist for supine<>sit transfers. Able to perform multiplanar reaching outside BOS without LOB, demonstrating "good" seated balance with U UE support. PROM hip flexion performed but discontinued secondary to increased reports of pain. Pt with desaturation after sit>supine transfer, with SpO2 dropping into 70's. Able to recover to 85% with cues for PLB, HOB elevation, and increased titration to 4L. RN called to room and pt able to recover >90% with +2 supine scoot towards HOB and titration to 6L. Pt returned to 2L with SpO2 >90% at end of session. Pt will continue to benefit from skilled acute PT services to address deficits for return to baseline function. Will continue to recommend SNF at DC.     Follow Up Recommendations  SNF     Equipment Recommendations  Other (comment) (to be determined at next level of care)    Recommendations for Other Services       Precautions / Restrictions Precautions Precautions: Fall Precaution Comments: uses wc for OOB activity/ slideboard Restrictions Weight Bearing Restrictions: No    Mobility  Bed Mobility Overal bed mobility: Needs Assistance Bed Mobility: Supine to Sit;Sit to Supine     Supine to sit: Mod assist Sit to supine: Max assist   General bed mobility comments: Mod for trunk facilitation to sit EOB with HOB elevated; max assist for BLE/trunk support to lie supine       Balance Overall balance assessment:  Needs assistance   Sitting balance-Leahy Scale: Good Sitting balance - Comments: pt static sat EOB with close SBA           Cognition Arousal/Alertness: Awake/alert Behavior During Therapy: WFL for tasks assessed/performed Overall Cognitive Status: Within Functional Limits for tasks assessed         Exercises Other Exercises Other Exercises: Pt able to participate in bed mobility and seated multiplanar reaching outside BOS at EOB. x10 PROM marching performed, but discontinued due to reports of difficulty breathing and increased pain levels.    General Comments General comments (skin integrity, edema, etc.): Desaturation on 2L after sit>supine transfer down to 70's; pt able to recover to 85% with cues for PLB and increased titration to 4L. Unable to recover >85% despite elevating HOB and increased titration to 6L. Pt able to recover after +2 supine scoot towards HOB and HOB elevated. RN present. Pt at 96% on 2L prior to PT exit.      Pertinent Vitals/Pain Pain Score: 8  Pain Location: chronic back and leg/foot pain bilaterally Pain Intervention(s): Limited activity within patient's tolerance     PT Goals (current goals can now be found in the care plan section) Acute Rehab PT Goals Patient Stated Goal: to be more comfortable PT Goal Formulation: With patient Time For Goal Achievement: 09/19/20 Potential to Achieve Goals: Fair Progress towards PT goals: Progressing toward goals    Frequency    Min 2X/week      PT Plan Current plan remains appropriate       AM-PAC PT "6 Clicks" Mobility   Outcome Measure  Help needed turning from your back to your side while in a flat bed without using bedrails?: A Little Help needed moving from lying on your back to sitting on the side of a flat bed without using bedrails?: A Lot Help needed moving to and from a bed to a chair (including a wheelchair)?: A Lot Help needed standing up from a chair using your arms (e.g., wheelchair  or bedside chair)?: Total Help needed to walk in hospital room?: Total Help needed climbing 3-5 steps with a railing? : Total 6 Click Score: 10    End of Session Equipment Utilized During Treatment: Oxygen Activity Tolerance: Patient tolerated treatment well Patient left: in bed;with call bell/phone within reach;with bed alarm set Nurse Communication: Mobility status PT Visit Diagnosis: Unsteadiness on feet (R26.81);Muscle weakness (generalized) (M62.81)     Time: 1410-1430 PT Time Calculation (min) (ACUTE ONLY): 20 min  Charges:  $Therapeutic Exercise: 8-22 mins                     Vira Blanco, PT, DPT 2:42 PM,09/10/20

## 2020-09-10 NOTE — Discharge Summary (Addendum)
Physician Discharge Summary   Aaron Harvey  male DOB: 09-09-1955  QQP:[id number]  PCP: Pcp, No  Admit date: 09/04/2020 Discharge date: 09/10/2020  Admitted From: home Disposition:  SNF CODE STATUS: Full code   Hospital Course:  For full details, please see H&P, progress notes, consult notes and ancillary notes.  Briefly,  Aaron Harvey is a 65 y.o. male with medical history significant of hypertension, hyperlipidemia, COPD (using as needed oxygen at night), GERD, gout, depression, anxiety, alcohol abuse, tobacco abuse, chronic back pain, who presented with fall, alcohol intoxication.   # Acute on chronic respiratory failure with hypoxemia. # Acute on chronic diastolic congestive heart failure. # Aspiration pneumonitis. # Sepsis, ruled out Checks x-ray showed mild bilateral basilar subsegmental opacity.  Procalcitonin level less than 0.1.  BNP was elevated at 1799.  Patient condition was more consistent with acute on chronic diastolic congestive heart failure.  Patient has had echocardiogram done in 06/2020, showed ejection fraction 65 to 70%.   --Patient procalcitonin level 0.1, discontinued antibiotics. --Pt was diuresed with IV Lasix 40 mg BID during hospitalization and discharged on home lasix 40 mg daily.   COPD on O2 PRN at home --pt has been using 2L O2 during hospitalization. --cont home Trelegy Ellipta.  Possible aspiration pneumonitis.   history of dysphagia.   --SLP eval, rec regular diet with thin liquids   Alcohol abuse. Hyponatremia. Upon arriving here in the emergency room, he was intoxicated with alcohol level 254.  On CIWA protocol without withdrawal issues.   --cont thiamine    Mild elevation troponin 2/2 demand ischemia  Rhabdomyolysis. Secondary to fall --CK peaked at 2838, started to trend down   Anemia, def in iron and B12 --Hgb in 8's.  Iron and B12 level both low --started on oral iron and vit B12 suppl   Paroxysmal AF w RVR --started on  amiodarone gtt, transitioned to oral 400 mg daily. --started on Cardizem 240 mg daily by hospitalist which was switched back to home metop after discharge. --cardio consulted, and will follow up with Dr. Welton Flakes 09/13/20 Monday 9 am   Chronic pain syndrome  Bilateral knee pain Recent surgery for Right Tibial Fracture on 06/08/20 --xray of left knee wnl, however, right knee showed "Status post ORIF of a tibial shaft fracture, Proximal fibular shaft fracture. Fracture lucencies remain visible, without significant interval healing."   --Discussed with ortho Dr. Rosita Kea who will see pt in 2 weeks as outpatient. -Continue home Neurontin, Robaxin --Percocet q6h PRN prescribed for 5 more days at discharge.   Hx of urinary retention --Flomax on hold due to hypotension, resumed at discharge.     Discharge Diagnoses:  Principal Problem:   Acute on chronic respiratory failure with hypoxia (HCC) Active Problems:   Hypertension   Acute urinary retention   Chronic pain syndrome   Gastroesophageal reflux disease without esophagitis   Tobacco abuse   Alcohol intoxication (HCC)   HLD (hyperlipidemia)   COPD exacerbation (HCC)   Fall   Sepsis (HCC)   Elevated troponin   Rhabdomyolysis   CAP (community acquired pneumonia)   Swelling of lower leg   30 Day Unplanned Readmission Risk Score    Flowsheet Row ED to Hosp-Admission (Current) from 09/04/2020 in [facility]  30 Day Unplanned Readmission Risk Score (%) 16.14 Filed at 09/10/2020 1200       This score is the patient's risk of an unplanned readmission within 30 days of being discharged (0 -100%). The  score is based on dignosis, age, lab data, medications, orders, and past utilization.   Low:  0-14.9   Medium: 15-21.9   High: 22-29.9   Extreme: 30 and above          Discharge Instructions:  Allergies as of 09/10/2020   No Known Allergies      Medication List     STOP taking these medications    potassium  chloride SA 20 MEQ tablet Commonly known as: KLOR-CON       TAKE these medications    acetaminophen 325 MG tablet Commonly known as: TYLENOL Take 2 tablets (650 mg total) by mouth every 6 (six) hours as needed for mild pain or moderate pain.   albuterol 108 (90 Base) MCG/ACT inhaler Commonly known as: VENTOLIN HFA Inhale 2 puffs into the lungs every 4 (four) hours as needed. Reported on 09/15/2015   amiodarone 400 MG tablet Commonly known as: PACERONE Take 1 tablet (400 mg total) by mouth daily. Start taking on: September 11, 2020   aspirin EC 81 MG tablet Take 1 tablet (81 mg total) by mouth daily.   cyanocobalamin 1000 MCG tablet Take 1 tablet (1,000 mcg total) by mouth daily. Start taking on: September 11, 2020   furosemide 40 MG tablet Commonly known as: LASIX Take 1 tablet (40 mg total) by mouth daily.   gabapentin 300 MG capsule Commonly known as: NEURONTIN Take 2 capsules (600 mg total) by mouth 3 (three) times daily.   iron polysaccharides 150 MG capsule Commonly known as: NIFEREX Take 1 capsule (150 mg total) by mouth daily. Can take any form. Start taking on: September 11, 2020   loratadine 10 MG tablet Commonly known as: CLARITIN Take 1 tablet (10 mg total) by mouth daily.   methocarbamol 500 MG tablet Commonly known as: ROBAXIN Take 1 tablet (500 mg total) by mouth every 6 (six) hours as needed for muscle spasms.   metoprolol tartrate 25 MG tablet Commonly known as: LOPRESSOR Take 1 tablet (25 mg total) by mouth 2 (two) times daily.   nicotine 21 mg/24hr patch Commonly known as: NICODERM CQ - dosed in mg/24 hours Place 1 patch (21 mg total) onto the skin daily. Start taking on: September 11, 2020   omeprazole 40 MG capsule Commonly known as: PRILOSEC Take 1 capsule (40 mg total) by mouth daily before breakfast.   oxyCODONE-acetaminophen 5-325 MG tablet Commonly known as: PERCOCET/ROXICET Take 1 tablet by mouth every 6 (six) hours as needed for up to 5 days for  moderate pain.   PARoxetine 10 MG tablet Commonly known as: PAXIL Take 1 tablet (10 mg total) by mouth daily.   pravastatin 40 MG tablet Commonly known as: PRAVACHOL Take 1 tablet (40 mg total) by mouth at bedtime.   tamsulosin 0.4 MG Caps capsule Commonly known as: FLOMAX Take 1 capsule (0.4 mg total) by mouth daily after supper.   thiamine 100 MG tablet Take 1 tablet (100 mg total) by mouth daily. Start taking on: September 11, 2020   Trelegy Ellipta 100-62.5-25 MCG/INH Aepb Generic drug: Fluticasone-Umeclidin-Vilant Inhale 1 puff into the lungs daily.         Contact information for follow-up providers     Kennedy BuckerMenz, Michael, MD Follow up in 1 week(s).   Specialty: Orthopedic Surgery Why: for non-healing Right Tibial Fracture. Contact information: [address] [facility] [place] [id number] [phone]         Laurier NancyKhan, Shaukat A, MD Follow up on 09/13/2020.  Specialty: Cardiology Why: 9 am. Contact information: [address] [place] [address] [phone]              Contact information for after-discharge care     Destination     [facility] .   Service: Skilled Nursing Contact information: [address] [place] [address] [phone]                     No Known Allergies   The results of significant diagnostics from this hospitalization (including imaging, microbiology, ancillary and laboratory) are listed below for reference.   Consultations:   Procedures/Studies: DG Knee 1-2 Views Left  Result Date: 09/09/2020 CLINICAL DATA:  Bilateral knee pain EXAM: LEFT KNEE - 1-2 VIEW COMPARISON:  None. FINDINGS: No fracture or dislocation is seen. The joint spaces are preserved. Visualized soft tissues are within normal limits. No suprapatellar knee joint effusion. IMPRESSION: Negative. Electronically Signed   By: Charline Bills M.D.   On: 09/09/2020 19:05    DG Knee 1-2 Views Right  Result Date: 09/09/2020 CLINICAL DATA:  Bilateral knee pain EXAM: RIGHT KNEE - 1-2 VIEW COMPARISON:  Right tib-fib radiographs dated 06/08/2020 FINDINGS: IM nail fixation of a mid tibial shaft fracture, incompletely visualized. Proximal fibular shaft fracture, incompletely visualized. Both fractures remain visible, without significant interval healing. Knee joints are preserved. Visualized soft tissues are within normal limits. No suprapatellar knee joint effusion. IMPRESSION: Status post ORIF of a tibial shaft fracture, incompletely visualized. Proximal fibular shaft fracture, incompletely visualized. Fracture lucencies remain visible, without significant interval healing. Electronically Signed   By: Charline Bills M.D.   On: 09/09/2020 19:04   CT Head Wo Contrast  Result Date: 09/04/2020 CLINICAL DATA:  Head injury. EXAM: CT HEAD WITHOUT CONTRAST TECHNIQUE: Contiguous axial images were obtained from the base of the skull through the vertex without intravenous contrast. COMPARISON:  June 05, 2020. FINDINGS: Brain: No evidence of acute infarction, hemorrhage, hydrocephalus, extra-axial collection or mass lesion/mass effect. Vascular: No hyperdense vessel or unexpected calcification. Skull: Normal. Negative for fracture or focal lesion. Sinuses/Orbits: No acute finding. Other: None. IMPRESSION: No acute intracranial abnormality seen. Electronically Signed   By: Lupita Raider M.D.   On: 09/04/2020 16:14   US Venous Img Lower Bilateral (DVT)  Result Date: 09/06/2020 CLINICAL DATA:  Bilateral lower extremity edema EXAM: BILATERAL LOWER EXTREMITY VENOUS DOPPLER ULTRASOUND TECHNIQUE: Gray-scale sonography with graded compression, as well as color Doppler and duplex ultrasound were performed to evaluate the lower extremity deep venous systems from the level of the common femoral vein and including the common femoral, femoral, profunda femoral, popliteal and calf veins including the  posterior tibial, peroneal and gastrocnemius veins when visible. The superficial great saphenous vein was also interrogated. Spectral Doppler was utilized to evaluate flow at rest and with distal augmentation maneuvers in the common femoral, femoral and popliteal veins. COMPARISON:  None. FINDINGS: RIGHT LOWER EXTREMITY Common Femoral Vein: No evidence of thrombus. Normal compressibility, respiratory phasicity and response to augmentation. Saphenofemoral Junction: No evidence of thrombus. Normal compressibility and flow on color Doppler imaging. Profunda Femoral Vein: No evidence of thrombus. Normal compressibility and flow on color Doppler imaging. Femoral Vein: No evidence of thrombus. Normal compressibility, respiratory phasicity and response to augmentation. Popliteal Vein: No evidence of thrombus. Normal compressibility, respiratory phasicity and response to augmentation. Calf Veins: No evidence of thrombus. Normal compressibility and flow on color Doppler imaging. Superficial Great Saphenous Vein: No evidence of  thrombus. Normal compressibility. Venous Reflux:  None. Other Findings:  None. LEFT LOWER EXTREMITY Common Femoral Vein: No evidence of thrombus. Normal compressibility, respiratory phasicity and response to augmentation. Saphenofemoral Junction: No evidence of thrombus. Normal compressibility and flow on color Doppler imaging. Profunda Femoral Vein: No evidence of thrombus. Normal compressibility and flow on color Doppler imaging. Femoral Vein: No evidence of thrombus. Normal compressibility, respiratory phasicity and response to augmentation. Popliteal Vein: No evidence of thrombus. Normal compressibility, respiratory phasicity and response to augmentation. Calf Veins: No evidence of thrombus. Normal compressibility and flow on color Doppler imaging. Superficial Great Saphenous Vein: No evidence of thrombus. Normal compressibility. Venous Reflux:  None. Other Findings:  None. IMPRESSION: No evidence  of deep venous thrombosis in either lower extremity. Electronically Signed   By: Malachy Moan M.D.   On: 09/06/2020 21:09   DG Chest Portable 1 View  Result Date: 09/04/2020 CLINICAL DATA:  Weakness. EXAM: PORTABLE CHEST 1 VIEW COMPARISON:  June 05, 2020. FINDINGS: Stable cardiomediastinal silhouette. No pneumothorax or pleural effusion is noted. Mild bibasilar subsegmental atelectasis or infiltrates are noted. Bony thorax is unremarkable. IMPRESSION: Mild bibasilar subsegmental atelectasis or infiltrates are noted. Electronically Signed   By: Lupita Raider M.D.   On: 09/04/2020 15:39      Labs: BNP (last 3 results) Recent Labs    06/06/20 0859 09/06/20 0842  BNP 525.0* 1,799.0*   Basic Metabolic Panel: Recent Labs  Lab 09/06/20 0459 09/07/20 0506 09/08/20 0439 09/09/20 0503 09/10/20 0449  NA 132* 130* 128* 129* 128*  K 3.7 4.1 3.8 3.8 3.9  CL 91* 91* 89* 88* 91*  CO2 27 31 34* 34* 32  GLUCOSE 101* 160* 156* 121* 133*  BUN 16 15 18 20 21   CREATININE 0.99 0.73 0.77 1.10 0.96  CALCIUM 8.3* 8.4* 8.3* 8.5* 8.5*  MG  --   --  1.7 2.0 2.2   Liver Function Tests: Recent Labs  Lab 09/04/20 1518  AST 20  ALT 10  ALKPHOS 54  BILITOT 0.5  PROT 6.9  ALBUMIN 3.4*   No results for input(s): LIPASE, AMYLASE in the last 168 hours. No results for input(s): AMMONIA in the last 168 hours. CBC: Recent Labs  Lab 09/06/20 0459 09/07/20 0506 09/08/20 0439 09/09/20 0503 09/10/20 0449  WBC 11.9* 9.4 9.8 9.2 13.0*  NEUTROABS 9.2*  --  7.5  --   --   HGB 9.5* 9.6* 9.0* 8.6* 8.4*  HCT 30.7* 32.2* 29.9* 28.5* 27.2*  MCV 80.6 79.7* 80.4 82.1 80.0  PLT 309 362 361 352 364   Cardiac Enzymes: Recent Labs  Lab 09/04/20 1518 09/06/20 0459 09/07/20 0506 09/08/20 0439  CKTOTAL 629* 2,244* 2,838* 1,196*   BNP: Invalid input(s): POCBNP CBG: Recent Labs  Lab 09/06/20 1733 09/06/20 2121 09/07/20 0802  GLUCAP 145* 148* 161*   D-Dimer No results for input(s): DDIMER in  the last 72 hours. Hgb A1c No results for input(s): HGBA1C in the last 72 hours. Lipid Profile No results for input(s): CHOL, HDL, LDLCALC, TRIG, CHOLHDL, LDLDIRECT in the last 72 hours. Thyroid function studies No results for input(s): TSH, T4TOTAL, T3FREE, THYROIDAB in the last 72 hours.  Invalid input(s): FREET3 Anemia work up No results for input(s): VITAMINB12, FOLATE, FERRITIN, TIBC, IRON, RETICCTPCT in the last 72 hours. Urinalysis    Component Value Date/Time   COLORURINE YELLOW (A) 09/04/2020 1518   APPEARANCEUR CLEAR (A) 09/04/2020 1518   LABSPEC 1.004 (L) 09/04/2020 1518   PHURINE 6.0 09/04/2020 1518  GLUCOSEU NEGATIVE 09/04/2020 1518   HGBUR NEGATIVE 09/04/2020 1518   BILIRUBINUR NEGATIVE 09/04/2020 1518   KETONESUR NEGATIVE 09/04/2020 1518   PROTEINUR NEGATIVE 09/04/2020 1518   NITRITE NEGATIVE 09/04/2020 1518   LEUKOCYTESUR NEGATIVE 09/04/2020 1518   Sepsis Labs Invalid input(s): PROCALCITONIN,  WBC,  LACTICIDVEN Microbiology Recent Results (from the past 240 hour(s))  Resp Panel by RT-PCR (Flu A&B, Covid) Nasopharyngeal Swab     Status: None   Collection Time: 09/04/20  3:20 PM   Specimen: Nasopharyngeal Swab; Nasopharyngeal(NP) swabs in vial transport medium  Result Value Ref Range Status   SARS Coronavirus 2 by RT PCR NEGATIVE NEGATIVE Final    Comment: (NOTE) SARS-CoV-2 target nucleic acids are NOT DETECTED.  The SARS-CoV-2 RNA is generally detectable in upper respiratory specimens during the acute phase of infection. The lowest concentration of SARS-CoV-2 viral copies this assay can detect is 138 copies/mL. A negative result does not preclude SARS-Cov-2 infection and should not be used as the sole basis for treatment or other patient management decisions. A negative result may occur with  improper specimen collection/handling, submission of specimen other than nasopharyngeal swab, presence of viral mutation(s) within the areas targeted by this assay,  and inadequate number of viral copies(<138 copies/mL). A negative result must be combined with clinical observations, patient history, and epidemiological information. The expected result is Negative.  Fact Sheet for Patients:  BloggerCourse.com  Fact Sheet for Healthcare Providers:  SeriousBroker.it  This test is no t yet approved or cleared by the [place] FDA and  has been authorized for detection and/or diagnosis of SARS-CoV-2 by FDA under an Emergency Use Authorization (EUA). This EUA will remain  in effect (meaning this test can be used) for the duration of the COVID-19 declaration under Section 564(b)(1) of the Act, 21 U.S.C.section 360bbb-3(b)(1), unless the authorization is terminated  or revoked sooner.       Influenza A by PCR NEGATIVE NEGATIVE Final   Influenza B by PCR NEGATIVE NEGATIVE Final    Comment: (NOTE) The Xpert Xpress SARS-CoV-2/FLU/RSV plus assay is intended as an aid in the diagnosis of influenza from Nasopharyngeal swab specimens and should not be used as a sole basis for treatment. Nasal washings and aspirates are unacceptable for Xpert Xpress SARS-CoV-2/FLU/RSV testing.  Fact Sheet for Patients: BloggerCourse.com  Fact Sheet for Healthcare Providers: SeriousBroker.it  This test is not yet approved or cleared by the [place] FDA and has been authorized for detection and/or diagnosis of SARS-CoV-2 by FDA under an Emergency Use Authorization (EUA). This EUA will remain in effect (meaning this test can be used) for the duration of the COVID-19 declaration under Section 564(b)(1) of the Act, 21 U.S.C. section 360bbb-3(b)(1), unless the authorization is terminated or revoked.  Performed at [facility], [address] [place], [place] [address]   Blood culture (routine x 2)     Status: None (Preliminary result)   Collection Time:  09/06/20  7:54 AM   Specimen: BLOOD  Result Value Ref Range Status   Specimen Description BLOOD RIGHT HAND  Final   Special Requests   Final    BOTTLES DRAWN AEROBIC AND ANAEROBIC Blood Culture results may not be optimal due to an inadequate volume of blood received in culture bottles   Culture   Final    NO GROWTH 4 DAYS Performed at [facility], [address], [place], [place] [address]    Report Status PENDING  Incomplete  Blood culture (routine x 2)  Status: None (Preliminary result)   Collection Time: 09/06/20  7:55 AM   Specimen: BLOOD  Result Value Ref Range Status   Specimen Description BLOOD RIGHT ANTECUBITAL  Final   Special Requests   Final    BOTTLES DRAWN AEROBIC AND ANAEROBIC Blood Culture results may not be optimal due to an inadequate volume of blood received in culture bottles   Culture   Final    NO GROWTH 4 DAYS Performed at [facility], [address] [place], [place] [address]    Report Status PENDING  Incomplete  Resp Panel by RT-PCR (Flu A&B, Covid) Nasopharyngeal Swab     Status: None   Collection Time: 09/10/20 12:20 PM   Specimen: Nasopharyngeal Swab; Nasopharyngeal(NP) swabs in vial transport medium  Result Value Ref Range Status   SARS Coronavirus 2 by RT PCR NEGATIVE NEGATIVE Final    Comment: (NOTE) SARS-CoV-2 target nucleic acids are NOT DETECTED.  The SARS-CoV-2 RNA is generally detectable in upper respiratory specimens during the acute phase of infection. The lowest concentration of SARS-CoV-2 viral copies this assay can detect is 138 copies/mL. A negative result does not preclude SARS-Cov-2 infection and should not be used as the sole basis for treatment or other patient management decisions. A negative result may occur with  improper specimen collection/handling, submission of specimen other than nasopharyngeal swab, presence of viral mutation(s) within the areas targeted by this assay, and inadequate number of  viral copies(<138 copies/mL). A negative result must be combined with clinical observations, patient history, and epidemiological information. The expected result is Negative.  Fact Sheet for Patients:  BloggerCourse.com  Fact Sheet for Healthcare Providers:  SeriousBroker.it  This test is no t yet approved or cleared by the [place] FDA and  has been authorized for detection and/or diagnosis of SARS-CoV-2 by FDA under an Emergency Use Authorization (EUA). This EUA will remain  in effect (meaning this test can be used) for the duration of the COVID-19 declaration under Section 564(b)(1) of the Act, 21 U.S.C.section 360bbb-3(b)(1), unless the authorization is terminated  or revoked sooner.       Influenza A by PCR NEGATIVE NEGATIVE Final   Influenza B by PCR NEGATIVE NEGATIVE Final    Comment: (NOTE) The Xpert Xpress SARS-CoV-2/FLU/RSV plus assay is intended as an aid in the diagnosis of influenza from Nasopharyngeal swab specimens and should not be used as a sole basis for treatment. Nasal washings and aspirates are unacceptable for Xpert Xpress SARS-CoV-2/FLU/RSV testing.  Fact Sheet for Patients: BloggerCourse.com  Fact Sheet for Healthcare Providers: SeriousBroker.it  This test is not yet approved or cleared by the [place] FDA and has been authorized for detection and/or diagnosis of SARS-CoV-2 by FDA under an Emergency Use Authorization (EUA). This EUA will remain in effect (meaning this test can be used) for the duration of the COVID-19 declaration under Section 564(b)(1) of the Act, 21 U.S.C. section 360bbb-3(b)(1), unless the authorization is terminated or revoked.  Performed at [facility], [address] [place], [place] [address]      Total time spend on discharging this patient, including the last patient exam, discussing the hospital  stay, instructions for ongoing care as it relates to all pertinent caregivers, as well as preparing the medical discharge records, prescriptions, and/or referrals as applicable, is 45 minutes.    Darlin Priestly, MD  [facility] 09/10/2020, 2:43 PM

## 2020-09-10 NOTE — TOC Progression Note (Addendum)
Transition of Care [facility]) - Progression Note    Patient Details  Name: Aaron Harvey MRN: [id number] Date of Birth: March 09, 1955  Transition of Care [facility]) CM/SW Contact  Margarito Liner, LCSW Phone Number: 09/10/2020, 10:34 AM  Clinical Narrative:  Left voicemail for [facility] SNF admissions coordinator to see if they would have a bed today. Per MD, cardiology has cleared for discharge. She will order rapid COVID test.   11:36 am: No call back yet. Left voicemail for SNF admissions coordinator on office phone.   12:07 pm: Admissions coordinator is checking on bed availability for today. Per patient, he has had 2 COVID vaccines but doesn't think he's had a booster.  12:16 pm: [facility] can accept patient today. Will go to room 303. Rapid COVID test pending. Sent message to MD and RN to notify.  Expected Discharge Plan: Skilled Nursing Facility Barriers to Discharge: Continued Medical Work up  Expected Discharge Plan and Services Expected Discharge Plan: Skilled Nursing Facility In-house Referral: Clinical Social Work     Living arrangements for the past 2 months: Single Family Home                                       Social Determinants of Health (SDOH) Interventions    Readmission Risk Interventions No flowsheet data found.

## 2020-09-10 NOTE — Plan of Care (Signed)

## 2020-09-10 NOTE — Progress Notes (Signed)
PT Cancellation Note  Patient Details Name: Aaron Harvey MRN: [id number] DOB: Oct 22, 1955   Cancelled Treatment:    Reason Eval/Treat Not Completed: Other (comment). Pt sleeping in bed upon PT entry. Able to wake with verbal stimuli. Requests PT come back after lunch as he is tired. Will follow up with therapy intervention later today with time permitting.  Vira Blanco, PT, DPT 9:23 AM,09/10/20

## 2020-09-10 NOTE — Progress Notes (Signed)
SUBJECTIVE: Aaron Harvey is a 65 y.o. male with medical history significant of hypertension, hyperlipidemia, COPD (using as needed oxygen at night), GERD, gout, depression, anxiety, alcohol abuse, tobacco abuse, chronic back pain.   Patient denies chest pain and shortness of breath.    Vitals:   09/09/20 1950 09/10/20 0540 09/10/20 0724 09/10/20 0739  BP:  106/67 108/61   Pulse:  93 89   Resp:  19 (!) 24   Temp:  98.8 F (37.1 C) 98.9 F (37.2 C)   TempSrc:   Oral   SpO2: 94% (!) 85% 96% 94%  Weight:      Height:        Intake/Output Summary (Last 24 hours) at 09/10/2020 0815 Last data filed at 09/10/2020 0540 Gross per 24 hour  Intake 960 ml  Output 2950 ml  Net -1990 ml    LABS: Basic Metabolic Panel: Recent Labs    09/09/20 0503 09/10/20 0449  NA 129* 128*  K 3.8 3.9  CL 88* 91*  CO2 34* 32  GLUCOSE 121* 133*  BUN 20 21  CREATININE 1.10 0.96  CALCIUM 8.5* 8.5*  MG 2.0 2.2   Liver Function Tests: No results for input(s): AST, ALT, ALKPHOS, BILITOT, PROT, ALBUMIN in the last 72 hours. No results for input(s): LIPASE, AMYLASE in the last 72 hours. CBC: Recent Labs    09/08/20 0439 09/09/20 0503 09/10/20 0449  WBC 9.8 9.2 13.0*  NEUTROABS 7.5  --   --   HGB 9.0* 8.6* 8.4*  HCT 29.9* 28.5* 27.2*  MCV 80.4 82.1 80.0  PLT 361 352 364   Cardiac Enzymes: Recent Labs    09/08/20 0439  CKTOTAL 1,196*   BNP: Invalid input(s): POCBNP D-Dimer: No results for input(s): DDIMER in the last 72 hours. Hemoglobin A1C: No results for input(s): HGBA1C in the last 72 hours. Fasting Lipid Panel: No results for input(s): CHOL, HDL, LDLCALC, TRIG, CHOLHDL, LDLDIRECT in the last 72 hours. Thyroid Function Tests: No results for input(s): TSH, T4TOTAL, T3FREE, THYROIDAB in the last 72 hours.  Invalid input(s): FREET3 Anemia Panel: Recent Labs    09/07/20 1234  VITAMINB12 162*     PHYSICAL EXAM General: Well developed, well nourished, in no acute  distress HEENT:  Normocephalic and atramatic Neck:  No JVD.  Lungs: Clear bilaterally to auscultation and percussion. Heart: HRRR . Normal S1 and S2 without gallops or murmurs.  Abdomen: Bowel sounds are positive, abdomen soft and non-tender  Msk:  Back normal, normal gait. Normal strength and tone for age. Extremities: No clubbing, cyanosis or edema.   Neuro: Alert and oriented X 3. Psych:  Good affect, responds appropriately  TELEMETRY: NSR, HR 88  ASSESSMENT AND PLAN: Patient in atrial fibrillation, EKG ordered. Continue amiodarone and Cardizem.  Principal Problem:   Acute on chronic respiratory failure with hypoxia (HCC) Active Problems:   Hypertension   Acute urinary retention   Chronic pain syndrome   Gastroesophageal reflux disease without esophagitis   Tobacco abuse   Alcohol intoxication (HCC)   HLD (hyperlipidemia)   COPD exacerbation (HCC)   Fall   Sepsis (HCC)   Elevated troponin   Rhabdomyolysis   CAP (community acquired pneumonia)   Swelling of lower leg    Amber Scoggins, FNP-C 09/10/2020 8:15 AM

## 2020-09-11 LAB — CULTURE, BLOOD (ROUTINE X 2)
Culture: NO GROWTH
Culture: NO GROWTH

## 2020-09-13 DIAGNOSIS — I4891 Unspecified atrial fibrillation: Secondary | ICD-10-CM | POA: Diagnosis not present

## 2020-09-13 DIAGNOSIS — I509 Heart failure, unspecified: Secondary | ICD-10-CM | POA: Diagnosis not present

## 2020-09-13 DIAGNOSIS — Z4789 Encounter for other orthopedic aftercare: Secondary | ICD-10-CM | POA: Diagnosis not present

## 2020-09-13 DIAGNOSIS — J449 Chronic obstructive pulmonary disease, unspecified: Secondary | ICD-10-CM | POA: Diagnosis not present

## 2020-09-16 DIAGNOSIS — Z4789 Encounter for other orthopedic aftercare: Secondary | ICD-10-CM | POA: Diagnosis not present

## 2020-09-16 DIAGNOSIS — J449 Chronic obstructive pulmonary disease, unspecified: Secondary | ICD-10-CM | POA: Diagnosis not present

## 2020-09-16 DIAGNOSIS — R270 Ataxia, unspecified: Secondary | ICD-10-CM | POA: Diagnosis not present

## 2020-09-16 DIAGNOSIS — I4891 Unspecified atrial fibrillation: Secondary | ICD-10-CM | POA: Diagnosis not present

## 2020-09-22 DIAGNOSIS — Z9889 Other specified postprocedural states: Secondary | ICD-10-CM | POA: Diagnosis not present

## 2020-09-22 DIAGNOSIS — Z8781 Personal history of (healed) traumatic fracture: Secondary | ICD-10-CM | POA: Diagnosis not present

## 2020-09-22 DIAGNOSIS — S82251K Displaced comminuted fracture of shaft of right tibia, subsequent encounter for closed fracture with nonunion: Secondary | ICD-10-CM | POA: Diagnosis not present

## 2020-09-27 DIAGNOSIS — I10 Essential (primary) hypertension: Secondary | ICD-10-CM | POA: Diagnosis not present

## 2020-09-27 DIAGNOSIS — G894 Chronic pain syndrome: Secondary | ICD-10-CM | POA: Diagnosis not present

## 2020-09-27 DIAGNOSIS — I4891 Unspecified atrial fibrillation: Secondary | ICD-10-CM | POA: Diagnosis not present

## 2020-09-28 DIAGNOSIS — Z9981 Dependence on supplemental oxygen: Secondary | ICD-10-CM | POA: Diagnosis not present

## 2020-10-07 DIAGNOSIS — S82251K Displaced comminuted fracture of shaft of right tibia, subsequent encounter for closed fracture with nonunion: Secondary | ICD-10-CM | POA: Diagnosis not present

## 2020-10-08 DIAGNOSIS — R404 Transient alteration of awareness: Secondary | ICD-10-CM | POA: Diagnosis not present

## 2020-10-08 DIAGNOSIS — I499 Cardiac arrhythmia, unspecified: Secondary | ICD-10-CM | POA: Diagnosis not present

## 2020-10-08 DIAGNOSIS — R410 Disorientation, unspecified: Secondary | ICD-10-CM | POA: Diagnosis not present

## 2020-10-08 DIAGNOSIS — R4020 Unspecified coma: Secondary | ICD-10-CM | POA: Diagnosis not present

## 2020-10-08 DIAGNOSIS — Z743 Need for continuous supervision: Secondary | ICD-10-CM | POA: Diagnosis not present

## 2020-10-13 ENCOUNTER — Ambulatory Visit: Payer: Medicare Other | Admitting: Family Medicine

## 2020-10-27 ENCOUNTER — Telehealth: Payer: Self-pay

## 2020-10-27 NOTE — Telephone Encounter (Signed)
Copied from CRM [id number]. Topic: General - Deceased Patient >> 2020-10-29  9:32 AM Daphine Deutscher D wrote: Reason for CRM: April with [facility] called wanting to know if Dr. Kirtland Bouchard will sign this patients death cert.  [phone]

## 2020-10-27 NOTE — Telephone Encounter (Signed)
April with [facility] called again to inquire if Dr. Kirtland Bouchard will sign this patients death certificate and states that if Dr Kirtland Bouchard is not comfy with signing this please call and let them know since patient was only seen once by Dr Kirtland Bouchard. April can be reached at Ph#  [phone]

## 2020-10-28 NOTE — Telephone Encounter (Signed)
I would not be comfortable signing the death certificate for this patient since I have only had 1 visit to establish with him.  If you can please let them know to make other arrangements if possible as they suggested in this message.  Saralyn Pilar, DO [facility] 10/28/2020, 12:22 PM

## 2020-10-28 NOTE — Telephone Encounter (Signed)
I spoke with April at the funeral home letting them know that Dr. Kirtland Bouchard will not be able to sign the death certificate. She will make the medical examiner aware.

## 2020-10-29 DIAGNOSIS — J449 Chronic obstructive pulmonary disease, unspecified: Secondary | ICD-10-CM | POA: Diagnosis not present

## 2020-11-01 ENCOUNTER — Telehealth: Payer: Self-pay

## 2020-11-01 NOTE — Telephone Encounter (Signed)
Copied from CRM [id number]. Topic: General - Other >> Nov 01, 2020  9:52 AM Jaquita Rector A wrote: Reason for CRM: Jennette Kettle with [facility] called in to inform Dr Kirtland Bouchard that the ME said since Dr Kirtland Bouchard saw the patient at least once he have to be the one that sign the Death certificate which have been uploaded in the portal at  Case# [id number] on the portal  Ph# [phone]

## 2020-11-03 NOTE — Telephone Encounter (Signed)
Alright. I will sign the Death Certificate, however I will need information in order to complete it.  I am unaware of the circumstances of this patient's passing.  I see the date of death on the form, but I do not know approximate or exact time of death or what the circumstances of his death were.  I can try to contact the funeral home perhaps tomorrow if you can get this information that is helpful.  Knowing the immediate medical history surrounding the circumstances leading up to his death will help me complete the death certificate.  Saralyn Pilar, DO [facility] 11/03/2020, 6:24 PM

## 2021-11-21 IMAGING — DX DG KNEE 1-2V*L*
2 series · 2 of 2 positions shown · non-contrast
Comparison: None.

CLINICAL DATA: Bilateral knee pain

EXAM:
LEFT KNEE - 1-2 VIEW

[knee ap]
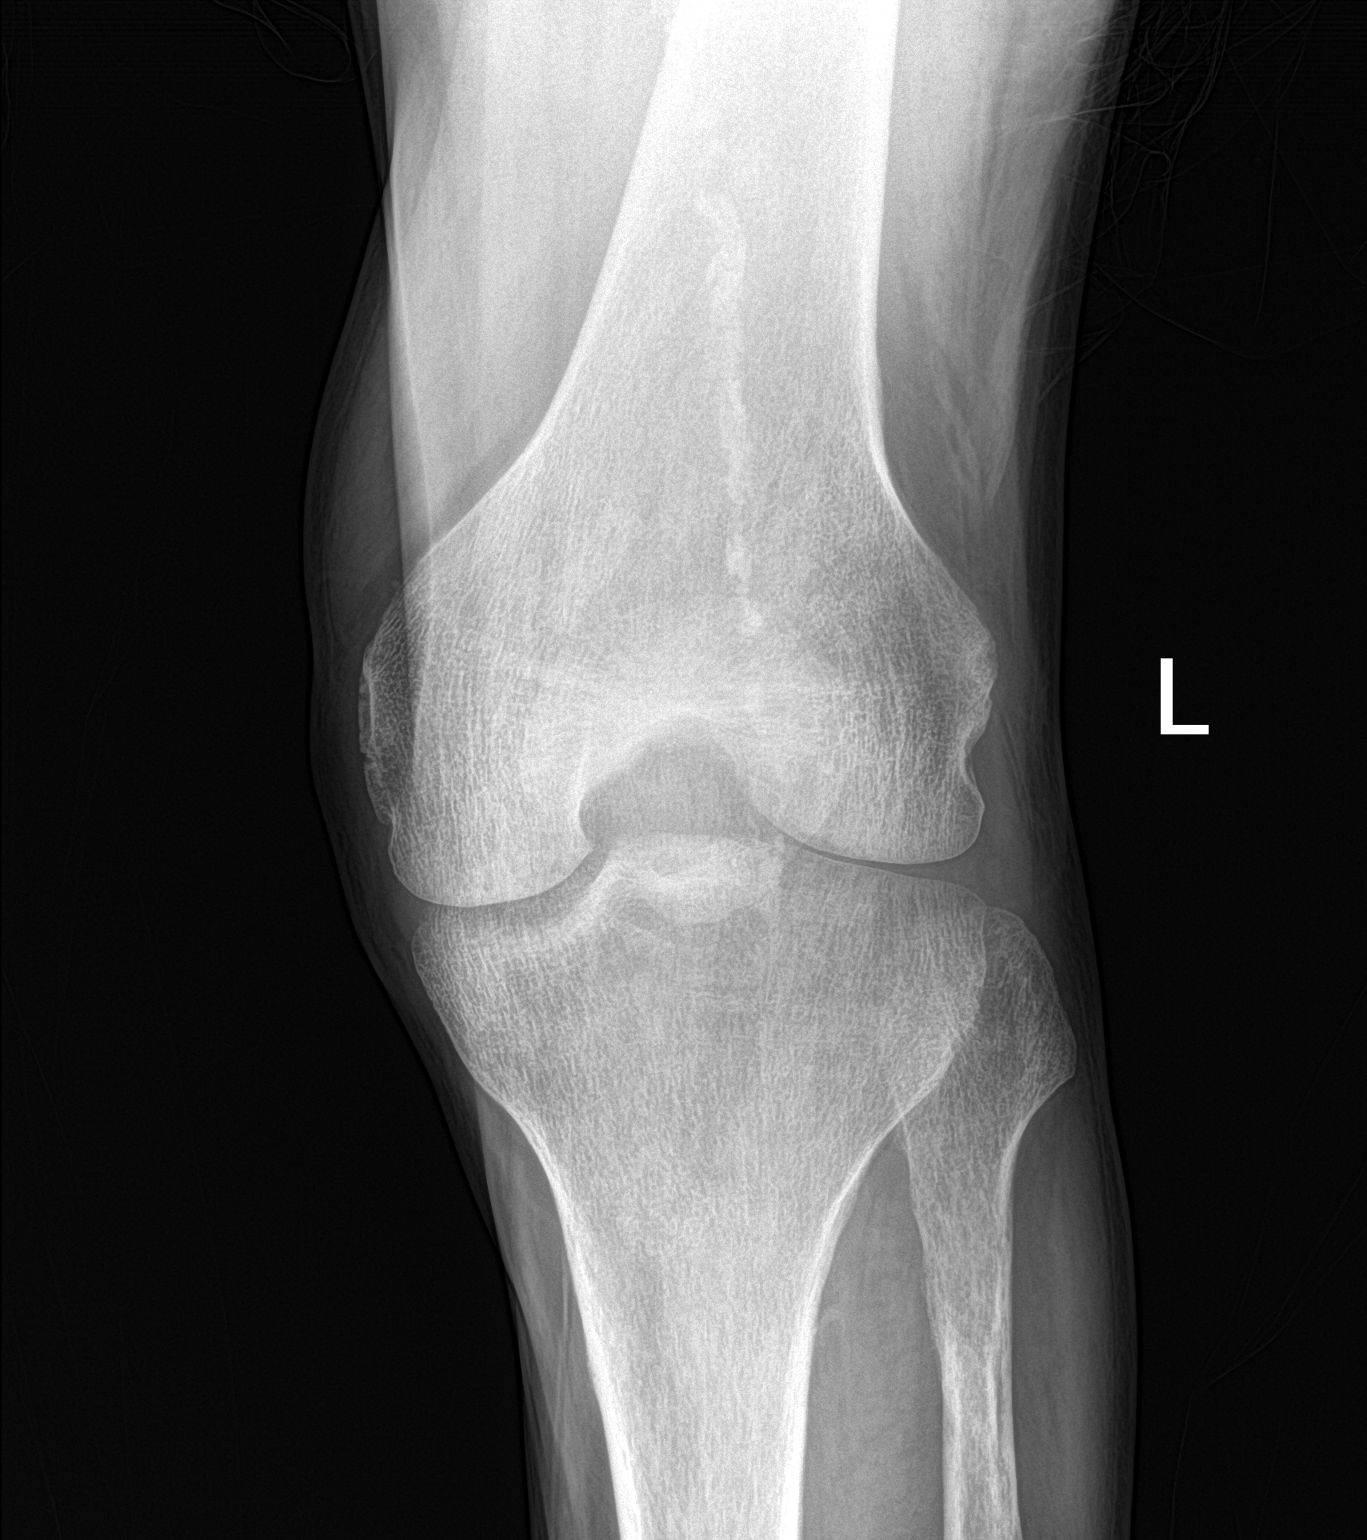

[knee lat]
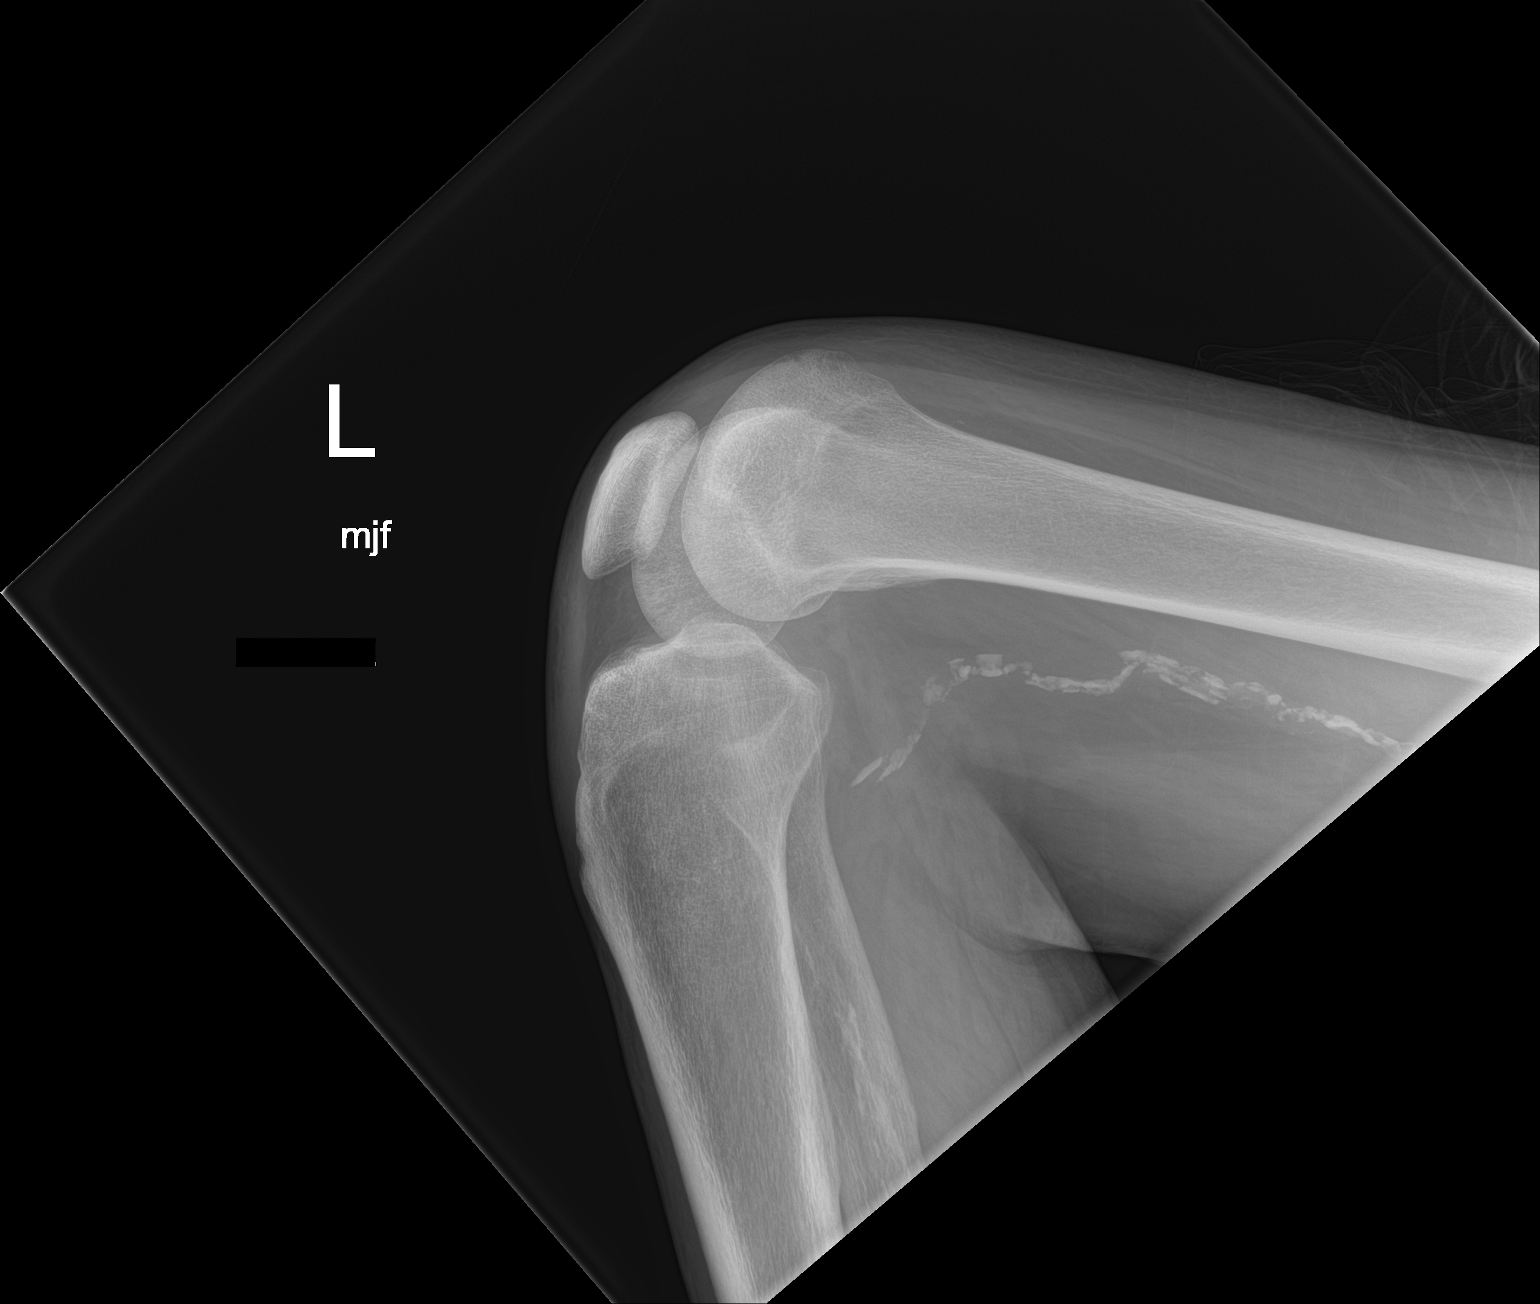

[2 of 2 positions shown; findings below may reference images not displayed]

FINDINGS: No fracture or dislocation is seen.

The joint spaces are preserved.

Visualized soft tissues are within normal limits.

No suprapatellar knee joint effusion.
IMPRESSION: Negative.

## 2021-11-21 IMAGING — DX DG KNEE 1-2V*R*
2 series · 2 of 2 positions shown · non-contrast
Comparison: Right tib-fib radiographs dated 06/08/2020

CLINICAL DATA: Bilateral knee pain

EXAM:
RIGHT KNEE - 1-2 VIEW

[knee ap]
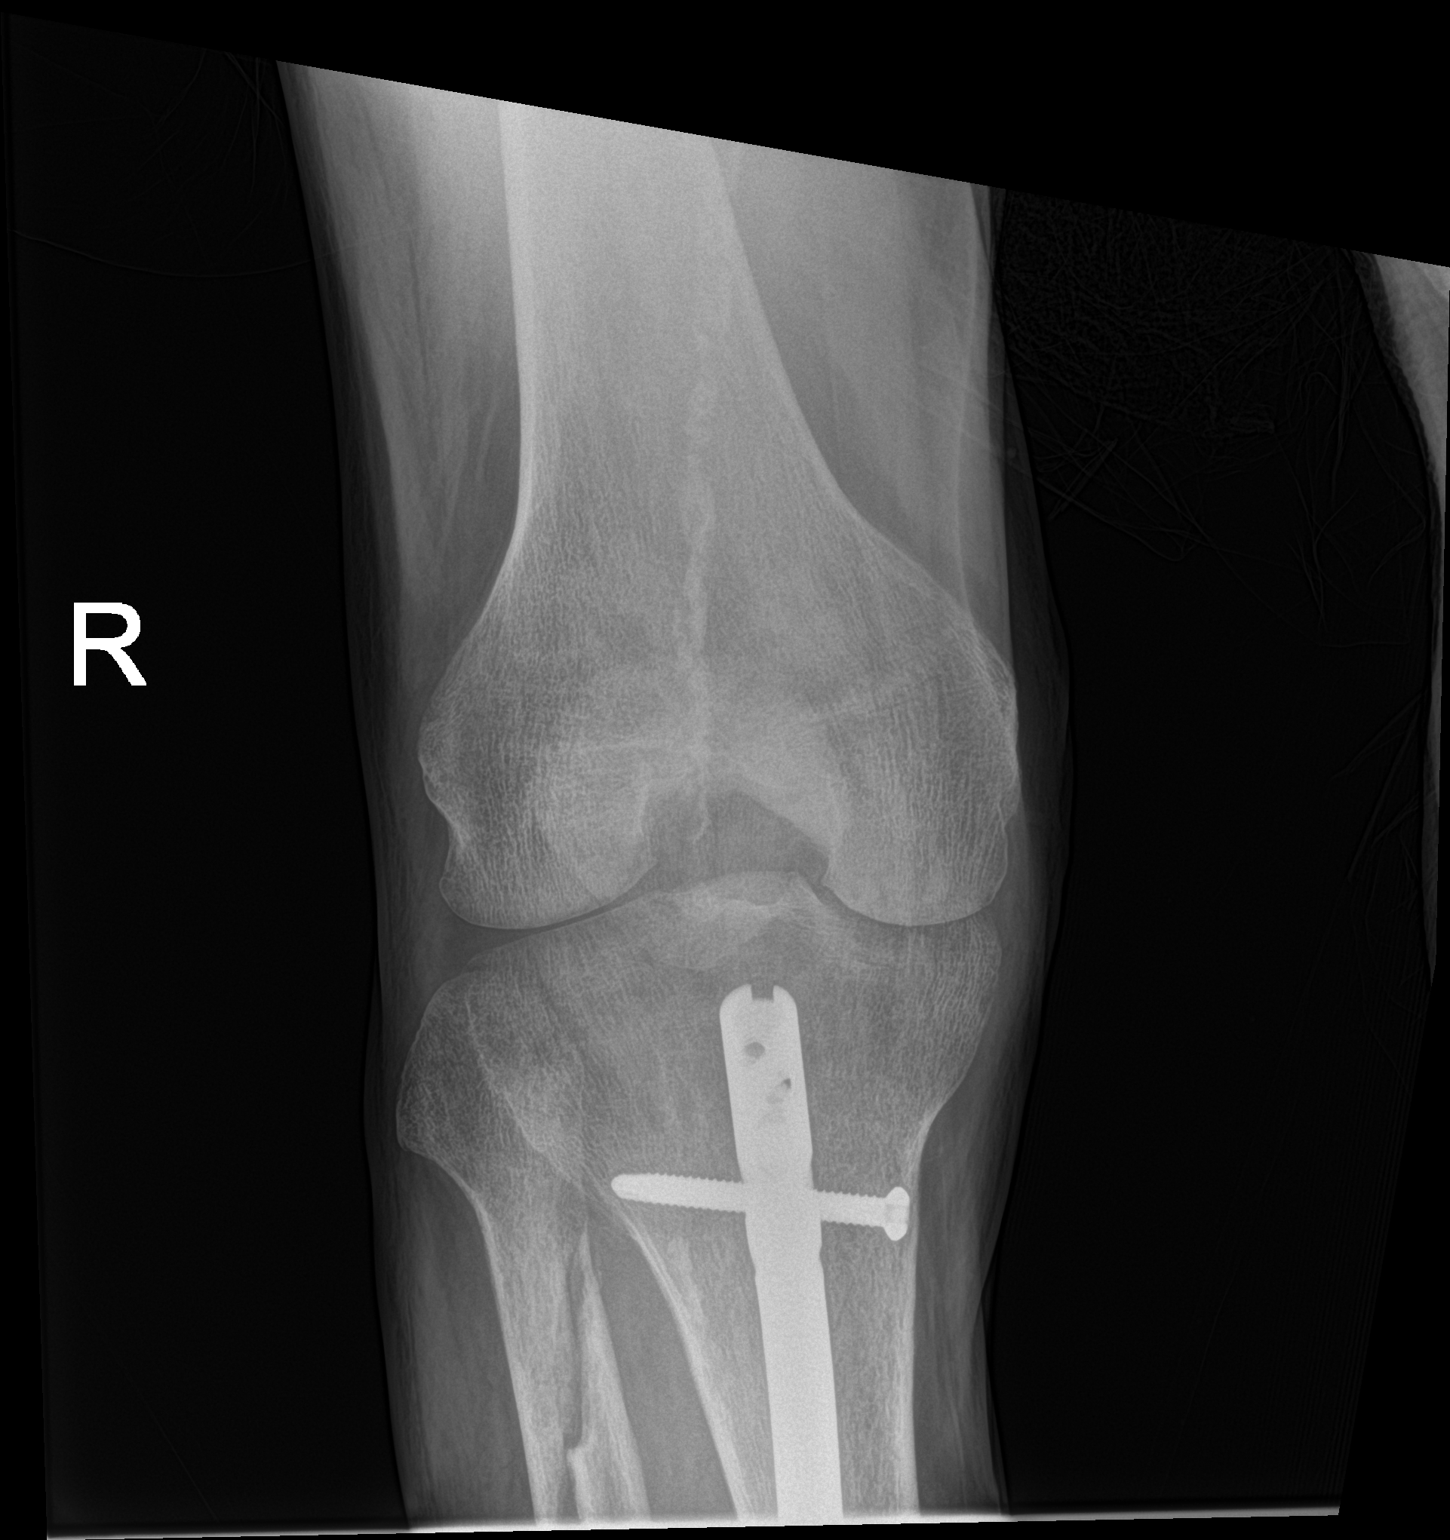

[knee lat]
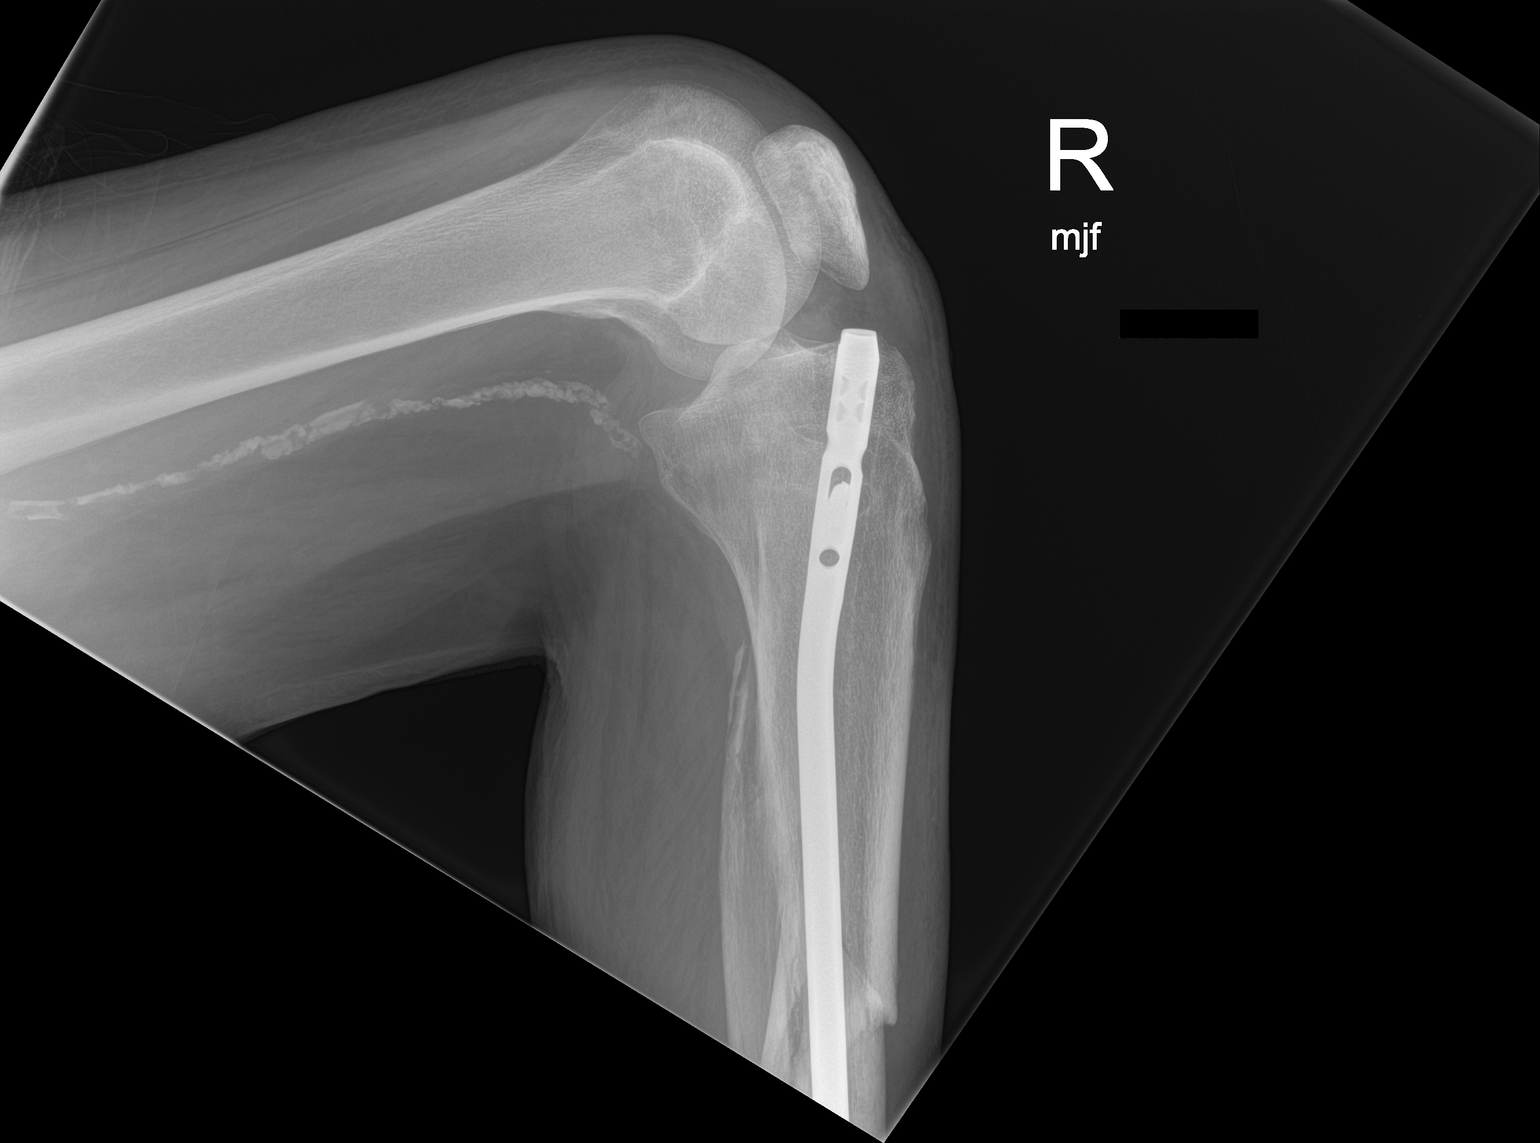

[2 of 2 positions shown; findings below may reference images not displayed]

FINDINGS: IM nail fixation of a mid tibial shaft fracture, incompletely
visualized.

Proximal fibular shaft fracture, incompletely visualized.

Both fractures remain visible, without significant interval healing.

Knee joints are preserved.

Visualized soft tissues are within normal limits.

No suprapatellar knee joint effusion.
IMPRESSION: Status post ORIF of a tibial shaft fracture, incompletely
visualized. Proximal fibular shaft fracture, incompletely
visualized. Fracture lucencies remain visible, without significant
interval healing.
